# Patient Record
Sex: Female | Born: 1980 | Race: Asian | Hispanic: No | Marital: Married | Smoking: Never smoker
Health system: Southern US, Community
[De-identification: ages and names within clinical notes are randomized; demographics above are authoritative.]

## PROBLEM LIST (undated history)

## (undated) ENCOUNTER — Emergency Department (HOSPITAL_COMMUNITY): Admission: EM | Payer: BC Managed Care – PPO

## (undated) DIAGNOSIS — T783XXA Angioneurotic edema, initial encounter: Secondary | ICD-10-CM

## (undated) DIAGNOSIS — D219 Benign neoplasm of connective and other soft tissue, unspecified: Secondary | ICD-10-CM

## (undated) DIAGNOSIS — L309 Dermatitis, unspecified: Secondary | ICD-10-CM

## (undated) DIAGNOSIS — K8020 Calculus of gallbladder without cholecystitis without obstruction: Secondary | ICD-10-CM

## (undated) DIAGNOSIS — N939 Abnormal uterine and vaginal bleeding, unspecified: Secondary | ICD-10-CM

## (undated) DIAGNOSIS — T7840XA Allergy, unspecified, initial encounter: Secondary | ICD-10-CM

## (undated) DIAGNOSIS — E079 Disorder of thyroid, unspecified: Secondary | ICD-10-CM

## (undated) DIAGNOSIS — L509 Urticaria, unspecified: Secondary | ICD-10-CM

## (undated) HISTORY — PX: PELVIC FRACTURE SURGERY: SHX119

## (undated) HISTORY — DX: Angioneurotic edema, initial encounter: T78.3XXA

## (undated) HISTORY — DX: Urticaria, unspecified: L50.9

## (undated) HISTORY — PX: FRACTURE SURGERY: SHX138

## (undated) HISTORY — DX: Dermatitis, unspecified: L30.9

## (undated) HISTORY — DX: Allergy, unspecified, initial encounter: T78.40XA

## (undated) HISTORY — DX: Benign neoplasm of connective and other soft tissue, unspecified: D21.9

## (undated) HISTORY — DX: Abnormal uterine and vaginal bleeding, unspecified: N93.9

## (undated) HISTORY — DX: Disorder of thyroid, unspecified: E07.9

---

## 2000-06-19 HISTORY — PX: PELVIC FRACTURE SURGERY: SHX119

## 2013-09-01 ENCOUNTER — Emergency Department (HOSPITAL_COMMUNITY): Payer: Self-pay

## 2013-09-01 ENCOUNTER — Encounter (HOSPITAL_COMMUNITY): Payer: Self-pay | Admitting: Emergency Medicine

## 2013-09-01 ENCOUNTER — Emergency Department (HOSPITAL_COMMUNITY)
Admission: EM | Admit: 2013-09-01 | Discharge: 2013-09-01 | Disposition: A | Discharge status: Home / Self Care | Payer: Self-pay | Attending: Emergency Medicine | Admitting: Emergency Medicine

## 2013-09-01 DIAGNOSIS — Z3202 Encounter for pregnancy test, result negative: Secondary | ICD-10-CM | POA: Insufficient documentation

## 2013-09-01 DIAGNOSIS — K8020 Calculus of gallbladder without cholecystitis without obstruction: Secondary | ICD-10-CM | POA: Insufficient documentation

## 2013-09-01 DIAGNOSIS — Z9889 Other specified postprocedural states: Secondary | ICD-10-CM | POA: Insufficient documentation

## 2013-09-01 DIAGNOSIS — Z79899 Other long term (current) drug therapy: Secondary | ICD-10-CM | POA: Insufficient documentation

## 2013-09-01 LAB — CBC WITH DIFFERENTIAL/PLATELET
BASOS ABS: 0 10*3/uL (ref 0.0–0.1)
Basophils Relative: 0 % (ref 0–1)
Eosinophils Absolute: 0.1 10*3/uL (ref 0.0–0.7)
Eosinophils Relative: 0 % (ref 0–5)
HEMATOCRIT: 38.8 % (ref 36.0–46.0)
Hemoglobin: 12.8 g/dL (ref 12.0–15.0)
LYMPHS PCT: 25 % (ref 12–46)
Lymphs Abs: 3.3 10*3/uL (ref 0.7–4.0)
MCH: 27.8 pg (ref 26.0–34.0)
MCHC: 33 g/dL (ref 30.0–36.0)
MCV: 84.2 fL (ref 78.0–100.0)
MONO ABS: 0.6 10*3/uL (ref 0.1–1.0)
Monocytes Relative: 4 % (ref 3–12)
Neutro Abs: 9.1 10*3/uL — ABNORMAL HIGH (ref 1.7–7.7)
Neutrophils Relative %: 70 % (ref 43–77)
PLATELETS: 328 10*3/uL (ref 150–400)
RBC: 4.61 MIL/uL (ref 3.87–5.11)
RDW: 13.4 % (ref 11.5–15.5)
WBC: 13 10*3/uL — ABNORMAL HIGH (ref 4.0–10.5)

## 2013-09-01 LAB — COMPREHENSIVE METABOLIC PANEL
ALT: 18 U/L (ref 0–35)
AST: 17 U/L (ref 0–37)
Albumin: 4 g/dL (ref 3.5–5.2)
Alkaline Phosphatase: 60 U/L (ref 39–117)
BUN: 9 mg/dL (ref 6–23)
CO2: 28 mEq/L (ref 19–32)
Calcium: 9.7 mg/dL (ref 8.4–10.5)
Chloride: 102 mEq/L (ref 96–112)
Creatinine, Ser: 0.6 mg/dL (ref 0.50–1.10)
GFR calc Af Amer: >90 mL/min (ref >90)
GFR calc non Af Amer: >90 mL/min (ref >90)
Glucose, Bld: 120 mg/dL — ABNORMAL HIGH (ref 70–99)
POTASSIUM: 3.8 meq/L (ref 3.7–5.3)
Sodium: 140 mEq/L (ref 137–147)
TOTAL PROTEIN: 8.3 g/dL (ref 6.0–8.3)
Total Bilirubin: 0.3 mg/dL (ref 0.3–1.2)

## 2013-09-01 LAB — URINALYSIS, ROUTINE W REFLEX MICROSCOPIC
Bilirubin Urine: NEGATIVE
Glucose, UA: NEGATIVE mg/dL
Ketones, ur: NEGATIVE mg/dL
LEUKOCYTES UA: NEGATIVE
NITRITE: NEGATIVE
PROTEIN: NEGATIVE mg/dL
Specific Gravity, Urine: <1.005 — ABNORMAL LOW (ref 1.005–1.030)
UROBILINOGEN UA: 0.2 mg/dL (ref 0.0–1.0)
pH: 7 (ref 5.0–8.0)

## 2013-09-01 LAB — URINE MICROSCOPIC-ADD ON

## 2013-09-01 LAB — POC URINE PREG, ED: PREG TEST UR: NEGATIVE

## 2013-09-01 LAB — LIPASE, BLOOD: LIPASE: 24 U/L (ref 11–59)

## 2013-09-01 MED ORDER — MORPHINE SULFATE 4 MG/ML IJ SOLN
4.0000 mg | Freq: Once | INTRAMUSCULAR | Status: AC
  Administered 2013-09-01: 4 mg via INTRAVENOUS
  Filled 2013-09-01: qty 1

## 2013-09-01 MED ORDER — PANTOPRAZOLE SODIUM 40 MG IV SOLR
40.0000 mg | Freq: Once | INTRAVENOUS | Status: AC
  Administered 2013-09-01: 40 mg via INTRAVENOUS
  Filled 2013-09-01: qty 40

## 2013-09-01 MED ORDER — ONDANSETRON 8 MG PO TBDP
8.0000 mg | ORAL_TABLET | Freq: Once | ORAL | Status: AC
  Administered 2013-09-01: 8 mg via ORAL
  Filled 2013-09-01: qty 1

## 2013-09-01 MED ORDER — ONDANSETRON HCL 8 MG PO TABS: 8.0000 mg | ORAL_TABLET | Freq: Three times a day (TID) | ORAL | Status: DC | PRN

## 2013-09-01 MED ORDER — SODIUM CHLORIDE 0.9 % IV BOLUS (SEPSIS)
500.0000 mL | Freq: Once | INTRAVENOUS | Status: AC
  Administered 2013-09-01: 500 mL via INTRAVENOUS

## 2013-09-01 MED ORDER — OXYCODONE-ACETAMINOPHEN 5-325 MG PO TABS: 1.0000 | ORAL_TABLET | ORAL | Status: DC | PRN

## 2013-09-01 MED ORDER — CIPROFLOXACIN HCL 500 MG PO TABS: 500.0000 mg | ORAL_TABLET | Freq: Two times a day (BID) | ORAL | Status: DC

## 2013-09-01 NOTE — Care Management Note (Signed)
ED/CM noted patient did not have health insurance and/or PCP listed in the computer.  Patient was given the [place] resource handout with information on the clinics, food pantries, and the handout for new health insurance sign-up.  Patient expressed appreciation for information received. 

## 2013-09-01 NOTE — ED Notes (Signed)
Pt c/o intermittent mid-upper abdominal pain along with nausea and vomiting x2 weeks. Pt describes pain as sharp.

## 2013-09-01 NOTE — ED Provider Notes (Signed)
CSN: [id number]     Arrival date & time 09/01/13  1057 History   This chart was scribed for Traci Cable, MD by Era Bumpers, ED scribe. This patient was seen in room APA08/APA08 and the patient's care was started at 1057.  Chief Complaint  Patient presents with  . Abdominal Pain   The history is provided by the patient. No language interpreter was used.   HPI Comments: Traci Schmidt is a 33 y.o. female who presents to the Emergency Department for intermittent epigastric abdominal pain, ongoing for x15 days but worsened over the last x2 days. She states her abdominal pain is worse during the night. She states pain is not worse after eating food. She reports associated emesis and back pain. She denies any hx of Gallbladder issues. She states no relief w/nexium. She denies any blood in stool or vomitus, denies SOB, dysuria, vaginal bleeding or d/c. She reports baseline constipation.  No medical hx  PMH - none  Past Surgical History  Procedure Laterality Date  . Cesarean section    . Pelvic fracture surgery     History reviewed. No pertinent family history. History  Substance Use Topics  . Smoking status: Never Smoker   . Smokeless tobacco: Not on file  . Alcohol Use: No   OB History   Grav Para Term Preterm Abortions TAB SAB Ect Mult Living                 Review of Systems  Constitutional: Negative for fever and chills.  Respiratory: Negative for cough and shortness of breath.   Cardiovascular: Negative for chest pain.  Gastrointestinal: Positive for nausea, vomiting and abdominal pain. Negative for diarrhea.  Genitourinary: Negative for vaginal bleeding and vaginal discharge.  Musculoskeletal: Negative for back pain.  All other systems reviewed and are negative.   Allergies  Review of patient's allergies indicates no known allergies.  Home Medications   Current Outpatient Rx  Name  Route  Sig  Dispense  Refill  . acetaminophen (TYLENOL) 500 MG tablet   Oral  Take 1,000 mg by mouth every 6 (six) hours as needed for moderate pain or headache.         . calcium carbonate (TUMS - DOSED IN MG ELEMENTAL CALCIUM) 500 MG chewable tablet   Oral   Chew 1 tablet by mouth 4 (four) times daily as needed for indigestion or heartburn.         . esomeprazole (NEXIUM) 20 MG capsule   Oral   Take 20 mg by mouth daily at 12 noon.         Marland Kitchen ibuprofen (ADVIL,MOTRIN) 200 MG tablet   Oral   Take 400 mg by mouth daily as needed for headache or moderate pain.         . Simethicone (GAS RELIEF PO)   Oral   Take 1 tablet by mouth 3 (three) times daily as needed (gas relief).         . ciprofloxacin (CIPRO) 500 MG tablet   Oral   Take 1 tablet (500 mg total) by mouth 2 (two) times daily. One po bid x 7 days   14 tablet   0   . ondansetron (ZOFRAN) 8 MG tablet   Oral   Take 1 tablet (8 mg total) by mouth every 8 (eight) hours as needed for nausea.   12 tablet   0   . oxyCODONE-acetaminophen (PERCOCET/ROXICET) 5-325 MG per tablet   Oral   Take 1  tablet by mouth every 4 (four) hours as needed for severe pain.   15 tablet   0     Triage Vitals: BP 137/88  Pulse 93  Temp(Src) 98.5 F (36.9 C) (Oral)  Resp 20  Ht 5\' 3"  (1.6 m)  Wt 159 lb (72.122 kg)  BMI 28.17 kg/m2  SpO2 97%  LMP 08/03/2013  Physical Exam CONSTITUTIONAL: Well developed/well nourished HEAD: Normocephalic/atraumatic EYES: EOMI/PERRL. No icterus ENMT: Mucous membranes moist NECK: supple no meningeal signs SPINE:entire spine nontender CV: S1/S2 noted, no murmurs/rubs/gallops noted LUNGS: Lungs are clear to auscultation bilaterally, no apparent distress ABDOMEN: soft, , no rebound or guarding. Moderate epigastric tenderness to palpation GU:no cva tenderness NEURO: Pt is awake/alert, moves all extremitiesx4 EXTREMITIES: pulses normal, full ROM SKIN: warm, color normal PSYCH: no abnormalities of mood noted  ED Course  Procedures  DIAGNOSTIC STUDIES: Oxygen  Saturation is 97% on room air, normal by my interpretation.    COORDINATION OF CARE: At 9323 AM Discussed treatment plan with patient which includes abdominal US. Patient agrees.   D/w dr Arnoldo Morale He reviewed chart/labs Since pt is improved, recommends pain meds, cipro for "elevated white count" and f/u in office this week for cholelithiasis  Pt/family agreeable with this plan We discussed strict return precautions   Labs Review Labs Reviewed  COMPREHENSIVE METABOLIC PANEL - Abnormal; Notable for the following:    Glucose, Bld 120 (*)    All other components within normal limits  CBC WITH DIFFERENTIAL - Abnormal; Notable for the following:    WBC 13.0 (*)    Neutro Abs 9.1 (*)    All other components within normal limits  URINALYSIS, ROUTINE W REFLEX MICROSCOPIC - Abnormal; Notable for the following:    Specific Gravity, Urine <1.005 (*)    Hgb urine dipstick TRACE (*)    All other components within normal limits  URINE MICROSCOPIC-ADD ON - Abnormal; Notable for the following:    Squamous Epithelial / LPF FEW (*)    Bacteria, UA FEW (*)    All other components within normal limits  LIPASE, BLOOD  POC URINE PREG, ED   Imaging Review US Abdomen Limited  09/01/2013   CLINICAL DATA:  Mid epigastric pain, right upper quadrant pain.  EXAM: US ABDOMEN LIMITED - RIGHT UPPER QUADRANT  COMPARISON:  None.  FINDINGS: Gallbladder:  Multiple multiple gallstones noted. Mild gallbladder wall thickening at 4 mm. Negative sonographic Murphy's sign. No pericholecystic fluid.  Common bile duct:  Diameter: Normal caliber, 4 mm  Liver:  No focal lesion identified. Within normal limits in parenchymal echogenicity.  IMPRESSION: Cholelithiasis. Slight gallbladder wall thickening could reflect chronic cholecystitis. No sonographic evidence of acute cholecystitis.   Electronically Signed   By: Rolm Baptise M.D.   On: 09/01/2013 12:25     MDM   Final diagnoses:  Cholelithiasis    Nursing notes  including past medical history and social history reviewed and considered in documentation Labs/vital reviewed and considered   I personally performed the services described in this documentation, which was scribed in my presence. The recorded information has been reviewed and is accurate.      Traci Cable, MD 09/01/13 [phone]

## 2013-09-01 NOTE — ED Notes (Signed)
Pt reports intermittent sharp epigastric pain x 2. States that episodes have become more intense and frequent over last couple days. Also reports vomiting, denies diarrhea and black or bloody stools.

## 2013-09-01 NOTE — Discharge Instructions (Signed)

## 2013-10-31 ENCOUNTER — Encounter (HOSPITAL_COMMUNITY): Payer: Self-pay | Admitting: Emergency Medicine

## 2013-10-31 ENCOUNTER — Emergency Department (HOSPITAL_COMMUNITY)
Admission: EM | Admit: 2013-10-31 | Discharge: 2013-10-31 | Disposition: A | Discharge status: Home / Self Care | Payer: Self-pay | Attending: Emergency Medicine | Admitting: Emergency Medicine

## 2013-10-31 DIAGNOSIS — Z3202 Encounter for pregnancy test, result negative: Secondary | ICD-10-CM | POA: Insufficient documentation

## 2013-10-31 DIAGNOSIS — K8020 Calculus of gallbladder without cholecystitis without obstruction: Secondary | ICD-10-CM | POA: Insufficient documentation

## 2013-10-31 DIAGNOSIS — Z79899 Other long term (current) drug therapy: Secondary | ICD-10-CM | POA: Insufficient documentation

## 2013-10-31 DIAGNOSIS — Z792 Long term (current) use of antibiotics: Secondary | ICD-10-CM | POA: Insufficient documentation

## 2013-10-31 DIAGNOSIS — R1110 Vomiting, unspecified: Secondary | ICD-10-CM | POA: Insufficient documentation

## 2013-10-31 DIAGNOSIS — K8050 Calculus of bile duct without cholangitis or cholecystitis without obstruction: Secondary | ICD-10-CM

## 2013-10-31 DIAGNOSIS — Z791 Long term (current) use of non-steroidal anti-inflammatories (NSAID): Secondary | ICD-10-CM | POA: Insufficient documentation

## 2013-10-31 HISTORY — DX: Calculus of gallbladder without cholecystitis without obstruction: K80.20

## 2013-10-31 LAB — BASIC METABOLIC PANEL
BUN: 10 mg/dL (ref 6–23)
CHLORIDE: 103 meq/L (ref 96–112)
CO2: 26 mEq/L (ref 19–32)
Calcium: 9 mg/dL (ref 8.4–10.5)
Creatinine, Ser: 0.66 mg/dL (ref 0.50–1.10)
GFR calc Af Amer: >90 mL/min (ref >90)
GFR calc non Af Amer: >90 mL/min (ref >90)
GLUCOSE: 113 mg/dL — AB (ref 70–99)
POTASSIUM: 4.2 meq/L (ref 3.7–5.3)
Sodium: 140 mEq/L (ref 137–147)

## 2013-10-31 LAB — CBC WITH DIFFERENTIAL/PLATELET
BASOS ABS: 0 10*3/uL (ref 0.0–0.1)
Basophils Relative: 0 % (ref 0–1)
EOS ABS: 0.1 10*3/uL (ref 0.0–0.7)
Eosinophils Relative: 1 % (ref 0–5)
HEMATOCRIT: 36.8 % (ref 36.0–46.0)
Hemoglobin: 12.1 g/dL (ref 12.0–15.0)
LYMPHS ABS: 3.5 10*3/uL (ref 0.7–4.0)
Lymphocytes Relative: 30 % (ref 12–46)
MCH: 27.7 pg (ref 26.0–34.0)
MCHC: 32.9 g/dL (ref 30.0–36.0)
MCV: 84.2 fL (ref 78.0–100.0)
Monocytes Absolute: 0.4 10*3/uL (ref 0.1–1.0)
Monocytes Relative: 3 % (ref 3–12)
Neutro Abs: 7.9 10*3/uL — ABNORMAL HIGH (ref 1.7–7.7)
Neutrophils Relative %: 66 % (ref 43–77)
PLATELETS: 323 10*3/uL (ref 150–400)
RBC: 4.37 MIL/uL (ref 3.87–5.11)
RDW: 13.3 % (ref 11.5–15.5)
WBC: 11.9 10*3/uL — ABNORMAL HIGH (ref 4.0–10.5)

## 2013-10-31 LAB — URINALYSIS, ROUTINE W REFLEX MICROSCOPIC
BILIRUBIN URINE: NEGATIVE
Glucose, UA: NEGATIVE mg/dL
Hgb urine dipstick: NEGATIVE
KETONES UR: NEGATIVE mg/dL
Leukocytes, UA: NEGATIVE
NITRITE: NEGATIVE
PH: 6.5 (ref 5.0–8.0)
Protein, ur: NEGATIVE mg/dL
Specific Gravity, Urine: 1.025 (ref 1.005–1.030)
Urobilinogen, UA: 0.2 mg/dL (ref 0.0–1.0)

## 2013-10-31 LAB — HEPATIC FUNCTION PANEL
ALBUMIN: 3.5 g/dL (ref 3.5–5.2)
ALK PHOS: 55 U/L (ref 39–117)
ALT: 20 U/L (ref 0–35)
AST: 17 U/L (ref 0–37)
BILIRUBIN TOTAL: 0.2 mg/dL — AB (ref 0.3–1.2)
Total Protein: 7.4 g/dL (ref 6.0–8.3)

## 2013-10-31 LAB — LIPASE, BLOOD: Lipase: 35 U/L (ref 11–59)

## 2013-10-31 LAB — PREGNANCY, URINE: PREG TEST UR: NEGATIVE

## 2013-10-31 MED ORDER — ONDANSETRON 4 MG PO TBDP: 4.0000 mg | ORAL_TABLET | Freq: Three times a day (TID) | ORAL | Status: DC | PRN

## 2013-10-31 MED ORDER — ONDANSETRON HCL 4 MG/2ML IJ SOLN
4.0000 mg | Freq: Once | INTRAMUSCULAR | Status: AC
  Administered 2013-10-31: 4 mg via INTRAVENOUS
  Filled 2013-10-31: qty 2

## 2013-10-31 MED ORDER — SODIUM CHLORIDE 0.9 % IV BOLUS (SEPSIS)
1000.0000 mL | Freq: Once | INTRAVENOUS | Status: AC
Start: 2013-10-31 — End: 2013-10-31
  Administered 2013-10-31: 1000 mL via INTRAVENOUS

## 2013-10-31 MED ORDER — SODIUM CHLORIDE 0.9 % IV SOLN
1000.0000 mL | Freq: Once | INTRAVENOUS | Status: AC
  Administered 2013-10-31: 1000 mL via INTRAVENOUS

## 2013-10-31 MED ORDER — OXYCODONE-ACETAMINOPHEN 5-325 MG PO TABS: 2.0000 | ORAL_TABLET | ORAL | Status: DC | PRN

## 2013-10-31 MED ORDER — HYDROMORPHONE HCL PF 1 MG/ML IJ SOLN
1.0000 mg | INTRAMUSCULAR | Status: AC | PRN
  Administered 2013-10-31 (×2): 1 mg via INTRAVENOUS
  Filled 2013-10-31 (×2): qty 1

## 2013-10-31 MED ORDER — MORPHINE SULFATE 4 MG/ML IJ SOLN
4.0000 mg | Freq: Once | INTRAMUSCULAR | Status: AC
  Administered 2013-10-31: 4 mg via INTRAVENOUS
  Filled 2013-10-31: qty 1

## 2013-10-31 NOTE — ED Provider Notes (Signed)
CSN: [id number]     Arrival date & time 10/31/13  0727 History  This chart was scribed for Tanna Furry, MD by Erling Conte, ED Scribe. This patient was seen in room APA09/APA09 and the patient's care was started at 8:25 AM.    Chief Complaint  Patient presents with  . Abdominal Pain      The history is provided by the patient. No language interpreter was used.    HPI Comments: Traci Schmidt is a 33 y.o. female with a h/o gallstones who presents to the Emergency Department complaining of gradually worsening, moderate, left sided abdominal pain that radiates to the center of her back. Patient states that she had an [place] two months ago and that it was positive for gallstones. She states that she followed up with a GI surgeon and he said that wanted to wait on surgery to see if the symptoms persisted or if she had another attack. Patient states that she had another attack about 15 days ago. She states that the abdominal pain has gotten worse over the past 2 days. Patient states that she is having associated yellow emesis, substernal pain and pain with inspiration. Patient states that she has been watching her diet and trying to eat foods to avoid any gallstone attacks. Patient states that her LNMP was 15 days ago and is unsure if she could be pregnant. Patient denies any shoulder pain.     Gastroenterologist- Dr. Loyola Mast     Past Medical History  Diagnosis Date  . Gallstones    Past Surgical History  Procedure Laterality Date  . Cesarean section    . Pelvic fracture surgery     No family history on file. History  Substance Use Topics  . Smoking status: Never Smoker   . Smokeless tobacco: Not on file  . Alcohol Use: No   OB History   Grav Para Term Preterm Abortions TAB SAB Ect Mult Living                 Review of Systems  Constitutional: Negative for fever, chills, diaphoresis, appetite change and fatigue.  HENT: Negative for mouth sores, sore throat and trouble  swallowing.   Eyes: Negative for visual disturbance.  Respiratory: Negative for cough, chest tightness, shortness of breath and wheezing.        Pain with inspiration  Cardiovascular: Negative for chest pain.  Gastrointestinal: Positive for vomiting and abdominal pain. Negative for nausea, diarrhea and abdominal distention.  Endocrine: Negative for polydipsia, polyphagia and polyuria.  Genitourinary: Negative for dysuria, frequency and hematuria.  Musculoskeletal: Negative for gait problem.  Skin: Negative for color change, pallor and rash.  Neurological: Negative for dizziness, syncope, light-headedness and headaches.  Hematological: Does not bruise/bleed easily.  Psychiatric/Behavioral: Negative for behavioral problems and confusion.      Allergies  Review of patient's allergies indicates no known allergies.  Home Medications   Prior to Admission medications   Medication Sig Start Date End Date Taking? Authorizing Provider  acetaminophen (TYLENOL) 500 MG tablet Take 1,000 mg by mouth every 6 (six) hours as needed for moderate pain or headache.    Historical Provider, MD  calcium carbonate (TUMS - DOSED IN MG ELEMENTAL CALCIUM) 500 MG chewable tablet Chew 1 tablet by mouth 4 (four) times daily as needed for indigestion or heartburn.    Historical Provider, MD  ciprofloxacin (CIPRO) 500 MG tablet Take 1 tablet (500 mg total) by mouth 2 (two) times daily. One po bid x 7  days 09/01/13   [name], MD  esomeprazole (NEXIUM) 20 MG capsule Take 20 mg by mouth daily at 12 noon.    Historical Provider, MD  ibuprofen (ADVIL,MOTRIN) 200 MG tablet Take 400 mg by mouth daily as needed for headache or moderate pain.    Historical Provider, MD  ondansetron (ZOFRAN) 8 MG tablet Take 1 tablet (8 mg total) by mouth every 8 (eight) hours as needed for nausea. 09/01/13   [name], MD  oxyCODONE-acetaminophen (PERCOCET/ROXICET) 5-325 MG per tablet Take 1 tablet by mouth every 4 (four) hours  as needed for severe pain. 09/01/13   [name], MD  Simethicone (GAS RELIEF PO) Take 1 tablet by mouth 3 (three) times daily as needed (gas relief).    Historical Provider, MD   Triage Vitals: BP 137/92  Pulse 74  Temp(Src) 97.7 F (36.5 C) (Oral)  Resp 16  Ht 5\' 3"  (1.6 m)  Wt 160 lb (72.576 kg)  BMI 28.35 kg/m2  SpO2 100%  LMP 10/10/2013  Physical Exam  Constitutional: She is oriented to person, place, and time. She appears well-developed and well-nourished. No distress.  HENT:  Head: Normocephalic.  Eyes: Conjunctivae are normal. Pupils are equal, round, and reactive to light. No scleral icterus.  Neck: Normal range of motion. Neck supple. No thyromegaly present.  Cardiovascular: Normal rate and regular rhythm.  Exam reveals no gallop and no friction rub.   No murmur heard. Pulmonary/Chest: Effort normal and breath sounds normal. No respiratory distress. She has no wheezes. She has no rales.  Abdominal: Soft. Bowel sounds are normal. She exhibits no distension. There is tenderness. There is positive Murphy's sign. There is no rebound.  RUQ tenderness  Musculoskeletal: Normal range of motion.  Neurological: She is alert and oriented to person, place, and time.  Skin: Skin is warm and dry. No rash noted.  Psychiatric: She has a normal mood and affect. Her behavior is normal.    ED Course  Procedures (including critical care time)  DIAGNOSTIC STUDIES: Oxygen Saturation is 100% on RA, normal by my interpretation.    COORDINATION OF CARE: 8:32 AM- Will order diagnostic lab work and Dilaudid injection, morphine injection, normal saline IV, and Zofran. Pt advised of plan for treatment and pt agrees.   9:18 AM- Will consult with Dr. Arnoldo Morale. Pt advised of plan for treatment and pt agrees.    Labs Review Labs Reviewed  CBC WITH DIFFERENTIAL - Abnormal; Notable for the following:    WBC 11.9 (*)    Neutro Abs 7.9 (*)    All other components within normal limits   BASIC METABOLIC PANEL - Abnormal; Notable for the following:    Glucose, Bld 113 (*)    All other components within normal limits  HEPATIC FUNCTION PANEL - Abnormal; Notable for the following:    Total Bilirubin 0.2 (*)    All other components within normal limits  PREGNANCY, URINE  URINALYSIS, ROUTINE W REFLEX MICROSCOPIC  LIPASE, BLOOD    Imaging Review No results found.   EKG Interpretation None      MDM   Final diagnoses:  Biliary colic   The patient is given IV pain meds and has had incomplete, but marked improvement of her discomfort. Discussed case with Dr. Arnoldo Morale. She had seen Dr. Arnoldo Morale a few months ago in the office. Dr. Arnoldo Morale states that he feels the patient could be discharged to follow up with him on Tuesday, 4 days from now. He will then  discuss scheduling outpatient cholecystectomy the patient. I discussed this with her her husband at length. Plan will be liquid bland boring low-fat diet. Return here for worsening symptoms including fever jaundice pain dark urine light stools or any other changes in the interval. Percocet for pain control Zofran for nausea and follow with Dr. Arnoldo Morale on Tuesday  I personally performed the services described in this documentation, which was scribed in my presence. The recorded information has been reviewed and is accurate.    Tanna Furry, MD 10/31/13 1021

## 2013-10-31 NOTE — ED Notes (Signed)
Pt verbalized almost immediate relief post pain med admin

## 2013-10-31 NOTE — Discharge Instructions (Signed)
Nonfat liquid diet. Pain medication as needed. Call today for a Tuesday appointment with Dr. Arnoldo Morale to schedule surgery for gallbladder removal.  Biliary Colic  Biliary colic is a steady or irregular pain in the upper abdomen. It is usually under the right side of the rib cage. It happens when gallstones interfere with the normal flow of bile from the gallbladder. Bile is a liquid that helps to digest fats. Bile is made in the liver and stored in the gallbladder. When you eat a meal, bile passes from the gallbladder through the cystic duct and the common bile duct into the small intestine. There, it mixes with partially digested food. If a gallstone blocks either of these ducts, the normal flow of bile is blocked. The muscle cells in the bile duct contract forcefully to try to move the stone. This causes the pain of biliary colic.  SYMPTOMS   A person with biliary colic usually complains of pain in the upper abdomen. This pain can be:  In the center of the upper abdomen just below the breastbone.  In the upper-right part of the abdomen, near the gallbladder and liver.  Spread back toward the right shoulder blade.  Nausea and vomiting.  The pain usually occurs after eating.  Biliary colic is usually triggered by the digestive system's demand for bile. The demand for bile is high after fatty meals. Symptoms can also occur when a person who has been fasting suddenly eats a very large meal. Most episodes of biliary colic pass after 1 to 5 hours. After the most intense pain passes, your abdomen may continue to ache mildly for about 24 hours. DIAGNOSIS  After you describe your symptoms, your caregiver will perform a physical exam. He or she will pay attention to the upper right portion of your belly (abdomen). This is the area of your liver and gallbladder. An ultrasound will help your caregiver look for gallstones. Specialized scans of the gallbladder may also be done. Blood tests may be done,  especially if you have fever or if your pain persists. PREVENTION  Biliary colic can be prevented by controlling the risk factors for gallstones. Some of these risk factors, such as heredity, increasing age, and pregnancy are a normal part of life. Obesity and a high-fat diet are risk factors you can change through a healthy lifestyle. Women going through menopause who take hormone replacement therapy (estrogen) are also more likely to develop biliary colic. TREATMENT   Pain medication may be prescribed.  You may be encouraged to eat a fat-free diet.  If the first episode of biliary colic is severe, or episodes of colic keep retuning, surgery to remove the gallbladder (cholecystectomy) is usually recommended. This procedure can be done through small incisions using an instrument called a laparoscope. The procedure often requires a brief stay in the hospital. Some people can leave the hospital the same day. It is the most widely used treatment in people troubled by painful gallstones. It is effective and safe, with no complications in more than 90% of cases.  If surgery cannot be done, medication that dissolves gallstones may be used. This medication is expensive and can take months or years to work. Only small stones will dissolve.  Rarely, medication to dissolve gallstones is combined with a procedure called shock-wave lithotripsy. This procedure uses carefully aimed shock waves to break up gallstones. In many people treated with this procedure, gallstones form again within a few years. PROGNOSIS  If gallstones block your cystic duct or  common bile duct, you are at risk for repeated episodes of biliary colic. There is also a 25% chance that you will develop a gallbladder infection(acute cholecystitis), or some other complication of gallstones within 10 to 20 years. If you have surgery, schedule it at a time that is convenient for you and at a time when you are not sick. HOME CARE INSTRUCTIONS    Drink plenty of clear fluids.  Avoid fatty, greasy or fried foods, or any foods that make your pain worse.  Take medications as directed. SEEK MEDICAL CARE IF:   You develop a fever over 100.5 F (38.1 C).  Your pain gets worse over time.  You develop nausea that prevents you from eating and drinking.  You develop vomiting. SEEK IMMEDIATE MEDICAL CARE IF:   You have continuous or severe belly (abdominal) pain which is not relieved with medications.  You develop nausea and vomiting which is not relieved with medications.  You have symptoms of biliary colic and you suddenly develop a fever and shaking chills. This may signal cholecystitis. Call your caregiver immediately.  You develop a yellow color to your skin or the white part of your eyes (jaundice). Document Released: 11/06/2005 Document Revised: 08/28/2011 Document Reviewed: 01/16/2008 [facility] Patient Information 2014 [place].

## 2013-10-31 NOTE — ED Notes (Signed)
Pt states that she is feeling like she is in a dream , but pain still present , although presently down to 6/10 . Pt slightly hypotensive at this time, so will not administer another dose. Pt also stated " i don't want to be completely knocked out " .

## 2013-10-31 NOTE — ED Notes (Signed)
Discussed pain medication at length. Pt verbalized understanding and went back over instructions.  Pt left ED via wheelchair with husband.. Husband stated that he had already called Dr Arnoldo Morale office and scheduled necessary appointment

## 2013-10-31 NOTE — Care Management Note (Signed)
ED/CM noted patient did not have health insurance and/or PCP listed in the computer.  Patient was given the [place] resource handout with information on the clinics, food pantries, and the handout for new health insurance sign-up.  Patient expressed appreciation for information received. 

## 2013-10-31 NOTE — ED Notes (Signed)
Recently been told she has gallstones and experiencing frequent attacks - most recent starting this morning with n/v and back pain.

## 2013-10-31 NOTE — ED Notes (Signed)
Pt instructed to remain NPO.

## 2013-11-02 ENCOUNTER — Encounter (HOSPITAL_COMMUNITY): Payer: Self-pay | Admitting: Pharmacy Technician

## 2013-11-03 ENCOUNTER — Encounter (HOSPITAL_COMMUNITY)
Admit: 2013-11-03 | Discharge: 2013-11-03 | Disposition: A | Discharge status: Home / Self Care | Payer: Self-pay | Attending: General Surgery | Admitting: General Surgery

## 2013-11-03 ENCOUNTER — Encounter (HOSPITAL_COMMUNITY): Payer: Self-pay

## 2013-11-03 DIAGNOSIS — Z01812 Encounter for preprocedural laboratory examination: Secondary | ICD-10-CM | POA: Insufficient documentation

## 2013-11-03 LAB — HCG, SERUM, QUALITATIVE: Preg, Serum: NEGATIVE

## 2013-11-03 NOTE — Patient Instructions (Signed)
Traci Schmidt  11/03/2013   Your procedure is scheduled on:   11/05/2013  Report to [facility] at  61  AM.  Call this number if you have problems the morning of surgery: [phone]   Remember:   Do not eat food or drink liquids after midnight.   Take these medicines the morning of surgery with A SIP OF WATER: nexium, zofran, percocet   Do not wear jewelry, make-up or nail polish.  Do not wear lotions, powders, or perfumes.   Do not shave 48 hours prior to surgery. Men may shave face and neck.  Do not bring valuables to the hospital.  [facility] is not responsible  for any belongings or valuables.               Contacts, dentures or bridgework may not be worn into surgery.  Leave suitcase in the car. After surgery it may be brought to your room.  For patients admitted to the hospital, discharge time is determined by your treatment team.               Patients discharged the day of surgery will not be allowed to drive home.  Name and phone number of your driver: family  Special Instructions: Shower using CHG 2 nights before surgery and the night before surgery.  If you shower the day of surgery use CHG.  Use special wash - you have one bottle of CHG for all showers.  You should use approximately 1/3 of the bottle for each shower.   Please read over the following fact sheets that you were given: Pain Booklet, Coughing and Deep Breathing, Surgical Site Infection Prevention, Anesthesia Post-op Instructions and Care and Recovery After Surgery Laparoscopic Cholecystectomy Laparoscopic cholecystectomy is surgery to remove the gallbladder. The gallbladder is located in the upper right part of the abdomen, behind the liver. It is a storage sac for bile produced in the liver. Bile aids in the digestion and absorption of fats. Cholecystectomy is often done for inflammation of the gallbladder (cholecystitis). This condition is usually caused by a buildup of gallstones (cholelithiasis) in  your gallbladder. Gallstones can block the flow of bile, resulting in inflammation and pain. In severe cases, emergency surgery may be required. When emergency surgery is not required, you will have time to prepare for the procedure. Laparoscopic surgery is an alternative to open surgery. Laparoscopic surgery has a shorter recovery time. Your common bile duct may also need to be examined during the procedure. If stones are found in the common bile duct, they may be removed. LET [facility] CARE PROVIDER KNOW ABOUT:  Any allergies you have.  All medicines you are taking, including vitamins, herbs, eye drops, creams, and over-the-counter medicines.  Previous problems you or members of your family have had with the use of anesthetics.  Any blood disorders you have.  Previous surgeries you have had.  Medical conditions you have. RISKS AND COMPLICATIONS Generally, this is a safe procedure. However, as with any procedure, complications can occur. Possible complications include:  Infection.  Damage to the common bile duct, nerves, arteries, veins, or other internal organs such as the stomach, liver, or intestines.  Bleeding.  A stone may remain in the common bile duct.  A bile leak from the cyst duct that is clipped when your gallbladder is removed.  The need to convert to open surgery, which requires a larger incision in the abdomen. This may be necessary if your surgeon thinks  it is not safe to continue with a laparoscopic procedure. BEFORE THE PROCEDURE  Ask your health care provider about changing or stopping any regular medicines. You will need to stop taking aspirin or blood thinners at least 5 days prior to surgery.  Do not eat or drink anything after midnight the night before surgery.  Let your health care provider know if you develop a cold or other infectious problem before surgery. PROCEDURE   You will be given medicine to make you sleep through the procedure (general  anesthetic). A breathing tube will be placed in your mouth.  When you are asleep, your surgeon will make several small cuts (incisions) in your abdomen.  A thin, lighted tube with a tiny camera on the end (laparoscope) is inserted through one of the small incisions. The camera on the laparoscope sends a picture to a TV screen in the operating room. This gives the surgeon a good view inside your abdomen.  A gas will be pumped into your abdomen. This expands your abdomen so that the surgeon has more room to perform the surgery.  Other tools needed for the procedure are inserted through the other incisions. The gallbladder is removed through one of the incisions.  After the removal of your gallbladder, the incisions will be closed with stitches, staples, or skin glue. AFTER THE PROCEDURE  You will be taken to a recovery area where your progress will be checked often.  You may be allowed to go home the same day if your pain is controlled and you can tolerate liquids. Document Released: 06/05/2005 Document Revised: 03/26/2013 Document Reviewed: 01/15/2013 [facility] Patient Information 2014 [place]. PATIENT INSTRUCTIONS POST-ANESTHESIA  IMMEDIATELY FOLLOWING SURGERY:  Do not drive or operate machinery for the first twenty four hours after surgery.  Do not make any important decisions for twenty four hours after surgery or while taking narcotic pain medications or sedatives.  If you develop intractable nausea and vomiting or a severe headache please notify your doctor immediately.  FOLLOW-UP:  Please make an appointment with your surgeon as instructed. You do not need to follow up with anesthesia unless specifically instructed to do so.  WOUND CARE INSTRUCTIONS (if applicable):  Keep a dry clean dressing on the anesthesia/puncture wound site if there is drainage.  Once the wound has quit draining you may leave it open to air.  Generally you should leave the bandage intact for twenty four  hours unless there is drainage.  If the epidural site drains for more than 36-48 hours please call the anesthesia department.  QUESTIONS?:  Please feel free to call your physician or the hospital operator if you have any questions, and they will be happy to assist you.

## 2013-11-03 NOTE — H&P (Signed)
  NTS SOAP Note  Vital Signs:  Vitals as of: 02/22/931: Systolic 671: Diastolic 74: Heart Rate 67: Temp 25F: Height 38ft 3in: Weight 159Lbs 0 Ounces: BMI 28.17  BMI : 28.17 kg/m2  Subjective: This 33 Years 77 Months old Female presents for of epigastric pain.  Occurred two weeks ago.  Went to ER, found to have gallstones.  No Murphy's sign.  Has been well since then.  No fever, chills, jaundice, fatty food intolerance.  No further episodes.  Pain was not in right upper quadrant.  Review of Symptoms:  Constitutional:unremarkable   Head:unremarkable    Eyes:unremarkable   Nose/Mouth/Throat:unremarkable Cardiovascular:  unremarkable   Respiratory:unremarkable   Gastrointestinal:  unremarkable   Genitourinary:unremarkable     Musculoskeletal:unremarkable   Skin:unremarkable Hematolgic/Lymphatic:unremarkable     Allergic/Immunologic:unremarkable     Past Medical History:    Reviewed  Past Medical History  Surgical History: c-section Medical Problems: none Allergies: nkda Medications: none   Social History:Reviewed  Social History  Preferred Language: Other Race:  Other Ethnicity: Not Hispanic / Latino Age: 33 Years 7 Months Marital Status:  M Alcohol: no Recreational drug(s): no   Smoking Status: Never smoker reviewed on 09/25/2013 Functional Status reviewed on 09/25/2013 ------------------------------------------------ Bathing: Normal Cooking: Normal Dressing: Normal Driving: Normal Eating: Normal Managing Meds: Normal Oral Care: Normal Shopping: Normal Toileting: Normal Transferring: Normal Walking: Normal Cognitive Status reviewed on 09/25/2013 ------------------------------------------------ Attention: Normal Decision Making: Normal Language: Normal Memory: Normal Motor: Normal Perception: Normal Problem Solving: Normal Visual and Spatial: Normal   Family History:  Reviewed  Family Health  History Mother, Living; Healthy;  Father, Living; Diabetes mellitus, unspecified type;     Objective Information: General:  Well appearing, well nourished in no distress.   no scleral icterus Heart:  RRR, no murmur or gallop.  Normal S1, S2.  No S3, S4.  Lungs:    CTA bilaterally, no wheezes, rhonchi, rales.  Breathing unlabored. Abdomen:Soft, NT/ND, normal bowel sounds, no HSM, no masses.  No peritoneal signs.  Assessment:Abdominal pain, gallstones  Diagnoses: 574.20 Calculus of gallbladder without mention of cholecystitis, without mention of obstruction  Procedures: 24580 - OFFICE OUTPATIENT NEW 20 MINUTES    Plan:  Episode atypical for biliary colic.  Did talk to patient about signs of biliary colic.  No need for surgery at this time.  Patient understands and agrees.  Will follow expectantly.    Follow-up:PRN

## 2013-11-05 ENCOUNTER — Encounter (HOSPITAL_COMMUNITY)
Admission: RE | Disposition: A | Discharge status: Home / Self Care | Payer: Self-pay | Source: Ambulatory Visit | Attending: General Surgery

## 2013-11-05 ENCOUNTER — Encounter (HOSPITAL_COMMUNITY): Payer: Self-pay | Admitting: Anesthesiology

## 2013-11-05 ENCOUNTER — Encounter (HOSPITAL_COMMUNITY): Payer: Self-pay | Admitting: *Deleted

## 2013-11-05 ENCOUNTER — Ambulatory Visit (HOSPITAL_COMMUNITY): Payer: Self-pay | Admitting: Anesthesiology

## 2013-11-05 ENCOUNTER — Ambulatory Visit (HOSPITAL_COMMUNITY)
Admission: RE | Admit: 2013-11-05 | Discharge: 2013-11-05 | Disposition: A | Discharge status: Home / Self Care | Payer: Self-pay | Source: Ambulatory Visit | Attending: General Surgery | Admitting: General Surgery

## 2013-11-05 DIAGNOSIS — K8010 Calculus of gallbladder with chronic cholecystitis without obstruction: Secondary | ICD-10-CM | POA: Insufficient documentation

## 2013-11-05 HISTORY — PX: CHOLECYSTECTOMY: SHX55

## 2013-11-05 SURGERY — LAPAROSCOPIC CHOLECYSTECTOMY
Anesthesia: General | Site: Abdomen

## 2013-11-05 MED ORDER — BUPIVACAINE HCL (PF) 0.5 % IJ SOLN
INTRAMUSCULAR | Status: DC | PRN
  Administered 2013-11-05: 10 mL

## 2013-11-05 MED ORDER — LIDOCAINE HCL (PF) 1 % IJ SOLN
INTRAMUSCULAR | Status: AC
  Filled 2013-11-05: qty 5

## 2013-11-05 MED ORDER — ROCURONIUM BROMIDE 50 MG/5ML IV SOLN
INTRAVENOUS | Status: AC
  Filled 2013-11-05: qty 1

## 2013-11-05 MED ORDER — PROMETHAZINE HCL 25 MG/ML IJ SOLN
INTRAMUSCULAR | Status: AC
  Filled 2013-11-05: qty 1

## 2013-11-05 MED ORDER — POVIDONE-IODINE 10 % OINT PACKET
TOPICAL_OINTMENT | CUTANEOUS | Status: DC | PRN
  Administered 2013-11-05: 1 via TOPICAL

## 2013-11-05 MED ORDER — BUPIVACAINE HCL (PF) 0.5 % IJ SOLN
INTRAMUSCULAR | Status: AC
  Filled 2013-11-05: qty 30

## 2013-11-05 MED ORDER — SUCCINYLCHOLINE CHLORIDE 20 MG/ML IJ SOLN
INTRAMUSCULAR | Status: AC
  Filled 2013-11-05: qty 1

## 2013-11-05 MED ORDER — PROPOFOL 10 MG/ML IV BOLUS
INTRAVENOUS | Status: DC | PRN
  Administered 2013-11-05: 130 mg via INTRAVENOUS

## 2013-11-05 MED ORDER — PROMETHAZINE HCL 25 MG/ML IJ SOLN
6.2500 mg | Freq: Once | INTRAMUSCULAR | Status: AC
  Administered 2013-11-05: 6.25 mg via INTRAVENOUS

## 2013-11-05 MED ORDER — ROCURONIUM BROMIDE 100 MG/10ML IV SOLN
INTRAVENOUS | Status: DC | PRN
  Administered 2013-11-05: 15 mg via INTRAVENOUS
  Administered 2013-11-05: 5 mg via INTRAVENOUS

## 2013-11-05 MED ORDER — LACTATED RINGERS IV SOLN
INTRAVENOUS | Status: DC
  Administered 2013-11-05 (×2): via INTRAVENOUS

## 2013-11-05 MED ORDER — DEXAMETHASONE SODIUM PHOSPHATE 4 MG/ML IJ SOLN
4.0000 mg | Freq: Once | INTRAMUSCULAR | Status: AC
  Administered 2013-11-05: 4 mg via INTRAVENOUS

## 2013-11-05 MED ORDER — POVIDONE-IODINE 10 % EX OINT
TOPICAL_OINTMENT | CUTANEOUS | Status: AC
  Filled 2013-11-05: qty 2

## 2013-11-05 MED ORDER — HEMOSTATIC AGENTS (NO CHARGE) OPTIME
TOPICAL | Status: DC | PRN
  Administered 2013-11-05: 1 via TOPICAL

## 2013-11-05 MED ORDER — FENTANYL CITRATE 0.05 MG/ML IJ SOLN
INTRAMUSCULAR | Status: AC
  Filled 2013-11-05: qty 2

## 2013-11-05 MED ORDER — SODIUM CHLORIDE 0.9 % IR SOLN
Status: DC | PRN
  Administered 2013-11-05: 1000 mL

## 2013-11-05 MED ORDER — NEOSTIGMINE METHYLSULFATE 10 MG/10ML IV SOLN
INTRAVENOUS | Status: AC
  Filled 2013-11-05: qty 1

## 2013-11-05 MED ORDER — GLYCOPYRROLATE 0.2 MG/ML IJ SOLN
INTRAMUSCULAR | Status: DC | PRN
  Administered 2013-11-05: .5 mg via INTRAVENOUS

## 2013-11-05 MED ORDER — FENTANYL CITRATE 0.05 MG/ML IJ SOLN
INTRAMUSCULAR | Status: AC
  Filled 2013-11-05: qty 5

## 2013-11-05 MED ORDER — CHLORHEXIDINE GLUCONATE 4 % EX LIQD: 1.0000 "application " | Freq: Once | CUTANEOUS | Status: DC

## 2013-11-05 MED ORDER — DEXAMETHASONE SODIUM PHOSPHATE 4 MG/ML IJ SOLN
INTRAMUSCULAR | Status: AC
  Filled 2013-11-05: qty 1

## 2013-11-05 MED ORDER — ONDANSETRON HCL 4 MG/2ML IJ SOLN
4.0000 mg | Freq: Once | INTRAMUSCULAR | Status: AC
  Administered 2013-11-05: 4 mg via INTRAVENOUS

## 2013-11-05 MED ORDER — SUCCINYLCHOLINE CHLORIDE 20 MG/ML IJ SOLN
INTRAMUSCULAR | Status: DC | PRN
  Administered 2013-11-05: 100 mg via INTRAVENOUS

## 2013-11-05 MED ORDER — OXYCODONE-ACETAMINOPHEN 5-325 MG PO TABS: 1.0000 | ORAL_TABLET | ORAL | Status: DC | PRN

## 2013-11-05 MED ORDER — ONDANSETRON HCL 4 MG/2ML IJ SOLN
4.0000 mg | Freq: Once | INTRAMUSCULAR | Status: AC | PRN
  Administered 2013-11-05: 4 mg via INTRAVENOUS
  Filled 2013-11-05: qty 2

## 2013-11-05 MED ORDER — GLYCOPYRROLATE 0.2 MG/ML IJ SOLN
0.2000 mg | Freq: Once | INTRAMUSCULAR | Status: AC
  Administered 2013-11-05: 0.2 mg via INTRAVENOUS
  Filled 2013-11-05: qty 1

## 2013-11-05 MED ORDER — ONDANSETRON HCL 4 MG/2ML IJ SOLN
INTRAMUSCULAR | Status: AC
  Filled 2013-11-05: qty 2

## 2013-11-05 MED ORDER — FENTANYL CITRATE 0.05 MG/ML IJ SOLN
25.0000 ug | INTRAMUSCULAR | Status: DC | PRN
Start: 2013-11-05 — End: 2013-11-05
  Administered 2013-11-05 (×2): 50 ug via INTRAVENOUS

## 2013-11-05 MED ORDER — FENTANYL CITRATE 0.05 MG/ML IJ SOLN
INTRAMUSCULAR | Status: DC | PRN
  Administered 2013-11-05 (×7): 50 ug via INTRAVENOUS

## 2013-11-05 MED ORDER — MIDAZOLAM HCL 2 MG/2ML IJ SOLN
1.0000 mg | INTRAMUSCULAR | Status: DC | PRN
  Administered 2013-11-05: 2 mg via INTRAVENOUS

## 2013-11-05 MED ORDER — GLYCOPYRROLATE 0.2 MG/ML IJ SOLN
INTRAMUSCULAR | Status: AC
  Filled 2013-11-05: qty 2

## 2013-11-05 MED ORDER — SODIUM CHLORIDE 0.9 % IJ SOLN
INTRAMUSCULAR | Status: AC
  Filled 2013-11-05: qty 10

## 2013-11-05 MED ORDER — LIDOCAINE HCL 1 % IJ SOLN
INTRAMUSCULAR | Status: DC | PRN
  Administered 2013-11-05: 40 mg via INTRADERMAL

## 2013-11-05 MED ORDER — KETOROLAC TROMETHAMINE 30 MG/ML IJ SOLN
INTRAMUSCULAR | Status: AC
  Filled 2013-11-05: qty 1

## 2013-11-05 MED ORDER — PROPOFOL 10 MG/ML IV BOLUS
INTRAVENOUS | Status: AC
  Filled 2013-11-05: qty 20

## 2013-11-05 MED ORDER — NEOSTIGMINE METHYLSULFATE 10 MG/10ML IV SOLN
INTRAVENOUS | Status: DC | PRN
  Administered 2013-11-05: 3 mg via INTRAVENOUS

## 2013-11-05 MED ORDER — MIDAZOLAM HCL 2 MG/2ML IJ SOLN
INTRAMUSCULAR | Status: AC
  Filled 2013-11-05: qty 2

## 2013-11-05 MED ORDER — KETOROLAC TROMETHAMINE 30 MG/ML IJ SOLN
30.0000 mg | Freq: Once | INTRAMUSCULAR | Status: AC
  Administered 2013-11-05: 30 mg via INTRAVENOUS

## 2013-11-05 MED ORDER — CIPROFLOXACIN IN D5W 400 MG/200ML IV SOLN
400.0000 mg | INTRAVENOUS | Status: AC
  Administered 2013-11-05: 400 mg via INTRAVENOUS
  Filled 2013-11-05: qty 200

## 2013-11-05 SURGICAL SUPPLY — 39 items
APPLIER CLIP LAPSCP 10X32 DD (CLIP) ×2 IMPLANT
BAG HAMPER (MISCELLANEOUS) ×2 IMPLANT
CLOTH BEACON ORANGE TIMEOUT ST (SAFETY) ×2 IMPLANT
COVER LIGHT HANDLE STERIS (MISCELLANEOUS) ×4 IMPLANT
DECANTER SPIKE VIAL GLASS SM (MISCELLANEOUS) ×2 IMPLANT
DURAPREP 26ML APPLICATOR (WOUND CARE) ×2 IMPLANT
ELECT REM PT RETURN 9FT ADLT (ELECTROSURGICAL) ×2
ELECTRODE REM PT RTRN 9FT ADLT (ELECTROSURGICAL) ×1 IMPLANT
FILTER SMOKE EVAC LAPAROSHD (FILTER) ×2 IMPLANT
FORMALIN 10 PREFIL 120ML (MISCELLANEOUS) ×2 IMPLANT
GLOVE BIO SURGEON STRL SZ 6.5 (GLOVE) ×2 IMPLANT
GLOVE BIOGEL PI IND STRL 7.0 (GLOVE) ×2 IMPLANT
GLOVE BIOGEL PI INDICATOR 7.0 (GLOVE) ×2
GLOVE ECLIPSE 6.5 STRL STRAW (GLOVE) ×2 IMPLANT
GLOVE SURG SS PI 7.5 STRL IVOR (GLOVE) ×4 IMPLANT
GOWN STRL REUS W/TWL LRG LVL3 (GOWN DISPOSABLE) ×6 IMPLANT
HEMOSTAT SNOW SURGICEL 2X4 (HEMOSTASIS) ×2 IMPLANT
INST SET LAPROSCOPIC AP (KITS) ×2 IMPLANT
IV NS IRRIG 3000ML ARTHROMATIC (IV SOLUTION) IMPLANT
KIT ROOM TURNOVER APOR (KITS) ×2 IMPLANT
MANIFOLD NEPTUNE II (INSTRUMENTS) ×2 IMPLANT
NEEDLE INSUFFLATION 14GA 120MM (NEEDLE) ×2 IMPLANT
NS IRRIG 1000ML POUR BTL (IV SOLUTION) ×2 IMPLANT
PACK LAP CHOLE LZT030E (CUSTOM PROCEDURE TRAY) ×2 IMPLANT
PAD ARMBOARD 7.5X6 YLW CONV (MISCELLANEOUS) ×2 IMPLANT
POUCH SPECIMEN RETRIEVAL 10MM (ENDOMECHANICALS) ×2 IMPLANT
SET BASIN LINEN APH (SET/KITS/TRAYS/PACK) ×2 IMPLANT
SET TUBE IRRIG SUCTION NO TIP (IRRIGATION / IRRIGATOR) IMPLANT
SLEEVE ENDOPATH XCEL 5M (ENDOMECHANICALS) ×2 IMPLANT
SPONGE GAUZE 2X2 8PLY STRL LF (GAUZE/BANDAGES/DRESSINGS) ×8 IMPLANT
STAPLER VISISTAT (STAPLE) ×2 IMPLANT
SUT VICRYL 0 UR6 27IN ABS (SUTURE) ×2 IMPLANT
TAPE CLOTH SURG 4X10 WHT LF (GAUZE/BANDAGES/DRESSINGS) ×2 IMPLANT
TROCAR ENDO BLADELESS 11MM (ENDOMECHANICALS) ×2 IMPLANT
TROCAR XCEL NON-BLD 5MMX100MML (ENDOMECHANICALS) ×2 IMPLANT
TROCAR XCEL UNIV SLVE 11M 100M (ENDOMECHANICALS) ×2 IMPLANT
TUBING INSUFFLATION (TUBING) ×2 IMPLANT
WARMER LAPAROSCOPE (MISCELLANEOUS) ×2 IMPLANT
YANKAUER SUCT 12FT TUBE ARGYLE (SUCTIONS) ×2 IMPLANT

## 2013-11-05 NOTE — Discharge Instructions (Signed)
Laparoscopic Cholecystectomy, Care After °Refer to this sheet in the next few weeks. These instructions provide you with information on caring for yourself after your procedure. Your health care provider may also give you more specific instructions. Your treatment has been planned according to current medical practices, but problems sometimes occur. Call your health care provider if you have any problems or questions after your procedure. °WHAT TO EXPECT AFTER THE PROCEDURE °After your procedure, it is typical to have the following: °· Pain at your incision sites. You will be given pain medicines to control the pain. °· Mild nausea or vomiting. This should improve after the first 24 hours. °· Bloating and possibly shoulder pain from the gas used during the procedure. This will improve after the first 24 hours. °HOME CARE INSTRUCTIONS  °· Change bandages (dressings) as directed by your health care provider. °· Keep the wound dry and clean. You may wash the wound gently with soap and water. Gently blot or dab the area dry. °· Do not take baths or use swimming pools or hot tubs for 2 weeks or until your health care provider approves. °· Only take over-the-counter or prescription medicines as directed by your health care provider. °· Continue your normal diet as directed by your health care provider. °· Do not lift anything heavier than 10 pounds (4.5 kg) until your health care provider approves. °· Do not play contact sports for 1 week or until your health care provider approves. °SEEK MEDICAL CARE IF:  °· You have redness, swelling, or increasing pain in the wound. °· You notice yellowish-white fluid (pus) coming from the wound. °· You have drainage from the wound that lasts longer than 1 day. °· You notice a bad smell coming from the wound or dressing. °· Your surgical cuts (incisions) break open. °SEEK IMMEDIATE MEDICAL CARE IF:  °· You develop a rash. °· You have difficulty breathing. °· You have chest pain. °· You  have a fever. °· You have increasing pain in the shoulders (shoulder strap areas). °· You have dizzy episodes or faint while standing. °· You have severe abdominal pain. °· You feel sick to your stomach (nauseous) or throw up (vomit) and this lasts for more than 1 day. °Document Released: 06/05/2005 Document Revised: 03/26/2013 Document Reviewed: 01/15/2013 °[employer]® Patient Information ©2014 [employer]. ° °

## 2013-11-05 NOTE — Interval H&P Note (Signed)
History and Physical Interval Note:  11/05/2013 10:30 AM  Traci Schmidt  has presented today for surgery, with the diagnosis of cholelithiasis  The various methods of treatment have been discussed with the patient and family. After consideration of risks, benefits and other options for treatment, the patient has consented to  Procedure(s): LAPAROSCOPIC CHOLECYSTECTOMY (N/A) as a surgical intervention .  The patient's history has been reviewed, patient examined, no change in status, stable for surgery.  I have reviewed the patient's chart and labs.  Questions were answered to the patient's satisfaction.     Jamesetta So

## 2013-11-05 NOTE — Anesthesia Procedure Notes (Signed)
Procedure Name: Intubation Date/Time: 11/05/2013 11:03 AM Performed by: Tressie Stalker E Pre-anesthesia Checklist: Patient identified, Patient being monitored, Timeout performed, Emergency Drugs available and Suction available Patient Re-evaluated:Patient Re-evaluated prior to inductionOxygen Delivery Method: Circle System Utilized Preoxygenation: Pre-oxygenation with 100% oxygen Intubation Type: IV induction, Rapid sequence and Cricoid Pressure applied Ventilation: Mask ventilation without difficulty Laryngoscope Size: Mac and 3 Grade View: Grade I Tube type: Oral Tube size: 7.0 mm Number of attempts: 1 Airway Equipment and Method: stylet Placement Confirmation: ETT inserted through vocal cords under direct vision,  positive ETCO2 and breath sounds checked- equal and bilateral Secured at: 20 cm Tube secured with: Tape Dental Injury: Teeth and Oropharynx as per pre-operative assessment

## 2013-11-05 NOTE — Anesthesia Preprocedure Evaluation (Signed)
Anesthesia Evaluation  Patient identified by MRN, date of birth, ID band Patient awake    Reviewed: Allergy & Precautions, H&P , NPO status , Patient's Chart, lab work & pertinent test results  Airway Mallampati: II TM Distance: >3 FB Neck ROM: Full    Dental  (+) Teeth Intact   Pulmonary neg pulmonary ROS,  breath sounds clear to auscultation        Cardiovascular negative cardio ROS  Rhythm:Regular Rate:Normal     Neuro/Psych    GI/Hepatic GERD-  Medicated,  Endo/Other    Renal/GU      Musculoskeletal   Abdominal   Peds  Hematology   Anesthesia Other Findings   Reproductive/Obstetrics                           Anesthesia Physical Anesthesia Plan  ASA: II  Anesthesia Plan: General   Post-op Pain Management:    Induction: Intravenous, Rapid sequence and Cricoid pressure planned  Airway Management Planned: Oral ETT  Additional Equipment:   Intra-op Plan:   Post-operative Plan: Extubation in OR  Informed Consent: I have reviewed the patients History and Physical, chart, labs and discussed the procedure including the risks, benefits and alternatives for the proposed anesthesia with the patient or authorized representative who has indicated his/her understanding and acceptance.     Plan Discussed with:   Anesthesia Plan Comments:         Anesthesia Quick Evaluation

## 2013-11-05 NOTE — Anesthesia Postprocedure Evaluation (Signed)
  Anesthesia Post-op Note  Patient: Traci Schmidt  Procedure(s) Performed: Procedure(s): LAPAROSCOPIC CHOLECYSTECTOMY (N/A)  Patient Location: PACU  Anesthesia Type:General  Level of Consciousness: awake and alert   Airway and Oxygen Therapy: Patient Spontanous Breathing and Patient connected to face mask oxygen  Post-op Pain: mild  Post-op Assessment: Post-op Vital signs reviewed, Patient's Cardiovascular Status Stable, Respiratory Function Stable, Patent Airway and No signs of Nausea or vomiting  Post-op Vital Signs: Reviewed and stable  Last Vitals:  Filed Vitals:   11/05/13 0923  BP: 110/76  Pulse: 72  Temp: 73.5 C    Complications: No apparent anesthesia complications

## 2013-11-05 NOTE — Op Note (Signed)
Patient:  Traci Schmidt  DOB:  05/08/1981  MRN:  [id number]   Preop Diagnosis:  Cholecystitis, cholelithiasis  Postop Diagnosis:  Same  Procedure:  Laparoscopic cholecystectomy  Surgeon:  Aviva Signs, M.D.  Anes:  General endotracheal  Indications:  Patient is a 33 year old female who presents with biliary colic secondary to cholelithiasis. The risks and benefits of the procedure including bleeding, infection, hepatobiliary injury, the possibility of an open procedure were fully explained to the patient, who gave informed consent.  Procedure note:  The patient is placed the supine position. After induction of general anesthesia, the abdomen was prepped and draped using usual sterile technique with DuraPrep. Surgical site confirmation was performed.  An infraumbilical incision was made down to the fascia. A Veress needle was introduced into the abdominal cavity and confirmation of placement was done using the saline drop test. The abdomen was then insufflated to 16 mm mercury pressure. An 11 mm trocar was introduced into the abdominal cavity under direct visualization without difficulty. Patient was placed in reverse Trendelenburg position and additional 11 mm trocar was placed the epigastric region and 5 mm trochars were placed the right upper quadrant and right flank regions. The liver was inspected and noted to be within normal limits. The gallbladder was retracted in a dynamic fashion in order to expose the triangle of Calot. The cystic duct was first identified. Its juncture to the infundibulum was fully identified. Endoclips placed proximally distally on the cystic duct, and the cystic duct was divided. This was likewise done to the cystic artery. The gallbladder was then freed away from the gallbladder fossa using Bovie electrocautery. The gallbladder was delivered through the epigastric trocar site using an Endo Catch bag. The gallbladder fossa was inspected and no abnormal bleeding or  bile leakage was noted. Surgicel was placed in the gallbladder fossa. All fluid and air were then evacuated from the abdominal cavity prior to removal of the trochars.  All wounds were irrigated normal saline. All wounds were injected with 0.5% Sensorcaine. The infraumbilical fascia was reapproximated using an 0 Vicryl interrupted suture. All skin incisions were closed using staples. Betadine ointment and dry sterile dressings were applied.  All tape and needle counts were correct at the end of the procedure. Patient was extubated in the operating room and transferred to PACU in stable condition.  Complications:  None  EBL:  Minimal  Specimen:  Gallbladder

## 2013-11-05 NOTE — Transfer of Care (Signed)
Immediate Anesthesia Transfer of Care Note  Patient: Traci Schmidt  Procedure(s) Performed: Procedure(s): LAPAROSCOPIC CHOLECYSTECTOMY (N/A)  Patient Location: PACU  Anesthesia Type:General  Level of Consciousness: awake, alert  and oriented  Airway & Oxygen Therapy: Patient Spontanous Breathing and Patient connected to face mask oxygen  Post-op Assessment: Report given to PACU RN  Post vital signs: Reviewed and stable  Complications: No apparent anesthesia complications

## 2013-11-06 ENCOUNTER — Encounter (HOSPITAL_COMMUNITY): Payer: Self-pay | Admitting: General Surgery

## 2013-11-10 ENCOUNTER — Emergency Department (HOSPITAL_COMMUNITY): Payer: Self-pay

## 2013-11-10 ENCOUNTER — Inpatient Hospital Stay (HOSPITAL_COMMUNITY)
Admission: EM | Admit: 2013-11-10 | Discharge: 2013-11-13 | DRG: 446 | Disposition: A | Discharge status: Home / Self Care | Payer: Self-pay | Attending: General Surgery | Admitting: General Surgery

## 2013-11-10 ENCOUNTER — Encounter (HOSPITAL_COMMUNITY): Payer: Self-pay | Admitting: Emergency Medicine

## 2013-11-10 DIAGNOSIS — K8050 Calculus of bile duct without cholangitis or cholecystitis without obstruction: Secondary | ICD-10-CM

## 2013-11-10 DIAGNOSIS — K5710 Diverticulosis of small intestine without perforation or abscess without bleeding: Secondary | ICD-10-CM | POA: Diagnosis present

## 2013-11-10 DIAGNOSIS — Z9889 Other specified postprocedural states: Secondary | ICD-10-CM

## 2013-11-10 DIAGNOSIS — R748 Abnormal levels of other serum enzymes: Secondary | ICD-10-CM | POA: Diagnosis present

## 2013-11-10 DIAGNOSIS — K5900 Constipation, unspecified: Secondary | ICD-10-CM | POA: Diagnosis present

## 2013-11-10 DIAGNOSIS — R1013 Epigastric pain: Secondary | ICD-10-CM | POA: Diagnosis present

## 2013-11-10 DIAGNOSIS — R109 Unspecified abdominal pain: Secondary | ICD-10-CM | POA: Diagnosis present

## 2013-11-10 DIAGNOSIS — K8051 Calculus of bile duct without cholangitis or cholecystitis with obstruction: Principal | ICD-10-CM | POA: Diagnosis present

## 2013-11-10 DIAGNOSIS — R51 Headache: Secondary | ICD-10-CM | POA: Diagnosis present

## 2013-11-10 LAB — CBC WITH DIFFERENTIAL/PLATELET
BASOS PCT: 0 % (ref 0–1)
Basophils Absolute: 0 10*3/uL (ref 0.0–0.1)
EOS ABS: 0.1 10*3/uL (ref 0.0–0.7)
EOS PCT: 1 % (ref 0–5)
HEMATOCRIT: 38.3 % (ref 36.0–46.0)
Hemoglobin: 12.5 g/dL (ref 12.0–15.0)
Lymphocytes Relative: 29 % (ref 12–46)
Lymphs Abs: 2.3 10*3/uL (ref 0.7–4.0)
MCH: 27.1 pg (ref 26.0–34.0)
MCHC: 32.6 g/dL (ref 30.0–36.0)
MCV: 83.1 fL (ref 78.0–100.0)
MONO ABS: 0.6 10*3/uL (ref 0.1–1.0)
Monocytes Relative: 7 % (ref 3–12)
NEUTROS ABS: 4.8 10*3/uL (ref 1.7–7.7)
Neutrophils Relative %: 63 % (ref 43–77)
Platelets: 425 10*3/uL — ABNORMAL HIGH (ref 150–400)
RBC: 4.61 MIL/uL (ref 3.87–5.11)
RDW: 13.2 % (ref 11.5–15.5)
WBC: 7.8 10*3/uL (ref 4.0–10.5)

## 2013-11-10 LAB — COMPREHENSIVE METABOLIC PANEL
ALBUMIN: 3.8 g/dL (ref 3.5–5.2)
ALT: 906 U/L — ABNORMAL HIGH (ref 0–35)
AST: 552 U/L — ABNORMAL HIGH (ref 0–37)
Alkaline Phosphatase: 246 U/L — ABNORMAL HIGH (ref 39–117)
BILIRUBIN TOTAL: 2.6 mg/dL — AB (ref 0.3–1.2)
BUN: 5 mg/dL — AB (ref 6–23)
CALCIUM: 9.8 mg/dL (ref 8.4–10.5)
CHLORIDE: 95 meq/L — AB (ref 96–112)
CO2: 27 mEq/L (ref 19–32)
CREATININE: 0.62 mg/dL (ref 0.50–1.10)
GFR calc Af Amer: >90 mL/min (ref >90)
GFR calc non Af Amer: >90 mL/min (ref >90)
Glucose, Bld: 117 mg/dL — ABNORMAL HIGH (ref 70–99)
Potassium: 3.8 mEq/L (ref 3.7–5.3)
Sodium: 136 mEq/L — ABNORMAL LOW (ref 137–147)
Total Protein: 8.5 g/dL — ABNORMAL HIGH (ref 6.0–8.3)

## 2013-11-10 LAB — LIPASE, BLOOD: LIPASE: 24 U/L (ref 11–59)

## 2013-11-10 MED ORDER — DIPHENHYDRAMINE HCL 50 MG/ML IJ SOLN
25.0000 mg | Freq: Once | INTRAMUSCULAR | Status: AC
Start: 2013-11-10 — End: 2013-11-10
  Administered 2013-11-10: 25 mg via INTRAVENOUS
  Filled 2013-11-10: qty 1

## 2013-11-10 MED ORDER — PROMETHAZINE HCL 25 MG/ML IJ SOLN
25.0000 mg | Freq: Once | INTRAMUSCULAR | Status: AC
  Administered 2013-11-10: 25 mg via INTRAVENOUS
  Filled 2013-11-10: qty 1

## 2013-11-10 MED ORDER — IOHEXOL 300 MG/ML  SOLN
100.0000 mL | Freq: Once | INTRAMUSCULAR | Status: AC | PRN
  Administered 2013-11-10: 100 mL via INTRAVENOUS

## 2013-11-10 MED ORDER — IOHEXOL 300 MG/ML  SOLN
50.0000 mL | Freq: Once | INTRAMUSCULAR | Status: AC | PRN
  Administered 2013-11-10: 50 mL via ORAL

## 2013-11-10 MED ORDER — SODIUM CHLORIDE 0.9 % IV BOLUS (SEPSIS)
1000.0000 mL | Freq: Once | INTRAVENOUS | Status: AC
  Administered 2013-11-10: 1000 mL via INTRAVENOUS

## 2013-11-10 MED ORDER — DIPHENHYDRAMINE HCL 50 MG/ML IJ SOLN
25.0000 mg | Freq: Once | INTRAMUSCULAR | Status: AC
  Administered 2013-11-10: 25 mg via INTRAVENOUS
  Filled 2013-11-10: qty 1

## 2013-11-10 NOTE — ED Notes (Signed)
Had cholecystectomy on 5/20. Cont to have pain, N/V  Headache.

## 2013-11-10 NOTE — ED Provider Notes (Signed)
CSN: [id number]     Arrival date & time 11/10/13  1828 History   First MD Initiated Contact with Patient 11/10/13 1934   This chart was scribed for Traci Schmidt, * by Anastasia Pall, ED Scribe. This patient was seen in room APA16A/[facility]16A and the patient's care was started at 7:44 PM.   Chief Complaint  Patient presents with  . Abdominal Pain    (Consider location/radiation/quality/duration/timing/severity/associated sxs/prior Treatment) The history is provided by the patient. No language interpreter was used.    HPI Comments: Traci Schmidt is a 33 y.o. female with recent cholecystectomy 05/20, who presents to the Emergency Department complaining of intermittent abdominal pain, that intermittently radiates to her back, onset 5 days ago since her surgery. She reports associated nausea, vomiting, throbbing headache, and constipation. She denies current abdominal pain, just headache and nausea. She denies frequent headaches prior to her surgery. She is scheduled for staple removal tomorrow. She denies fever, and any other associated symptoms.   PCP - No PCP Per Patient  Past Medical History  Diagnosis Date  . Gallstones    Past Surgical History  Procedure Laterality Date  . Cesarean section    . Pelvic fracture surgery    . Cholecystectomy N/A 11/05/2013    Procedure: LAPAROSCOPIC CHOLECYSTECTOMY;  Surgeon: Jamesetta So, MD;  Location: [facility];  Service: General;  Laterality: N/A;   History reviewed. No pertinent family history. History  Substance Use Topics  . Smoking status: Never Smoker   . Smokeless tobacco: Not on file  . Alcohol Use: No   OB History   Grav Para Term Preterm Abortions TAB SAB Ect Mult Living                 Review of Systems  Constitutional: Negative for fever.  Gastrointestinal: Positive for nausea, vomiting, abdominal pain and constipation.  Musculoskeletal: Positive for back pain.  Neurological: Positive for headaches.  All other systems  reviewed and are negative.  Allergies  Review of patient's allergies indicates no known allergies.  Home Medications   Prior to Admission medications   Medication Sig Start Date End Date Taking? Authorizing Provider  esomeprazole (NEXIUM) 20 MG capsule Take 20 mg by mouth daily at 12 noon.    Historical Provider, MD  ibuprofen (ADVIL,MOTRIN) 200 MG tablet Take 400 mg by mouth daily as needed for headache or moderate pain.    Historical Provider, MD  ondansetron (ZOFRAN ODT) 4 MG disintegrating tablet Take 1 tablet (4 mg total) by mouth every 8 (eight) hours as needed for nausea. 10/31/13   Tanna Furry, MD  oxyCODONE-acetaminophen (PERCOCET/ROXICET) 5-325 MG per tablet Take 1-2 tablets by mouth every 4 (four) hours as needed. 11/05/13   Jamesetta So, MD   BP 127/87  Pulse 84  Temp(Src) 99 F (37.2 C) (Oral)  Resp 20  Ht 5\' 3"  (1.6 m)  Wt 160 lb (72.576 kg)  BMI 28.35 kg/m2  SpO2 99%  LMP 11/05/2013  Physical Exam  Constitutional: She is oriented to person, place, and time. She appears well-developed and well-nourished. No distress.  HENT:  Head: Normocephalic and atraumatic.  Right Ear: Hearing and external ear normal.  Left Ear: Hearing and external ear normal.  Nose: Nose normal.  Mouth/Throat: Mucous membranes are normal.  Eyes: Conjunctivae and EOM are normal. Pupils are equal, round, and reactive to light.  Neck: Neck supple.  Cardiovascular: Normal rate, S1 normal and S2 normal.   Pulmonary/Chest: Effort normal. No respiratory distress.  Abdominal:  Soft. Normal appearance. There is no hepatosplenomegaly. There is no tenderness. There is no rebound, no guarding, no tenderness at McBurney's point and negative Murphy's sign. No hernia.  Musculoskeletal: Normal range of motion.  Neurological: She is alert and oriented to person, place, and time. She has normal strength. No cranial nerve deficit or sensory deficit. Coordination normal. GCS eye subscore is 4. GCS verbal subscore  is 5. GCS motor subscore is 6.  Skin: Skin is warm, dry and intact. No rash noted. No cyanosis.  Psychiatric: She has a normal mood and affect. Her speech is normal and behavior is normal. Thought content normal.    ED Course  Procedures (including critical care time)  DIAGNOSTIC STUDIES: Oxygen Saturation is 99% on room air, normal by my interpretation.    COORDINATION OF CARE: 7:47 PM-Discussed treatment plan which includes IV fluids with pt at bedside and pt agreed to plan.     EKG Interpretation None     Medications  promethazine (PHENERGAN) injection 25 mg (25 mg Intravenous Given 11/10/13 1959)  diphenhydrAMINE (BENADRYL) injection 25 mg (25 mg Intravenous Given 11/10/13 1959)  sodium chloride 0.9 % bolus 1,000 mL (0 mLs Intravenous Stopped 11/10/13 2042)  iohexol (OMNIPAQUE) 300 MG/ML solution 50 mL (50 mLs Oral Contrast Given 11/10/13 2104)  diphenhydrAMINE (BENADRYL) injection 25 mg (25 mg Intravenous Given 11/10/13 2113)  iohexol (OMNIPAQUE) 300 MG/ML solution 100 mL (100 mLs Intravenous Contrast Given 11/10/13 2310)    Results for orders placed during the hospital encounter of 11/10/13  CBC WITH DIFFERENTIAL      Result Value Ref Range   WBC 7.8  4.0 - 10.5 K/uL   RBC 4.61  3.87 - 5.11 MIL/uL   Hemoglobin 12.5  12.0 - 15.0 g/dL   HCT 38.3  36.0 - 46.0 %   MCV 83.1  78.0 - 100.0 fL   MCH 27.1  26.0 - 34.0 pg   MCHC 32.6  30.0 - 36.0 g/dL   RDW 13.2  11.5 - 15.5 %   Platelets 425 (*) 150 - 400 K/uL   Neutrophils Relative % 63  43 - 77 %   Neutro Abs 4.8  1.7 - 7.7 K/uL   Lymphocytes Relative 29  12 - 46 %   Lymphs Abs 2.3  0.7 - 4.0 K/uL   Monocytes Relative 7  3 - 12 %   Monocytes Absolute 0.6  0.1 - 1.0 K/uL   Eosinophils Relative 1  0 - 5 %   Eosinophils Absolute 0.1  0.0 - 0.7 K/uL   Basophils Relative 0  0 - 1 %   Basophils Absolute 0.0  0.0 - 0.1 K/uL  COMPREHENSIVE METABOLIC PANEL      Result Value Ref Range   Sodium 136 (*) 137 - 147 mEq/L   Potassium  3.8  3.7 - 5.3 mEq/L   Chloride 95 (*) 96 - 112 mEq/L   CO2 27  19 - 32 mEq/L   Glucose, Bld 117 (*) 70 - 99 mg/dL   BUN 5 (*) 6 - 23 mg/dL   Creatinine, Ser 0.62  0.50 - 1.10 mg/dL   Calcium 9.8  8.4 - 10.5 mg/dL   Total Protein 8.5 (*) 6.0 - 8.3 g/dL   Albumin 3.8  3.5 - 5.2 g/dL   AST 552 (*) 0 - 37 U/L   ALT 906 (*) 0 - 35 U/L   Alkaline Phosphatase 246 (*) 39 - 117 U/L   Total Bilirubin 2.6 (*) 0.3 - 1.2  mg/dL   GFR calc non Af Amer >90  >90 mL/min   GFR calc Af Amer >90  >90 mL/min  LIPASE, BLOOD      Result Value Ref Range   Lipase 24  11 - 59 U/L   Ct Abdomen Pelvis W Contrast  11/10/2013   CLINICAL DATA:  Five days post cholecystectomy, abdominal pain, nausea, vomiting, constipation, question leak  EXAM: CT ABDOMEN AND PELVIS WITH CONTRAST  TECHNIQUE: Multidetector CT imaging of the abdomen and pelvis was performed using the standard protocol following bolus administration of intravenous contrast. Sagittal and coronal MPR images reconstructed from axial data set.  CONTRAST:  12mL OMNIPAQUE IOHEXOL 300 MG/ML SOLN ORALLY, 141mL OMNIPAQUE IOHEXOL 300 MG/ML SOLN IV  COMPARISON:  None  FINDINGS: Lung bases clear.  Surgical clips post cholecystectomy.  Heterogeneous collection containing foci of gas at gallbladder fossa compatible with typical postoperative packing with Surgicel.  No perihepatic free fluid is identified to suggest bile leak.  Liver, spleen, pancreas, kidneys, and adrenal glands normal appearance.  Anterior pelvic osseous deformities secondary to prior pelvic fractures and surgery.  Lowdescent of urinary bladder may be related to prior pelvic trauma but cannot exclude cystocele.  Normal appearing uterus and adnexae.  Appendix not visualized.  Scattered stool throughout colon.  Stomach and bowel loops otherwise normal appearance.  No mass, adenopathy, free fluid, or free air.  No acute osseous findings.  IMPRESSION: Expected postoperative changes of cholecystectomy without  evidence of bile leak or abscess.  Posttraumatic and postoperative changes of the pelvis.   Electronically Signed   By: Lavonia Dana M.D.   On: 11/10/2013 23:52      MDM   Final diagnoses:  None    Patient presents to the ER for evaluation of abdominal pain. She had laparoscopic cholecystectomy 5 days ago. Patient has had persistent upper chondral pain radiating into the back. It has been severe at times, but since arriving in the ER she says it is improved. Her main complaint now is headache. She has a nonfocal neurologic examination. I did recommend performing a CT scan of her head for the unusual headache, but she declined. She is self-pay and is very conscientious about the cost of all of the proceedings. The arrangement I made was to give her some IV Phenergan and IV fluids to see if her headache improved and headache did resolve. She did, however, have a grossly abnormal LFTs and therefore I did convince her and the husband that she needed a CAT scan to further evaluate this. There is no fluid collection to suggest bile leak or abscess. CT scan does not comment on common bile duct size. Retained stone is still a possibility.  Case discussed with Doctor Arnoldo Morale. He will admit the patient to the hospital tonight, repeat LFTs in the morning and make further decisions tomorrow on any further imaging might be necessary.  I personally performed the services described in this documentation, which was scribed in my presence. The recorded information has been reviewed and is accurate.  Traci Greek, MD 11/11/13 [phone]

## 2013-11-11 ENCOUNTER — Observation Stay (HOSPITAL_COMMUNITY): Payer: Self-pay

## 2013-11-11 ENCOUNTER — Encounter (HOSPITAL_COMMUNITY): Payer: Self-pay | Admitting: *Deleted

## 2013-11-11 DIAGNOSIS — R109 Unspecified abdominal pain: Secondary | ICD-10-CM | POA: Diagnosis present

## 2013-11-11 DIAGNOSIS — R1013 Epigastric pain: Secondary | ICD-10-CM | POA: Diagnosis present

## 2013-11-11 LAB — CBC WITH DIFFERENTIAL/PLATELET
Basophils Absolute: 0 10*3/uL (ref 0.0–0.1)
Basophils Relative: 0 % (ref 0–1)
EOS ABS: 0.1 10*3/uL (ref 0.0–0.7)
EOS PCT: 1 % (ref 0–5)
HCT: 34.9 % — ABNORMAL LOW (ref 36.0–46.0)
HEMOGLOBIN: 11.3 g/dL — AB (ref 12.0–15.0)
LYMPHS ABS: 4.1 10*3/uL — AB (ref 0.7–4.0)
Lymphocytes Relative: 45 % (ref 12–46)
MCH: 27.2 pg (ref 26.0–34.0)
MCHC: 32.4 g/dL (ref 30.0–36.0)
MCV: 84.1 fL (ref 78.0–100.0)
MONOS PCT: 7 % (ref 3–12)
Monocytes Absolute: 0.7 10*3/uL (ref 0.1–1.0)
Neutro Abs: 4.4 10*3/uL (ref 1.7–7.7)
Neutrophils Relative %: 47 % (ref 43–77)
Platelets: 390 10*3/uL (ref 150–400)
RBC: 4.15 MIL/uL (ref 3.87–5.11)
RDW: 13.3 % (ref 11.5–15.5)
WBC: 9.3 10*3/uL (ref 4.0–10.5)

## 2013-11-11 LAB — HEPATIC FUNCTION PANEL
ALBUMIN: 3 g/dL — AB (ref 3.5–5.2)
ALT: 780 U/L — AB (ref 0–35)
AST: 417 U/L — ABNORMAL HIGH (ref 0–37)
Alkaline Phosphatase: 191 U/L — ABNORMAL HIGH (ref 39–117)
Bilirubin, Direct: 0.4 mg/dL — ABNORMAL HIGH (ref 0.0–0.3)
Indirect Bilirubin: 0.7 mg/dL (ref 0.3–0.9)
Total Bilirubin: 1.1 mg/dL (ref 0.3–1.2)
Total Protein: 6.8 g/dL (ref 6.0–8.3)

## 2013-11-11 MED ORDER — PANTOPRAZOLE SODIUM 40 MG PO TBEC
40.0000 mg | DELAYED_RELEASE_TABLET | Freq: Every day | ORAL | Status: DC
  Administered 2013-11-11: 40 mg via ORAL
  Filled 2013-11-11: qty 1

## 2013-11-11 MED ORDER — TECHNETIUM TC 99M MEBROFENIN IV KIT
5.0000 | PACK | Freq: Once | INTRAVENOUS | Status: AC | PRN
  Administered 2013-11-11: 5 via INTRAVENOUS

## 2013-11-11 MED ORDER — ONDANSETRON HCL 4 MG/2ML IJ SOLN
4.0000 mg | Freq: Four times a day (QID) | INTRAMUSCULAR | Status: DC | PRN
  Administered 2013-11-11 – 2013-11-12 (×4): 4 mg via INTRAVENOUS
  Filled 2013-11-11 (×4): qty 2

## 2013-11-11 MED ORDER — HYDROMORPHONE HCL PF 1 MG/ML IJ SOLN
1.0000 mg | INTRAMUSCULAR | Status: DC | PRN
  Administered 2013-11-11: 1 mg via INTRAVENOUS
  Filled 2013-11-11: qty 1

## 2013-11-11 MED ORDER — OXYCODONE HCL 5 MG PO TABS
5.0000 mg | ORAL_TABLET | ORAL | Status: DC | PRN
  Administered 2013-11-11: 5 mg via ORAL
  Filled 2013-11-11: qty 1

## 2013-11-11 MED ORDER — ONDANSETRON HCL 4 MG/2ML IJ SOLN: 4.0000 mg | Freq: Three times a day (TID) | INTRAMUSCULAR | Status: DC | PRN

## 2013-11-11 MED ORDER — DIPHENHYDRAMINE HCL 12.5 MG/5ML PO ELIX: 12.5000 mg | ORAL_SOLUTION | Freq: Four times a day (QID) | ORAL | Status: DC | PRN

## 2013-11-11 MED ORDER — DIPHENHYDRAMINE HCL 50 MG/ML IJ SOLN: 12.5000 mg | Freq: Four times a day (QID) | INTRAMUSCULAR | Status: DC | PRN

## 2013-11-11 MED ORDER — HYDROMORPHONE HCL PF 1 MG/ML IJ SOLN
1.0000 mg | INTRAMUSCULAR | Status: DC | PRN
  Administered 2013-11-11 (×2): 1 mg via INTRAVENOUS
  Filled 2013-11-11 (×2): qty 1

## 2013-11-11 MED ORDER — ENOXAPARIN SODIUM 40 MG/0.4ML ~~LOC~~ SOLN
40.0000 mg | SUBCUTANEOUS | Status: DC
Start: 2013-11-11 — End: 2013-11-11
  Administered 2013-11-11: 40 mg via SUBCUTANEOUS
  Filled 2013-11-11: qty 0.4

## 2013-11-11 MED ORDER — HYDROMORPHONE HCL PF 1 MG/ML IJ SOLN
1.0000 mg | INTRAMUSCULAR | Status: DC | PRN
  Administered 2013-11-11 – 2013-11-12 (×5): 1 mg via INTRAVENOUS
  Filled 2013-11-11 (×5): qty 1

## 2013-11-11 MED ORDER — KCL IN DEXTROSE-NACL 20-5-0.45 MEQ/L-%-% IV SOLN
INTRAVENOUS | Status: DC
  Administered 2013-11-11 – 2013-11-12 (×3): via INTRAVENOUS

## 2013-11-11 MED ORDER — LORAZEPAM 2 MG/ML IJ SOLN
1.0000 mg | INTRAMUSCULAR | Status: DC
  Administered 2013-11-11 – 2013-11-12 (×3): 1 mg via INTRAVENOUS
  Filled 2013-11-11 (×3): qty 1

## 2013-11-11 MED ORDER — SODIUM CHLORIDE 0.9 % IV SOLN
INTRAVENOUS | Status: DC
Start: 2013-11-11 — End: 2013-11-11
  Administered 2013-11-11: 01:00:00 via INTRAVENOUS

## 2013-11-11 NOTE — Consult Note (Signed)
Referring Provider: Dr. Aviva Signs Primary Care Physician:  No PCP Per Patient Primary Gastroenterologist:  Dr. Oneida Alar   Date of Admission: 11/10/13 Date of Consultation: 11/11/13  Reason for Consultation:  Elevated LFTs, obstruction of bile duct  HPI:  Traci Schmidt is a 33 year old female status post laparoscopic cholecystectomy on Nov 05, 2013 secondary to cholelithiasis/cholecystitis. Surgery uneventful. Presented yesterday evening with persistent/worsening abdominal pain, back pain, N/V.   Symptoms started Friday evening. Acute onset epigastric pain, radiating up into chest/left-sided chest, mid back discomfort. No fever, chills. States back pain is persistent. N/V resolved. Tolerating clear liquids currently.    Admitting lipase normal, with notable elevated transaminases, alk phos, and Tbili elevated at 2.6. This morning, LFTs continue to improve, with Tbili normalized to 1.1, direct bili 0.4. [place] of abdomen with postoperative fluid in gallbladder fossa, CT without evidence of bile leak or abscess, and HIDA scan without evidence of postoperative leak but did note concern for obstruction of extrahepatic bile ducts. Unable to undergo MRCP due to recent surgery.    Past Medical History  Diagnosis Date  . Gallstones     Past Surgical History  Procedure Laterality Date  . Cesarean section    . Pelvic fracture surgery    . Cholecystectomy N/A 11/05/2013    Dr. Arnoldo Morale    Prior to Admission medications   Medication Sig Start Date End Date Taking? Authorizing Provider  esomeprazole (NEXIUM) 20 MG capsule Take 20 mg by mouth daily at 12 noon.   Yes Historical Provider, MD  ondansetron (ZOFRAN ODT) 4 MG disintegrating tablet Take 1 tablet (4 mg total) by mouth every 8 (eight) hours as needed for nausea. 10/31/13  Yes Tanna Furry, MD  oxyCODONE-acetaminophen (PERCOCET/ROXICET) 5-325 MG per tablet Take 1-2 tablets by mouth every 4 (four) hours as needed. 11/05/13  Yes Jamesetta So, MD   ibuprofen (ADVIL,MOTRIN) 200 MG tablet Take 400 mg by mouth daily as needed for headache or moderate pain.    Historical Provider, MD    Current Facility-Administered Medications  Medication Dose Route Frequency Provider Last Rate Last Dose  . dextrose 5 % and 0.45 % NaCl with KCl 20 mEq/L infusion   Intravenous Continuous Jamesetta So, MD 100 mL/hr at 11/11/13 1806    . diphenhydrAMINE (BENADRYL) injection 12.5-25 mg  12.5-25 mg Intravenous Q6H PRN Jamesetta So, MD       Or  . diphenhydrAMINE (BENADRYL) 12.5 MG/5ML elixir 12.5-25 mg  12.5-25 mg Oral Q6H PRN Jamesetta So, MD      . HYDROmorphone (DILAUDID) injection 1 mg  1 mg Intravenous Q3H PRN Jamesetta So, MD   1 mg at 11/11/13 1806  . ondansetron (ZOFRAN) injection 4 mg  4 mg Intravenous Q6H PRN Jamesetta So, MD   4 mg at 11/11/13 1528  . oxyCODONE (Oxy IR/ROXICODONE) immediate release tablet 5 mg  5 mg Oral Q4H PRN Jamesetta So, MD      . pantoprazole (PROTONIX) EC tablet 40 mg  40 mg Oral Q1200 Jamesetta So, MD   40 mg at 11/11/13 1129    Allergies as of 11/10/2013  . (No Known Allergies)    Family History  Problem Relation Age of Onset  . Colon cancer Neg Hx     History   Social History  . Marital Status: Married    Spouse Name: N/A    Number of Children: N/A  . Years of Education: N/A   Occupational History  . Not  on file.   Social History Main Topics  . Smoking status: Never Smoker   . Smokeless tobacco: Not on file  . Alcohol Use: Yes     Comment: rarely  . Drug Use: No  . Sexual Activity: Yes    Birth Control/ Protection: None   Other Topics Concern  . Not on file   Social History Narrative  . No narrative on file    Review of Systems: Gen: Denies fever, chills, loss of appetite, change in weight or weight loss CV: +chest discomfort  Resp: Denies shortness of breath with rest, cough, wheezing GI: see HPI GU : Denies urinary burning, urinary frequency, urinary incontinence.  MS:  Denies joint pain,swelling, cramping Derm: Denies rash, itching, dry skin Psych: Denies depression, anxiety,confusion, or memory loss Heme: Denies bruising, bleeding, and enlarged lymph nodes.  Physical Exam: Vital signs in last 24 hours: Temp:  [97.6 F (36.4 C)-99 F (37.2 C)] 98.4 F (36.9 C) (05/26 1730) Pulse Rate:  [64-84] 70 (05/26 1730) Resp:  [12-20] 18 (05/26 1730) BP: (100-127)/(61-87) 117/77 mmHg (05/26 1730) SpO2:  [97 %-99 %] 97 % (05/26 1730) Weight:  [160 lb (72.576 kg)] 160 lb (72.576 kg) (05/25 1853) Last BM Date: 11/10/13 General:   Alert,  Well-developed, well-nourished, pleasant and cooperative in NAD Head:  Normocephalic and atraumatic. Eyes:  Sclera clear, no icterus.   Conjunctiva pink. Ears:  Normal auditory acuity. Nose:  No deformity, discharge,  or lesions. Mouth:  No deformity or lesions, dentition normal. Neck:  Supple; no masses or thyromegaly. Lungs:  Clear throughout to auscultation.   No wheezes, crackles, or rhonchi. No acute distress. Heart:  S1 S2 present; no murmurs, clicks, rubs,  or gallops. Abdomen:  Soft, mild TTP epigastric/RUQ/RLQ and nondistended. No masses, hepatosplenomegaly or hernias noted. Post-operative sites intact, staples intact. Rectal:  Deferred  Msk:  Symmetrical without gross deformities. Normal posture. Extremities:  Without clubbing or edema. Neurologic:  Alert and  oriented x4;  grossly normal neurologically. Skin:  Intact without significant lesions or rashes. Cervical Nodes:  No significant cervical adenopathy. Psych:  Alert and cooperative. Normal mood and affect.  Intake/Output from previous day: 05/25 0701 - 05/26 0700 In: 705 [I.V.:705] Out: -  Intake/Output this shift: Total I/O In: 918.3 [I.V.:918.3] Out: -   Lab Results:  Recent Labs  11/10/13 1940 11/11/13 0601  WBC 7.8 9.3  HGB 12.5 11.3*  HCT 38.3 34.9*  PLT 425* 390   BMET  Recent Labs  11/10/13 1940  NA 136*  K 3.8  CL 95*  CO2 27   GLUCOSE 117*  BUN 5*  CREATININE 0.62  CALCIUM 9.8   LFT  Recent Labs  11/10/13 1940 11/11/13 0601  PROT 8.5* 6.8  ALBUMIN 3.8 3.0*  AST 552* 417*  ALT 906* 780*  ALKPHOS 246* 191*  BILITOT 2.6* 1.1  BILIDIR  --  0.4*  IBILI  --  0.7    Studies/Results: Nm Hepatobiliary Liver Func  11/11/2013   ADDENDUM REPORT: 11/11/2013 17:24  ADDENDUM: These results were called by telephone at the time of interpretation on 11/11/2013 at 5:24 PM to Dr. Aviva Signs , who verbally acknowledged these results.   Electronically Signed   By: Skipper Cliche M.D.   On: 11/11/2013 17:24   11/11/2013   CLINICAL DATA:  Status post cholecystectomy, assess for possible common bile duct obstruction  EXAM: NUCLEAR MEDICINE HEPATOBILIARY IMAGING  TECHNIQUE: Sequential images of the abdomen were obtained out to 60 minutes following intravenous  administration of radiopharmaceutical.  RADIOPHARMACEUTICALS:  Five mCi Tc-55mMebrofenin  COMPARISON:  CT scan 11/10/2013 and ultrasound 11/11/2013  FINDINGS: There is prompt hepatocellular uptake. There is no uptake in small bowel over the course of two hours. There is no evidence of biliary collection in the gallbladder fossa to suggest postoperative leak. There is persistent prominent hepatic uptake throughout the course of this to hr examination.  IMPRESSION: Findings are concerning for obstruction of the extrahepatic bile ducts.  Electronically Signed: By: RSkipper ClicheM.D. On: 11/11/2013 16:58   Ct Abdomen Pelvis W Contrast  11/10/2013   CLINICAL DATA:  Five days post cholecystectomy, abdominal pain, nausea, vomiting, constipation, question leak  EXAM: CT ABDOMEN AND PELVIS WITH CONTRAST  TECHNIQUE: Multidetector CT imaging of the abdomen and pelvis was performed using the standard protocol following bolus administration of intravenous contrast. Sagittal and coronal MPR images reconstructed from axial data set.  CONTRAST:  560mOMNIPAQUE IOHEXOL 300 MG/ML SOLN ORALLY,  10074mMNIPAQUE IOHEXOL 300 MG/ML SOLN IV  COMPARISON:  None  FINDINGS: Lung bases clear.  Surgical clips post cholecystectomy.  Heterogeneous collection containing foci of gas at gallbladder fossa compatible with typical postoperative packing with Surgicel.  No perihepatic free fluid is identified to suggest bile leak.  Liver, spleen, pancreas, kidneys, and adrenal glands normal appearance.  Anterior pelvic osseous deformities secondary to prior pelvic fractures and surgery.  Lowdescent of urinary bladder may be related to prior pelvic trauma but cannot exclude cystocele.  Normal appearing uterus and adnexae.  Appendix not visualized.  Scattered stool throughout colon.  Stomach and bowel loops otherwise normal appearance.  No mass, adenopathy, free fluid, or free air.  No acute osseous findings.  IMPRESSION: Expected postoperative changes of cholecystectomy without evidence of bile leak or abscess.  Posttraumatic and postoperative changes of the pelvis.   Electronically Signed   By: MarLavonia DanaD.   On: 11/10/2013 23:52   Us [place] Limited  11/11/2013   CLINICAL DATA:  Right upper quadrant pain, nausea and elevated liver function tests. Cholecystectomy on 11/05/2013.  EXAM: US [place] LIMITED - RIGHT UPPER QUADRANT  COMPARISON:  CT of the abdomen on 11/10/2013  FINDINGS: Gallbladder:  The gallbladder has been removed. There is complex fluid in the gallbladder fossa representing either postoperative fluid, hemorrhage, bile or abscess. This was also present on the CT study.  Common bile duct:  Diameter: Normal caliber of 5 mm.  Liver:  No focal lesion identified. Within normal limits in parenchymal echogenicity.  IMPRESSION: Complex postoperative fluid in the gallbladder fossa.   Electronically Signed   By: GleAletta EdouardD.   On: 11/11/2013 08:27    Impression: [age]ar old female admitted after recent uneventful cholecystectomy and found to have elevated LFTs and concern for biliary obstruction; LFTs  have improved since admission. Unable to proceed with MRCP due to recent surgery. As of note, last Lovenox dose was this morning at 0840. Pain controlled with supportive measures although persistent back discomfort noted; N/V resolved and tolerating clear liquids. Husband present in room during consultation; multiple questions answered. Discussed need for likely ERCP with sphincterotomy, possible stone extraction. Discussed risks and benefits in detail, with both stating understanding. Will consent for ERCP with sphincterotomy, stone extraction with Dr. RouGala Romney 5/27.   Plan: Stop Lovenox (last dose was 5/26 at 0840) Continue Protonix  Tentative ERCP with possible sphincterotomy, stone extraction with Dr. RouGala Romney 5/27 NPO after midnight May have clear liquids HFP in am   AnnLeandra Kern  Kittie Plater [facility]    LOS: 1 day    11/11/2013, 6:12 PM

## 2013-11-11 NOTE — Progress Notes (Signed)
Pt removed hat from toilet and staff has been unable to monitor output. Hat replaced in toilet and pt educated on importance of monitoring. Will continue to monitor.

## 2013-11-11 NOTE — H&P (Signed)
Traci Schmidt is an 33 y.o. female.   Chief Complaint: Abdominal pain, nausea, vomiting HPI: Patient is a 33 year old female status post laparoscopic cholecystectomy for cholecystitis, cholelithiasis on 11/05/2013 who several days after the surgery developed epigastric pain, heartburn, and nausea. She presented emergency room for further evaluation treatment. Liver enzyme tests were noted to be elevated. Lipase was within normal limits. CT scan the abdomen and pelvis was within normal limits, status post laparoscopic cholecystectomy. No biloma was noted. No abscess cavity was noted.  Past Medical History  Diagnosis Date  . Gallstones     Past Surgical History  Procedure Laterality Date  . Cesarean section    . Pelvic fracture surgery    . Cholecystectomy N/A 11/05/2013    Procedure: LAPAROSCOPIC CHOLECYSTECTOMY;  Surgeon: Jamesetta So, MD;  Location: [facility];  Service: General;  Laterality: N/A;    History reviewed. No pertinent family history. Social History:  reports that she has never smoked. She does not have any smokeless tobacco history on file. She reports that she does not drink alcohol or use illicit drugs.  Allergies: No Known Allergies  Medications Prior to Admission  Medication Sig Dispense Refill  . esomeprazole (NEXIUM) 20 MG capsule Take 20 mg by mouth daily at 12 noon.      . ondansetron (ZOFRAN ODT) 4 MG disintegrating tablet Take 1 tablet (4 mg total) by mouth every 8 (eight) hours as needed for nausea.  10 tablet  0  . oxyCODONE-acetaminophen (PERCOCET/ROXICET) 5-325 MG per tablet Take 1-2 tablets by mouth every 4 (four) hours as needed.  50 tablet  0  . ibuprofen (ADVIL,MOTRIN) 200 MG tablet Take 400 mg by mouth daily as needed for headache or moderate pain.        Results for orders placed during the hospital encounter of 11/10/13 (from the past 48 hour(s))  CBC WITH DIFFERENTIAL     Status: Abnormal   Collection Time    11/10/13  7:40 PM      Result Value  Ref Range   WBC 7.8  4.0 - 10.5 K/uL   RBC 4.61  3.87 - 5.11 MIL/uL   Hemoglobin 12.5  12.0 - 15.0 g/dL   HCT 38.3  36.0 - 46.0 %   MCV 83.1  78.0 - 100.0 fL   MCH 27.1  26.0 - 34.0 pg   MCHC 32.6  30.0 - 36.0 g/dL   RDW 13.2  11.5 - 15.5 %   Platelets 425 (*) 150 - 400 K/uL   Neutrophils Relative % 63  43 - 77 %   Neutro Abs 4.8  1.7 - 7.7 K/uL   Lymphocytes Relative 29  12 - 46 %   Lymphs Abs 2.3  0.7 - 4.0 K/uL   Monocytes Relative 7  3 - 12 %   Monocytes Absolute 0.6  0.1 - 1.0 K/uL   Eosinophils Relative 1  0 - 5 %   Eosinophils Absolute 0.1  0.0 - 0.7 K/uL   Basophils Relative 0  0 - 1 %   Basophils Absolute 0.0  0.0 - 0.1 K/uL  COMPREHENSIVE METABOLIC PANEL     Status: Abnormal   Collection Time    11/10/13  7:40 PM      Result Value Ref Range   Sodium 136 (*) 137 - 147 mEq/L   Potassium 3.8  3.7 - 5.3 mEq/L   Chloride 95 (*) 96 - 112 mEq/L   CO2 27  19 - 32 mEq/L   Glucose,  Bld 117 (*) 70 - 99 mg/dL   BUN 5 (*) 6 - 23 mg/dL   Creatinine, Ser 1.06  0.50 - 1.10 mg/dL   Calcium 9.8  8.4 - 81.6 mg/dL   Total Protein 8.5 (*) 6.0 - 8.3 g/dL   Albumin 3.8  3.5 - 5.2 g/dL   AST 619 (*) 0 - 37 U/L   ALT 906 (*) 0 - 35 U/L   Alkaline Phosphatase 246 (*) 39 - 117 U/L   Total Bilirubin 2.6 (*) 0.3 - 1.2 mg/dL   GFR calc non Af Amer >90  >90 mL/min   GFR calc Af Amer >90  >90 mL/min   Comment: (NOTE)     The eGFR has been calculated using the CKD EPI equation.     This calculation has not been validated in all clinical situations.     eGFR's persistently <90 mL/min signify possible Chronic Kidney     Disease.  LIPASE, BLOOD     Status: None   Collection Time    11/10/13  7:40 PM      Result Value Ref Range   Lipase 24  11 - 59 U/L  HEPATIC FUNCTION PANEL     Status: Abnormal   Collection Time    11/11/13  6:01 AM      Result Value Ref Range   Total Protein 6.8  6.0 - 8.3 g/dL   Albumin 3.0 (*) 3.5 - 5.2 g/dL   AST 694 (*) 0 - 37 U/L   ALT 780 (*) 0 - 35 U/L    Alkaline Phosphatase 191 (*) 39 - 117 U/L   Total Bilirubin 1.1  0.3 - 1.2 mg/dL   Bilirubin, Direct 0.4 (*) 0.0 - 0.3 mg/dL   Indirect Bilirubin 0.7  0.3 - 0.9 mg/dL  CBC WITH DIFFERENTIAL     Status: Abnormal   Collection Time    11/11/13  6:01 AM      Result Value Ref Range   WBC 9.3  4.0 - 10.5 K/uL   RBC 4.15  3.87 - 5.11 MIL/uL   Hemoglobin 11.3 (*) 12.0 - 15.0 g/dL   HCT 09.8 (*) 28.6 - 75.1 %   MCV 84.1  78.0 - 100.0 fL   MCH 27.2  26.0 - 34.0 pg   MCHC 32.4  30.0 - 36.0 g/dL   RDW 98.2  42.9 - 98.0 %   Platelets 390  150 - 400 K/uL   Neutrophils Relative % 47  43 - 77 %   Neutro Abs 4.4  1.7 - 7.7 K/uL   Lymphocytes Relative 45  12 - 46 %   Lymphs Abs 4.1 (*) 0.7 - 4.0 K/uL   Monocytes Relative 7  3 - 12 %   Monocytes Absolute 0.7  0.1 - 1.0 K/uL   Eosinophils Relative 1  0 - 5 %   Eosinophils Absolute 0.1  0.0 - 0.7 K/uL   Basophils Relative 0  0 - 1 %   Basophils Absolute 0.0  0.0 - 0.1 K/uL   Ct Abdomen Pelvis W Contrast  11/10/2013   CLINICAL DATA:  Five days post cholecystectomy, abdominal pain, nausea, vomiting, constipation, question leak  EXAM: CT ABDOMEN AND PELVIS WITH CONTRAST  TECHNIQUE: Multidetector CT imaging of the abdomen and pelvis was performed using the standard protocol following bolus administration of intravenous contrast. Sagittal and coronal MPR images reconstructed from axial data set.  CONTRAST:  25mL OMNIPAQUE IOHEXOL 300 MG/ML SOLN ORALLY, OMNIPAQUE IOHEXOL 300 MG/ML  SOLN IV  COMPARISON:  None  FINDINGS: Lung bases clear.  Surgical clips post cholecystectomy.  Heterogeneous collection containing foci of gas at gallbladder fossa compatible with typical postoperative packing with Surgicel.  No perihepatic free fluid is identified to suggest bile leak.  Liver, spleen, pancreas, kidneys, and adrenal glands normal appearance.  Anterior pelvic osseous deformities secondary to prior pelvic fractures and surgery.  Lowdescent of urinary bladder may be  related to prior pelvic trauma but cannot exclude cystocele.  Normal appearing uterus and adnexae.  Appendix not visualized.  Scattered stool throughout colon.  Stomach and bowel loops otherwise normal appearance.  No mass, adenopathy, free fluid, or free air.  No acute osseous findings.  IMPRESSION: Expected postoperative changes of cholecystectomy without evidence of bile leak or abscess.  Posttraumatic and postoperative changes of the pelvis.   Electronically Signed   By: Lavonia Dana M.D.   On: 11/10/2013 23:52    Review of Systems  Constitutional: Positive for malaise/fatigue.  HENT: Negative.   Eyes: Negative.   Respiratory: Negative.   Cardiovascular: Negative.   Gastrointestinal: Positive for heartburn, nausea and abdominal pain.  Genitourinary: Negative.   Musculoskeletal: Negative.   Skin: Negative.     Blood pressure 108/71, pulse 64, temperature 98.2 F (36.8 C), temperature source Oral, resp. rate 16, height $RemoveBe'5\' 3"'ewnXxbxCP$  (1.6 m), weight 72.576 kg (160 lb), last menstrual period 11/05/2013, SpO2 98.00%. Physical Exam  Vitals reviewed. Constitutional: She is oriented to person, place, and time. She appears well-developed and well-nourished.  HENT:  Head: Normocephalic and atraumatic.  Eyes: No scleral icterus.  Neck: Normal range of motion. Neck supple.  Cardiovascular: Normal rate, regular rhythm and normal heart sounds.   Respiratory: Effort normal and breath sounds normal.  GI: Soft. Bowel sounds are normal. She exhibits no distension.  Tender in right quadrant and epigastric region over surgical incisions.  Neurological: She is alert and oriented to person, place, and time.  Skin: Skin is warm and dry.     Assessment/Plan Impression: Elevated liver enzyme tests, status post laparoscopic cholecystectomy. Possible retained common bile duct stone, bile duct leak less likely. Plan: Patient admitted to the hospital for control of her pain and nausea. Ultrasound the gallbladder has  been ordered. Will get gastroenterology involved. All questions answered for patient.  Jamesetta So 11/11/2013, 7:46 AM

## 2013-11-12 ENCOUNTER — Encounter (HOSPITAL_COMMUNITY): Payer: MEDICAID | Admitting: Anesthesiology

## 2013-11-12 ENCOUNTER — Inpatient Hospital Stay (HOSPITAL_COMMUNITY): Payer: Self-pay | Admitting: Anesthesiology

## 2013-11-12 ENCOUNTER — Encounter (HOSPITAL_COMMUNITY)
Admission: EM | Disposition: A | Discharge status: Home / Self Care | Payer: Self-pay | Source: Home / Self Care | Attending: General Surgery

## 2013-11-12 ENCOUNTER — Encounter (HOSPITAL_COMMUNITY): Payer: Self-pay | Admitting: *Deleted

## 2013-11-12 ENCOUNTER — Inpatient Hospital Stay (HOSPITAL_COMMUNITY): Payer: MEDICAID

## 2013-11-12 DIAGNOSIS — K8050 Calculus of bile duct without cholangitis or cholecystitis without obstruction: Secondary | ICD-10-CM

## 2013-11-12 DIAGNOSIS — K5710 Diverticulosis of small intestine without perforation or abscess without bleeding: Secondary | ICD-10-CM

## 2013-11-12 HISTORY — PX: BALLOON DILATION: SHX5330

## 2013-11-12 HISTORY — PX: SPHINCTEROTOMY: SHX5544

## 2013-11-12 HISTORY — PX: ERCP: SHX5425

## 2013-11-12 LAB — HEPATIC FUNCTION PANEL
ALT: 826 U/L — AB (ref 0–35)
AST: 480 U/L — ABNORMAL HIGH (ref 0–37)
Albumin: 3 g/dL — ABNORMAL LOW (ref 3.5–5.2)
Alkaline Phosphatase: 193 U/L — ABNORMAL HIGH (ref 39–117)
BILIRUBIN DIRECT: 1.6 mg/dL — AB (ref 0.0–0.3)
Indirect Bilirubin: 0.7 mg/dL (ref 0.3–0.9)
TOTAL PROTEIN: 6.9 g/dL (ref 6.0–8.3)
Total Bilirubin: 2.3 mg/dL — ABNORMAL HIGH (ref 0.3–1.2)

## 2013-11-12 LAB — BASIC METABOLIC PANEL
CALCIUM: 8.9 mg/dL (ref 8.4–10.5)
CHLORIDE: 102 meq/L (ref 96–112)
CO2: 25 mEq/L (ref 19–32)
Creatinine, Ser: 0.51 mg/dL (ref 0.50–1.10)
GFR calc Af Amer: >90 mL/min (ref >90)
Glucose, Bld: 127 mg/dL — ABNORMAL HIGH (ref 70–99)
Potassium: 3.9 mEq/L (ref 3.7–5.3)
Sodium: 137 mEq/L (ref 137–147)

## 2013-11-12 LAB — PROTIME-INR
INR: 1.31 (ref 0.00–1.49)
PROTHROMBIN TIME: 16 s — AB (ref 11.6–15.2)

## 2013-11-12 SURGERY — ERCP, WITH INTERVENTION IF INDICATED
Anesthesia: General

## 2013-11-12 MED ORDER — MIDAZOLAM HCL 5 MG/5ML IJ SOLN
INTRAMUSCULAR | Status: DC | PRN
  Administered 2013-11-12: 2 mg via INTRAVENOUS

## 2013-11-12 MED ORDER — PROPOFOL 10 MG/ML IV BOLUS
INTRAVENOUS | Status: DC | PRN
  Administered 2013-11-12: 130 mg via INTRAVENOUS

## 2013-11-12 MED ORDER — DEXAMETHASONE SODIUM PHOSPHATE 4 MG/ML IJ SOLN
INTRAMUSCULAR | Status: AC
  Filled 2013-11-12: qty 1

## 2013-11-12 MED ORDER — MIDAZOLAM HCL 2 MG/2ML IJ SOLN
1.0000 mg | INTRAMUSCULAR | Status: DC | PRN
  Administered 2013-11-12: 2 mg via INTRAVENOUS

## 2013-11-12 MED ORDER — INDOMETHACIN 50 MG RE SUPP: 100.0000 mg | RECTAL | Status: DC

## 2013-11-12 MED ORDER — ONDANSETRON HCL 4 MG/2ML IJ SOLN
INTRAMUSCULAR | Status: AC
  Filled 2013-11-12: qty 2

## 2013-11-12 MED ORDER — ONDANSETRON HCL 4 MG/2ML IJ SOLN: 4.0000 mg | Freq: Once | INTRAMUSCULAR | Status: DC | PRN

## 2013-11-12 MED ORDER — PROPOFOL 10 MG/ML IV EMUL
INTRAVENOUS | Status: AC
  Filled 2013-11-12: qty 20

## 2013-11-12 MED ORDER — SODIUM CHLORIDE 0.9 % IV SOLN
INTRAVENOUS | Status: AC
Start: 2013-11-12 — End: 2013-11-12
  Filled 2013-11-12: qty 50

## 2013-11-12 MED ORDER — NEOSTIGMINE METHYLSULFATE 10 MG/10ML IV SOLN
INTRAVENOUS | Status: DC | PRN
  Administered 2013-11-12: 1.5 mg via INTRAVENOUS

## 2013-11-12 MED ORDER — ONDANSETRON HCL 4 MG/2ML IJ SOLN
4.0000 mg | Freq: Once | INTRAMUSCULAR | Status: AC
  Administered 2013-11-12: 4 mg via INTRAVENOUS

## 2013-11-12 MED ORDER — GLYCOPYRROLATE 0.2 MG/ML IJ SOLN
INTRAMUSCULAR | Status: DC | PRN
  Administered 2013-11-12 (×2): 0.2 mg via INTRAVENOUS

## 2013-11-12 MED ORDER — GLYCOPYRROLATE 0.2 MG/ML IJ SOLN
0.2000 mg | Freq: Once | INTRAMUSCULAR | Status: AC
  Administered 2013-11-12: 0.2 mg via INTRAVENOUS

## 2013-11-12 MED ORDER — SCOPOLAMINE 1 MG/3DAYS TD PT72
1.0000 | MEDICATED_PATCH | Freq: Once | TRANSDERMAL | Status: DC
  Administered 2013-11-12: 1.5 mg via TRANSDERMAL

## 2013-11-12 MED ORDER — ACETAMINOPHEN 500 MG PO TABS
500.0000 mg | ORAL_TABLET | ORAL | Status: DC | PRN
  Filled 2013-11-12 (×2): qty 1

## 2013-11-12 MED ORDER — INDOMETHACIN 50 MG RE SUPP
RECTAL | Status: AC
  Filled 2013-11-12: qty 1

## 2013-11-12 MED ORDER — LACTATED RINGERS IV SOLN
INTRAVENOUS | Status: DC
  Administered 2013-11-12: 12:00:00 via INTRAVENOUS

## 2013-11-12 MED ORDER — LIDOCAINE HCL 1 % IJ SOLN
INTRAMUSCULAR | Status: DC | PRN
  Administered 2013-11-12: 50 mg via INTRADERMAL

## 2013-11-12 MED ORDER — GLYCOPYRROLATE 0.2 MG/ML IJ SOLN
INTRAMUSCULAR | Status: AC
  Filled 2013-11-12: qty 2

## 2013-11-12 MED ORDER — SCOPOLAMINE 1 MG/3DAYS TD PT72
MEDICATED_PATCH | TRANSDERMAL | Status: AC
  Filled 2013-11-12: qty 1

## 2013-11-12 MED ORDER — DEXAMETHASONE SODIUM PHOSPHATE 4 MG/ML IJ SOLN
4.0000 mg | Freq: Once | INTRAMUSCULAR | Status: AC
  Administered 2013-11-12: 4 mg via INTRAVENOUS

## 2013-11-12 MED ORDER — ROCURONIUM BROMIDE 100 MG/10ML IV SOLN
INTRAVENOUS | Status: DC | PRN
  Administered 2013-11-12: 25 mg via INTRAVENOUS
  Administered 2013-11-12: 10 mg via INTRAVENOUS

## 2013-11-12 MED ORDER — FENTANYL CITRATE 0.05 MG/ML IJ SOLN
INTRAMUSCULAR | Status: DC | PRN
  Administered 2013-11-12: 25 ug via INTRAVENOUS
  Administered 2013-11-12: 50 ug via INTRAVENOUS
  Administered 2013-11-12: 25 ug via INTRAVENOUS

## 2013-11-12 MED ORDER — GLUCAGON HCL (RDNA) 1 MG IJ SOLR
INTRAMUSCULAR | Status: AC
  Filled 2013-11-12: qty 1

## 2013-11-12 MED ORDER — PANTOPRAZOLE SODIUM 40 MG PO TBEC
40.0000 mg | DELAYED_RELEASE_TABLET | Freq: Every day | ORAL | Status: DC
  Administered 2013-11-13: 40 mg via ORAL
  Filled 2013-11-12 (×2): qty 1

## 2013-11-12 MED ORDER — SODIUM CHLORIDE 0.9 % IV SOLN
INTRAVENOUS | Status: DC
  Administered 2013-11-12: 15:00:00 via INTRAVENOUS

## 2013-11-12 MED ORDER — GLYCOPYRROLATE 0.2 MG/ML IJ SOLN
INTRAMUSCULAR | Status: AC
  Filled 2013-11-12: qty 1

## 2013-11-12 MED ORDER — INDOMETHACIN 50 MG RE SUPP
100.0000 mg | RECTAL | Status: AC
  Administered 2013-11-12: 100 mg via RECTAL
  Filled 2013-11-12: qty 2

## 2013-11-12 MED ORDER — SUCCINYLCHOLINE CHLORIDE 20 MG/ML IJ SOLN
INTRAMUSCULAR | Status: DC | PRN
  Administered 2013-11-12: 140 mg via INTRAVENOUS

## 2013-11-12 MED ORDER — SUCCINYLCHOLINE CHLORIDE 20 MG/ML IJ SOLN
INTRAMUSCULAR | Status: AC
  Filled 2013-11-12: qty 1

## 2013-11-12 MED ORDER — STERILE WATER FOR IRRIGATION IR SOLN
Status: DC | PRN
  Administered 2013-11-12: 13:00:00

## 2013-11-12 MED ORDER — MIDAZOLAM HCL 2 MG/2ML IJ SOLN
INTRAMUSCULAR | Status: AC
  Filled 2013-11-12: qty 2

## 2013-11-12 MED ORDER — IOHEXOL 350 MG/ML SOLN
INTRAVENOUS | Status: DC | PRN
  Administered 2013-11-12: 13:00:00

## 2013-11-12 MED ORDER — FENTANYL CITRATE 0.05 MG/ML IJ SOLN: 25.0000 ug | INTRAMUSCULAR | Status: DC | PRN

## 2013-11-12 SURGICAL SUPPLY — 29 items
BALLN DILATOR CRE WIREGUIDE (BALLOONS) ×2
BALLN RETRIEVAL 12X15 (BALLOONS) ×2 IMPLANT
BALLOON DILATOR CRE WIREGUIDE (BALLOONS) ×1 IMPLANT
BASKET TRAPEZOID 3X6 (MISCELLANEOUS) IMPLANT
BASKET TRAPEZOID LITHO 2.5X5 (MISCELLANEOUS) IMPLANT
BLOCK BITE 60FR ADLT L/F BLUE (MISCELLANEOUS) ×2 IMPLANT
DEVICE INFLATION ENCORE 26 (MISCELLANEOUS) IMPLANT
DEVICE LOCKING W-BIOPSY CAP (MISCELLANEOUS) ×2 IMPLANT
FLOOR PAD 36X40 (MISCELLANEOUS) ×2
GUIDEWIRE HYDRA JAGWIRE .35 (WIRE) ×2 IMPLANT
GUIDEWIRE JAG HINI 025X260CM (WIRE) IMPLANT
KIT CLEAN ENDO COMPLIANCE (KITS) ×2 IMPLANT
NEEDLE HYPO 18GX1.5 BLUNT FILL (NEEDLE) ×2 IMPLANT
PAD FLOOR 36X40 (MISCELLANEOUS) ×1 IMPLANT
SNARE ROTATE MED OVAL 20MM (MISCELLANEOUS) IMPLANT
SNARE SHORT THROW 13M SML OVAL (MISCELLANEOUS) IMPLANT
SNARE SHORT THROW 27M MED OVAL (MISCELLANEOUS) IMPLANT
SPHINCTEROTOME AUTOTOME .25 (MISCELLANEOUS) IMPLANT
SPHINCTEROTOME HYDRATOME 44 (MISCELLANEOUS) ×4 IMPLANT
SPONGE GAUZE 4X4 12PLY (GAUZE/BANDAGES/DRESSINGS) ×2 IMPLANT
SYR 20CC LL (SYRINGE) IMPLANT
SYR 3ML LL SCALE MARK (SYRINGE) IMPLANT
SYR 50ML LL SCALE MARK (SYRINGE) ×4 IMPLANT
SYR INFLATION 60ML (SYRINGE) ×2 IMPLANT
SYSTEM CONTINUOUS INJECTION (MISCELLANEOUS) ×2 IMPLANT
TUBING ENDO SMARTCAP PENTAX (MISCELLANEOUS) ×2 IMPLANT
WALLSTENT METAL COVERED 10X60 (STENTS) IMPLANT
WALLSTENT METAL COVERED 10X80 (STENTS) IMPLANT
WATER STERILE IRR 1000ML POUR (IV SOLUTION) ×4 IMPLANT

## 2013-11-12 NOTE — Consult Note (Signed)
REVIEWED.  

## 2013-11-12 NOTE — Anesthesia Postprocedure Evaluation (Signed)
  Anesthesia Post-op Note  Patient: Traci Schmidt  Procedure(s) Performed: Procedure(s): ENDOSCOPIC RETROGRADE CHOLANGIOPANCREATOGRAPHY (ERCP), SPHINCTEROTOMY, BALLOON DILATION WITH STONE EXTRACTION (N/A)  Patient Location: PACU  Anesthesia Type:General  Level of Consciousness: awake, alert , oriented and patient cooperative  Airway and Oxygen Therapy: Patient Spontanous Breathing  Post-op Pain: none  Post-op Assessment: Post-op Vital signs reviewed, Patient's Cardiovascular Status Stable, Respiratory Function Stable, Patent Airway and Adequate PO intake  Post-op Vital Signs: Reviewed and stable  Last Vitals:  Filed Vitals:   11/12/13 1220  BP: 127/90  Pulse:   Temp:   Resp: 23    Complications: No apparent anesthesia complications

## 2013-11-12 NOTE — Op Note (Signed)
Traci Schmidt, Traci Schmidt            ACCOUNT NO.:  [id number]  MEDICAL RECORD NO.:  [id number]  LOCATION:  [facility]                          FACILITY:  [facility]  PHYSICIAN:  R. Garfield Cornea, MD [place]:  Dec 07, 1980  DATE OF PROCEDURE:  11/12/2013 DATE OF DISCHARGE:                              OPERATIVE REPORT   PROCEDURE:  Endoscopic retrograde cholangiopancreatography with biliary sphincterotomy, sphincterotomy balloon dilation, and balloon stone extraction.  INDICATIONS FOR PROCEDURE:  33 year old lady who is recently status post laparoscopic cholecystectomy for symptomatic cholelithiasis.  Developed recurrent epigastric right upper quadrant abdominal pain and LFT elevation.  HIDA demonstrated no tracer into the duodenum suggestive of extrahepatic biliary obstruction.  LFTs have been elevated in stuttered fashion.  ERCP now being done.  Risks, benefits, limitations, alternatives, and imponderables have been discussed with the patient and family members.  Please see my progress note from earlier today for further discussion.  PROCEDURE NOTE:  General endotracheal anesthesia induced by Dr. Patsey Berthold and associates.  The patient was placed in semiprone position.  The patient received indomethacin 100 mg per rectum prior to the procedure.  INSTRUMENT:  Pentax video chip system.  FINDINGS:  Cursory examination of the distal esophagus, stomach, and duodenum to the second portion revealed the ampulla of Vater readily identified on the medial wall of the second portion of the duodenum. 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  The scope was pulled back to the short position, 55 cm from the incisors.  Scout film was obtained.  Subsequently, using a Microvasive Sphincterotome, the ampulla was approached.  Using  guidewire palpation, I initially, entered the pancreatic duct twice.  I redirected and obtained a biliary  cannulation with the wire fairly easily. Sphincterptome followed up.  Cholangiogram was performed.  There were 4 distal filling defects floating in the bile duct.  I did not see a stricture.  The common bile duct was approximately 6-7 mm in diameter. Initially, I felt that I saw some extravasation contrast below the gallbladder fossa.  This was never confirmed and no further observation of anything concerning for extravasation was made.  The sphincterotome was pulled back across the ampullary orifice at the 12 o'clock position;an 8-9 mm sphincterotomy was performed.  I had a stop due to the close proximity of the duodenal diverticulum.  Subsequently, railed off the sphincterotome and railed on a CRE balloon and carefully inflated there from 9-10 mm x1 minute and took it down.  There was some self-limiting bleeding at the orifice after this maneuver, perhaps 5 mL. Subsequently, I removed the CRE balloon and railed the extractor balloon over the wire to the confluence.  I inflated it to accommodate the size of the duct and I performed balloon occlusion cholangiogram x3 with recovery of 4 stones.  The bile duct appeared to drain very well after this maneuver.  I did not see any evidence of extravasation of contrast or leak with these maneuvers.  Last static film indicated the biliary tree was drained very well after these maneuvers, the patient tolerated the procedure well and was taken to PACU stable condition.  IMPRESSION: 1. Juxta-ampullary duodenal diverticulum. 2. Common bile  duct stones as described above, status post biliary     sphincterotomy, sphincterotomy balloon dilation and stone extraction.  Pancreaticduct not manipulated or injected other than brief guidewire     cannulation as described above. 3. Final interpretation of films pending with the radiologist.  RECOMMENDATIONS: 1. Clear liquid diet today; advance as tolerated in the morning.     Recheck LFTs. 2. Would avoid  Lovenox/heparin for the remainder of her     hospitalization. 3. I communicated my findings with Dr. Arnoldo Morale.  I have discussed my     findings and recommendations with the patient's husband in PACU.     Traci Habermann, MD [facility]     RMR/MEDQ  D:  11/12/2013  T:  11/12/2013  Job:  [id number]

## 2013-11-12 NOTE — Progress Notes (Signed)
Subjective: Still some nausea and epigastric pain.  Objective: Vital signs in last 24 hours: Temp:  [97.6 F (36.4 C)-98.4 F (36.9 C)] 98.1 F (36.7 C) (05/27 0631) Pulse Rate:  [65-79] 73 (05/27 0631) Resp:  [11-20] 11 (05/27 0631) BP: (90-117)/(56-77) 102/61 mmHg (05/27 0631) SpO2:  [97 %-100 %] 100 % (05/27 0631) Last BM Date: 11/10/13  Intake/Output from previous day: 05/26 0701 - 05/27 0700 In: 1718.3 [P.O.:800; I.V.:918.3] Out: 450 [Emesis/NG output:450] Intake/Output this shift:    General appearance: alert, cooperative and fatigued GI: Soft. Incisions healing well.  Lab Results:   Recent Labs  11/10/13 1940 11/11/13 0601  WBC 7.8 9.3  HGB 12.5 11.3*  HCT 38.3 34.9*  PLT 425* 390   BMET  Recent Labs  11/10/13 1940 11/12/13 0518  NA 136* 137  K 3.8 3.9  CL 95* 102  CO2 27 25  GLUCOSE 117* 127*  BUN 5* <3*  CREATININE 0.62 0.51  CALCIUM 9.8 8.9   PT/INR  Recent Labs  11/12/13 0518  LABPROT 16.0*  INR 1.31    Studies/Results: Nm Hepatobiliary Liver Func  11/11/2013   ADDENDUM REPORT: 11/11/2013 17:24  ADDENDUM: These results were called by telephone at the time of interpretation on 11/11/2013 at 5:24 PM to Dr. Aviva Signs , who verbally acknowledged these results.   Electronically Signed   By: Skipper Cliche M.D.   On: 11/11/2013 17:24   11/11/2013   CLINICAL DATA:  Status post cholecystectomy, assess for possible common bile duct obstruction  EXAM: NUCLEAR MEDICINE HEPATOBILIARY IMAGING  TECHNIQUE: Sequential images of the abdomen were obtained out to 60 minutes following intravenous administration of radiopharmaceutical.  RADIOPHARMACEUTICALS:  Five mCi Tc-79m Mebrofenin  COMPARISON:  CT scan 11/10/2013 and ultrasound 11/11/2013  FINDINGS: There is prompt hepatocellular uptake. There is no uptake in small bowel over the course of two hours. There is no evidence of biliary collection in the gallbladder fossa to suggest postoperative leak.  There is persistent prominent hepatic uptake throughout the course of this to hr examination.  IMPRESSION: Findings are concerning for obstruction of the extrahepatic bile ducts.  Electronically Signed: By: Skipper Cliche M.D. On: 11/11/2013 16:58   Ct Abdomen Pelvis W Contrast  11/10/2013   CLINICAL DATA:  Five days post cholecystectomy, abdominal pain, nausea, vomiting, constipation, question leak  EXAM: CT ABDOMEN AND PELVIS WITH CONTRAST  TECHNIQUE: Multidetector CT imaging of the abdomen and pelvis was performed using the standard protocol following bolus administration of intravenous contrast. Sagittal and coronal MPR images reconstructed from axial data set.  CONTRAST:  48mL OMNIPAQUE IOHEXOL 300 MG/ML SOLN ORALLY, 151mL OMNIPAQUE IOHEXOL 300 MG/ML SOLN IV  COMPARISON:  None  FINDINGS: Lung bases clear.  Surgical clips post cholecystectomy.  Heterogeneous collection containing foci of gas at gallbladder fossa compatible with typical postoperative packing with Surgicel.  No perihepatic free fluid is identified to suggest bile leak.  Liver, spleen, pancreas, kidneys, and adrenal glands normal appearance.  Anterior pelvic osseous deformities secondary to prior pelvic fractures and surgery.  Lowdescent of urinary bladder may be related to prior pelvic trauma but cannot exclude cystocele.  Normal appearing uterus and adnexae.  Appendix not visualized.  Scattered stool throughout colon.  Stomach and bowel loops otherwise normal appearance.  No mass, adenopathy, free fluid, or free air.  No acute osseous findings.  IMPRESSION: Expected postoperative changes of cholecystectomy without evidence of bile leak or abscess.  Posttraumatic and postoperative changes of the pelvis.   Electronically Signed  By: Lavonia Dana M.D.   On: 11/10/2013 23:52   US Abdomen Limited  11/11/2013   CLINICAL DATA:  Right upper quadrant pain, nausea and elevated liver function tests. Cholecystectomy on 11/05/2013.  EXAM: US ABDOMEN  LIMITED - RIGHT UPPER QUADRANT  COMPARISON:  CT of the abdomen on 11/10/2013  FINDINGS: Gallbladder:  The gallbladder has been removed. There is complex fluid in the gallbladder fossa representing either postoperative fluid, hemorrhage, bile or abscess. This was also present on the CT study.  Common bile duct:  Diameter: Normal caliber of 5 mm.  Liver:  No focal lesion identified. Within normal limits in parenchymal echogenicity.  IMPRESSION: Complex postoperative fluid in the gallbladder fossa.   Electronically Signed   By: Aletta Edouard M.D.   On: 11/11/2013 08:27    Anti-infectives: Anti-infectives   None      Assessment/Plan: Impression: Abnormal HIDA scan consistent with common bile duct stone. GI has been consulted and will take the patient today to the operating room for an ERCP. All questions from the patient and husband were answered. Further management is pending ERCP results. Appreciate GI input.  LOS: 2 days    Jamesetta So 11/12/2013

## 2013-11-12 NOTE — Transfer of Care (Signed)
Immediate Anesthesia Transfer of Care Note  Patient: Traci Schmidt  Procedure(s) Performed: Procedure(s): ENDOSCOPIC RETROGRADE CHOLANGIOPANCREATOGRAPHY (ERCP), SPHINCTEROTOMY, BALLOON DILATION WITH STONE EXTRACTION (N/A)  Patient Location: PACU  Anesthesia Type:General  Level of Consciousness: awake and patient cooperative  Airway & Oxygen Therapy: Patient Spontanous Breathing and Patient connected to face mask oxygen  Post-op Assessment: Report given to PACU RN, Post -op Vital signs reviewed and stable and Patient moving all extremities  Post vital signs: Reviewed and stable  Complications: No apparent anesthesia complications

## 2013-11-12 NOTE — Anesthesia Procedure Notes (Signed)
Procedure Name: Intubation Date/Time: 11/12/2013 12:32 PM Performed by: Charmaine Downs Pre-anesthesia Checklist: Patient being monitored, Suction available, Emergency Drugs available and Patient identified Patient Re-evaluated:Patient Re-evaluated prior to inductionOxygen Delivery Method: Circle system utilized Preoxygenation: Pre-oxygenation with 100% oxygen Intubation Type: IV induction, Cricoid Pressure applied and Rapid sequence Ventilation: Mask ventilation without difficulty Laryngoscope Size: Mac and 3 Grade View: Grade I Tube type: Oral Tube size: 6.0 mm Number of attempts: 2 Airway Equipment and Method: Stylet Placement Confirmation: ETT inserted through vocal cords under direct vision,  positive ETCO2 and breath sounds checked- equal and bilateral Secured at: 20 cm Tube secured with: Tape Dental Injury: Teeth and Oropharynx as per pre-operative assessment

## 2013-11-12 NOTE — Progress Notes (Signed)
Patient seen and examined. Discussed with Dr. Arnoldo Morale. Imaging today reviewed with the radiologist, Dr. Linton Rump. Clinical suspicion relatively high for a small stone in the bile duct even though biliary tree is not yet dilated. Stuttering nature of aminotransferases and elevated bilirubin (predominantly direct) is suspicious for an occult stone. An element of papillary stenosis not excluded.  Pre-cholecystectomy, LFTs normal. No reason to suggest anesthesia injury or some other cause of hepatitis. HIDA scan also more suggestive of an obstruction rather than intrinsic liver disease.  MRCP cannot be done because of titanium clips recently placed.  I agree with the need for ERCP with sphincterotomy, stone extraction, etc. as appropriate.  The risks, benefits, limitations, alternatives, and mponderable have been reviewed with the patient. I specifically discussed a1 in 10 chance of pancreatitis, reaction to medications, bleeding, perforation and the possibility of a failed ERCP. Potential for sphincterotomy and stent placement also reviewed. Questions have been answered. All parties agreeable. Discuss with patient husband and other family members and preop at length. All parties questions answered. All parties agreeable. Patient will receive indomethacin suppositories prior to the procedure.

## 2013-11-12 NOTE — Progress Notes (Signed)
Patient c/o headache.  Only has Dilaudid ordered. Dr. Arnoldo Morale paged.

## 2013-11-12 NOTE — Anesthesia Preprocedure Evaluation (Signed)
Anesthesia Evaluation  Patient identified by MRN, date of birth, ID band Patient awake    Reviewed: Allergy & Precautions, H&P , NPO status , Patient's Chart, lab work & pertinent test results  Airway Mallampati: II TM Distance: >3 FB Neck ROM: Full    Dental  (+) Teeth Intact   Pulmonary neg pulmonary ROS,  breath sounds clear to auscultation        Cardiovascular negative cardio ROS  Rhythm:Regular Rate:Normal     Neuro/Psych    GI/Hepatic GERD-  Medicated,  Endo/Other    Renal/GU      Musculoskeletal   Abdominal   Peds  Hematology   Anesthesia Other Findings   Reproductive/Obstetrics                           Anesthesia Physical Anesthesia Plan  ASA: II  Anesthesia Plan: General   Post-op Pain Management:    Induction: Intravenous, Rapid sequence and Cricoid pressure planned  Airway Management Planned: Oral ETT  Additional Equipment:   Intra-op Plan:   Post-operative Plan: Extubation in OR  Informed Consent: I have reviewed the patients History and Physical, chart, labs and discussed the procedure including the risks, benefits and alternatives for the proposed anesthesia with the patient or authorized representative who has indicated his/her understanding and acceptance.     Plan Discussed with:   Anesthesia Plan Comments:         Anesthesia Quick Evaluation  

## 2013-11-13 ENCOUNTER — Telehealth: Payer: Self-pay | Admitting: Gastroenterology

## 2013-11-13 ENCOUNTER — Encounter (HOSPITAL_COMMUNITY): Payer: Self-pay | Admitting: Internal Medicine

## 2013-11-13 ENCOUNTER — Other Ambulatory Visit: Payer: Self-pay

## 2013-11-13 DIAGNOSIS — R109 Unspecified abdominal pain: Secondary | ICD-10-CM

## 2013-11-13 DIAGNOSIS — Z9889 Other specified postprocedural states: Secondary | ICD-10-CM

## 2013-11-13 LAB — HEPATIC FUNCTION PANEL
ALBUMIN: 2.9 g/dL — AB (ref 3.5–5.2)
ALT: 816 U/L — AB (ref 0–35)
AST: 314 U/L — ABNORMAL HIGH (ref 0–37)
Alkaline Phosphatase: 198 U/L — ABNORMAL HIGH (ref 39–117)
Bilirubin, Direct: 0.5 mg/dL — ABNORMAL HIGH (ref 0.0–0.3)
Indirect Bilirubin: 0.6 mg/dL (ref 0.3–0.9)
TOTAL PROTEIN: 6.9 g/dL (ref 6.0–8.3)
Total Bilirubin: 1.1 mg/dL (ref 0.3–1.2)

## 2013-11-13 MED ORDER — ONDANSETRON 4 MG PO TBDP: 4.0000 mg | ORAL_TABLET | Freq: Three times a day (TID) | ORAL | Status: DC | PRN

## 2013-11-13 MED ORDER — HYDROCODONE-ACETAMINOPHEN 5-325 MG PO TABS: 1.0000 | ORAL_TABLET | ORAL | Status: AC | PRN

## 2013-11-13 NOTE — Progress Notes (Signed)
Subjective:  Little nausea this am. No vomiting. No abdominal pain.  Objective: Vital signs in last 24 hours: Temp:  [97.6 F (36.4 C)-98.8 F (37.1 C)] 97.6 F (36.4 C) (05/28 0617) Pulse Rate:  [66-88] 68 (05/28 0617) Resp:  [0-69] 18 (05/28 0617) BP: (105-130)/(77-90) 127/78 mmHg (05/28 0617) SpO2:  [94 %-100 %] 99 % (05/28 0617) Weight:  [160 lb (72.576 kg)] 160 lb (72.576 kg) (05/27 1128) Last BM Date: 11/10/13 General:   Alert,  Well-developed, well-nourished, pleasant and cooperative in NAD Head:  Normocephalic and atraumatic. Eyes:  Sclera clear, no icterus.  Abdomen:  Soft, nontender and nondistended.  Normal bowel sounds, without guarding, and without rebound.   Extremities:  Without clubbing, deformity or edema. Neurologic:  Alert and  oriented x4;  grossly normal neurologically. Skin:  Intact without significant lesions or rashes. Psych:  Alert and cooperative. Normal mood and affect.  Intake/Output from previous day: 05/27 0701 - 05/28 0700 In: 1000 [I.V.:1000] Out: -  Intake/Output this shift:    Lab Results: CBC  Recent Labs  11/10/13 1940 11/11/13 0601  WBC 7.8 9.3  HGB 12.5 11.3*  HCT 38.3 34.9*  MCV 83.1 84.1  PLT 425* 390   BMET  Recent Labs  11/10/13 1940 11/12/13 0518  NA 136* 137  K 3.8 3.9  CL 95* 102  CO2 27 25  GLUCOSE 117* 127*  BUN 5* <3*  CREATININE 0.62 0.51  CALCIUM 9.8 8.9   LFTs  Recent Labs  11/11/13 0601 11/12/13 0518 11/13/13 0612  BILITOT 1.1 2.3* 1.1  BILIDIR 0.4* 1.6* 0.5*  IBILI 0.7 0.7 0.6  ALKPHOS 191* 193* 198*  AST 417* 480* 314*  ALT 780* 826* 816*  PROT 6.8 6.9 6.9  ALBUMIN 3.0* 3.0* 2.9*    Recent Labs  11/10/13 1940  LIPASE 24   PT/INR  Recent Labs  11/12/13 0518  LABPROT 16.0*  INR 1.31      Imaging Studies: Nm Hepatobiliary Liver Func  11/11/2013   ADDENDUM REPORT: 11/11/2013 17:24  ADDENDUM: These results were called by telephone at the time of interpretation on  11/11/2013 at 5:24 PM to Dr. Aviva Signs , who verbally acknowledged these results.   Electronically Signed   By: Skipper Cliche M.D.   On: 11/11/2013 17:24   11/11/2013   CLINICAL DATA:  Status post cholecystectomy, assess for possible common bile duct obstruction  EXAM: NUCLEAR MEDICINE HEPATOBILIARY IMAGING  TECHNIQUE: Sequential images of the abdomen were obtained out to 60 minutes following intravenous administration of radiopharmaceutical.  RADIOPHARMACEUTICALS:  Five mCi Tc-12m Mebrofenin  COMPARISON:  CT scan 11/10/2013 and ultrasound 11/11/2013  FINDINGS: There is prompt hepatocellular uptake. There is no uptake in small bowel over the course of two hours. There is no evidence of biliary collection in the gallbladder fossa to suggest postoperative leak. There is persistent prominent hepatic uptake throughout the course of this to hr examination.  IMPRESSION: Findings are concerning for obstruction of the extrahepatic bile ducts.  Electronically Signed: By: Skipper Cliche M.D. On: 11/11/2013 16:58   Ct Abdomen Pelvis W Contrast  11/10/2013   CLINICAL DATA:  Five days post cholecystectomy, abdominal pain, nausea, vomiting, constipation, question leak  EXAM: CT ABDOMEN AND PELVIS WITH CONTRAST  TECHNIQUE: Multidetector CT imaging of the abdomen and pelvis was performed using the standard protocol following bolus administration of intravenous contrast. Sagittal and coronal MPR images reconstructed from axial data set.  CONTRAST:  61mL OMNIPAQUE IOHEXOL 300 MG/ML  SOLN ORALLY, 119mL OMNIPAQUE IOHEXOL 300 MG/ML SOLN IV  COMPARISON:  None  FINDINGS: Lung bases clear.  Surgical clips post cholecystectomy.  Heterogeneous collection containing foci of gas at gallbladder fossa compatible with typical postoperative packing with Surgicel.  No perihepatic free fluid is identified to suggest bile leak.  Liver, spleen, pancreas, kidneys, and adrenal glands normal appearance.  Anterior pelvic osseous deformities  secondary to prior pelvic fractures and surgery.  Lowdescent of urinary bladder may be related to prior pelvic trauma but cannot exclude cystocele.  Normal appearing uterus and adnexae.  Appendix not visualized.  Scattered stool throughout colon.  Stomach and bowel loops otherwise normal appearance.  No mass, adenopathy, free fluid, or free air.  No acute osseous findings.  IMPRESSION: Expected postoperative changes of cholecystectomy without evidence of bile leak or abscess.  Posttraumatic and postoperative changes of the pelvis.   Electronically Signed   By: Lavonia Dana M.D.   On: 11/10/2013 23:52   US Abdomen Limited  11/11/2013   CLINICAL DATA:  Right upper quadrant pain, nausea and elevated liver function tests. Cholecystectomy on 11/05/2013.  EXAM: US ABDOMEN LIMITED - RIGHT UPPER QUADRANT  COMPARISON:  CT of the abdomen on 11/10/2013  FINDINGS: Gallbladder:  The gallbladder has been removed. There is complex fluid in the gallbladder fossa representing either postoperative fluid, hemorrhage, bile or abscess. This was also present on the CT study.  Common bile duct:  Diameter: Normal caliber of 5 mm.  Liver:  No focal lesion identified. Within normal limits in parenchymal echogenicity.  IMPRESSION: Complex postoperative fluid in the gallbladder fossa.   Electronically Signed   By: Aletta Edouard M.D.   On: 11/11/2013 08:27   Dg Ercp With Sphincterotomy  11/12/2013   CLINICAL DATA:  Common bile duct stone.  EXAM: ERCP  FLUOROSCOPY TIME:  2 min 36 seconds  TECHNIQUE: Multiple spot images obtained with the fluoroscopic device and submitted for interpretation post-procedure.  COMPARISON:  CT scan dated 11/10/2013  FINDINGS: Images demonstrate at least 1 and possibly more stones in the common bile duct. Sphincterotomy with balloon sweeps were performed. The last available image demonstrates no residual stones in the common hepatic or common bile duct.  IMPRESSION: Common bile duct stones.  Sphincterotomy and  stone removal.  These images were submitted for radiologic interpretation only. Please see the procedural report for the amount of contrast and the fluoroscopy time utilized.   Electronically Signed   By: Rozetta Nunnery M.D.   On: 11/12/2013 14:41  [2 weeks]   Assessment:  s/p Endoscopic retrograde cholangiopancreatography with biliary sphincterotomy, sphincterotomy balloon dilation, and balloon stone extraction yesterday. No pain this morning. Some nausea without vomiting, ?post-anesthesia. LFTs with modest improvement. Suspect improvement over the next several days. No evidence of post-ERCP pancreatitis or other complications.    Plan: 1. OK with D/C and supportive measures of Nausea.  2. Plan for LFTs first of the week.     LOS: 3 days   Traci Schmidt  11/13/2013, 8:05 AM  Attending note:  Discuss with Dr. Arnoldo Morale ;. Agree with above.

## 2013-11-13 NOTE — Addendum Note (Signed)
Addendum created 11/13/13 0744 by Mickel Baas, CRNA   Modules edited: Notes Section   Notes Section:  File: [id number]

## 2013-11-13 NOTE — Telephone Encounter (Signed)
Needs LFTs done first of the week.

## 2013-11-13 NOTE — Progress Notes (Signed)
AVS reviewed with patient.  Patient verbalized understanding of discharge instructions, physician follow-up and medications.  Patient's IV removed.  Site WNL.  Patient reports all belongings intact and in possession at time of discharge.  Patient c/o of some nausea when getting dressed.  Refused to wait for a prn dose of Zofran and stated that she would get the Zofran filled and take one.  Patient transported by NT via w/c to main entrance for discharge.  Patient stable at time of discharge.

## 2013-11-13 NOTE — Discharge Summary (Signed)
Physician Discharge Summary  Patient ID: Traci Schmidt MRN: [id number] DOB/AGE: 11-03-1980 33 y.o.  Admit date: 11/10/2013 Discharge date: 11/13/2013  Admission Diagnoses: Elevated liver enzyme tests, status post laparoscopic cholecystectomy Discharge Diagnoses: Same, choledocholithiasis Active Problems:   Abdominal pain   Epigastric abdominal pain   Discharged Condition: good  Hospital Course: Patient is a 33 year old female status post laparoscopic cholecystectomy on 11/05/2013 who presented emergency room with worsening nausea and epigastric pain. Her liver tests are noted be significantly elevated, along with an elevated total bilirubin and direct bilirubin. CT scan the abdomen revealed no biloma, subhepatic abscess, or biliary dilatation. She was admitted to the hospital for further evaluation treatment. Ultrasound of the hepatobiliary tree was unremarkable. HIDA scan was performed which revealed no drainage into the small intestine. GI was consulted and the patient subsequently underwent ERCP on 11/12/2013. 4 common bile duct stones were found and removed. She tolerated the procedure well. Postoperative course has been unremarkable. Her diet has been advanced without difficulty. She is being discharged home on 11/13/2013 good and improving condition. Consults: GI   Treatments: surgery: ERCP with stone extraction on 11/12/2013 by Dr. Garfield Cornea  Discharge Exam: Blood pressure 127/78, pulse 68, temperature 97.6 F (36.4 C), temperature source Oral, resp. rate 18, height 5\' 3"  (1.6 m), weight 72.576 kg (160 lb), last menstrual period 11/05/2013, SpO2 99.00%. General appearance: alert, cooperative and no distress Resp: clear to auscultation bilaterally Cardio: regular rate and rhythm, S1, S2 normal, no murmur, click, rub or gallop GI: Soft. Incisions healing well. Staples removed and Steri-Strips applied.  Disposition: 01-Home or Self Care     Medication List    STOP taking  these medications       oxyCODONE-acetaminophen 5-325 MG per tablet  Commonly known as:  PERCOCET/ROXICET      TAKE these medications       esomeprazole 20 MG capsule  Commonly known as:  NEXIUM  Take 20 mg by mouth daily at 12 noon.     HYDROcodone-acetaminophen 5-325 MG per tablet  Commonly known as:  NORCO/VICODIN  Take 1-2 tablets by mouth every 4 (four) hours as needed for moderate pain.     ibuprofen 200 MG tablet  Commonly known as:  ADVIL,MOTRIN  Take 400 mg by mouth daily as needed for headache or moderate pain.     ondansetron 4 MG disintegrating tablet  Commonly known as:  ZOFRAN ODT  Take 1 tablet (4 mg total) by mouth every 8 (eight) hours as needed for nausea.           Follow-up Information   Follow up with Jamesetta So, MD. (As needed)    Specialty:  General Surgery   Contact information:   [address] [place] [place] [address] [phone]       Signed: Jamesetta So 11/13/2013, 8:42 AM

## 2013-11-13 NOTE — Discharge Instructions (Signed)
Endoscopic Retrograde Cholangiopancreatography (ERCP), Care After Refer to this sheet in the next few weeks. These instructions provide you with information on caring for yourself after your procedure. Your health care provider may also give you more specific instructions. Your treatment has been planned according to current medical practices, but problems sometimes occur. Call your health care provider if you have any problems or questions after your procedure.  WHAT TO EXPECT AFTER THE PROCEDURE  After your procedure, it is typical to feel:   Soreness in your throat.   Sick to your stomach (nauseous).   Bloated.  Dizzy.   Fatigued. HOME CARE INSTRUCTIONS  Have a friend or family member stay with you for the first 24 hours after your procedure.  Start taking your usual medicines and eating normally as soon as you feel well enough to do so or as directed by your health care provider. SEEK MEDICAL CARE IF:  You have abdominal pain.   You develop signs of infection, such as:   Chills.   Feeling unwell.  SEEK IMMEDIATE MEDICAL CARE IF:  You have difficulty swallowing.  You have worsening throat, chest, or abdominal pain.  You vomit.  You have bloody or very black stools.  You have a fever. Document Released: 03/26/2013 Document Reviewed: 12/09/2012 [facility] Patient Information 2014 [place].

## 2013-11-13 NOTE — Telephone Encounter (Signed)
Lab order done and mailed to the pt.  

## 2013-11-13 NOTE — Anesthesia Postprocedure Evaluation (Signed)
  Anesthesia Post-op Note  Patient: Traci Schmidt  Procedure(s) Performed: Procedure(s): ENDOSCOPIC RETROGRADE CHOLANGIOPANCREATOGRAPHY (ERCP), SPHINCTEROTOMY, BALLOON DILATION WITH STONE EXTRACTION (N/A) SPHINCTEROTOMY (N/A) BALLOON DILATION WITH STONE EXTRACTION (N/A)  Patient Location: Room 340  Anesthesia Type:General  Level of Consciousness: awake, alert , oriented and patient cooperative  Airway and Oxygen Therapy: Patient Spontanous Breathing  Post-op Pain: mild  Post-op Assessment: Post-op Vital signs reviewed, Patient's Cardiovascular Status Stable, Respiratory Function Stable, Patent Airway and No signs of Nausea or vomiting  Post-op Vital Signs: Reviewed and stable  Last Vitals:  Filed Vitals:   11/13/13 0617  BP: 127/78  Pulse: 68  Temp: 36.4 C  Resp: 18    Complications: No apparent anesthesia complications

## 2014-10-18 ENCOUNTER — Emergency Department (HOSPITAL_COMMUNITY)
Admission: EM | Admit: 2014-10-18 | Discharge: 2014-10-18 | Disposition: A | Discharge status: Home / Self Care | Payer: 59 | Attending: Emergency Medicine | Admitting: Emergency Medicine

## 2014-10-18 ENCOUNTER — Encounter (HOSPITAL_COMMUNITY): Payer: Self-pay | Admitting: Emergency Medicine

## 2014-10-18 ENCOUNTER — Emergency Department (HOSPITAL_COMMUNITY): Payer: 59

## 2014-10-18 DIAGNOSIS — R05 Cough: Secondary | ICD-10-CM | POA: Diagnosis present

## 2014-10-18 DIAGNOSIS — M791 Myalgia: Secondary | ICD-10-CM | POA: Diagnosis not present

## 2014-10-18 DIAGNOSIS — R112 Nausea with vomiting, unspecified: Secondary | ICD-10-CM | POA: Insufficient documentation

## 2014-10-18 DIAGNOSIS — Z8719 Personal history of other diseases of the digestive system: Secondary | ICD-10-CM | POA: Diagnosis not present

## 2014-10-18 DIAGNOSIS — Z3202 Encounter for pregnancy test, result negative: Secondary | ICD-10-CM | POA: Diagnosis not present

## 2014-10-18 DIAGNOSIS — R51 Headache: Secondary | ICD-10-CM | POA: Diagnosis not present

## 2014-10-18 DIAGNOSIS — J020 Streptococcal pharyngitis: Secondary | ICD-10-CM | POA: Insufficient documentation

## 2014-10-18 LAB — CBC WITH DIFFERENTIAL/PLATELET
Basophils Absolute: 0 K/uL (ref 0.0–0.1)
Basophils Relative: 0 % (ref 0–1)
Eosinophils Absolute: 0 K/uL (ref 0.0–0.7)
Eosinophils Relative: 0 % (ref 0–5)
HCT: 39.9 % (ref 36.0–46.0)
Hemoglobin: 12.8 g/dL (ref 12.0–15.0)
Lymphocytes Relative: 27 % (ref 12–46)
Lymphs Abs: 2.6 K/uL (ref 0.7–4.0)
MCH: 27.6 pg (ref 26.0–34.0)
MCHC: 32.1 g/dL (ref 30.0–36.0)
MCV: 86.2 fL (ref 78.0–100.0)
Monocytes Absolute: 0.7 K/uL (ref 0.1–1.0)
Monocytes Relative: 8 % (ref 3–12)
Neutro Abs: 6.1 K/uL (ref 1.7–7.7)
Neutrophils Relative %: 65 % (ref 43–77)
Platelets: 256 K/uL (ref 150–400)
RBC: 4.63 MIL/uL (ref 3.87–5.11)
RDW: 13.5 % (ref 11.5–15.5)
WBC: 9.4 K/uL (ref 4.0–10.5)

## 2014-10-18 LAB — URINE MICROSCOPIC-ADD ON

## 2014-10-18 LAB — URINALYSIS, ROUTINE W REFLEX MICROSCOPIC
Bilirubin Urine: NEGATIVE
Glucose, UA: NEGATIVE mg/dL
Ketones, ur: 15 mg/dL — AB
Leukocytes, UA: NEGATIVE
Nitrite: NEGATIVE
Protein, ur: NEGATIVE mg/dL
Specific Gravity, Urine: 1.01 (ref 1.005–1.030)
Urobilinogen, UA: 0.2 mg/dL (ref 0.0–1.0)
pH: 6.5 (ref 5.0–8.0)

## 2014-10-18 LAB — COMPREHENSIVE METABOLIC PANEL WITH GFR
ALT: 25 U/L (ref 14–54)
AST: 24 U/L (ref 15–41)
Albumin: 3.7 g/dL (ref 3.5–5.0)
Alkaline Phosphatase: 58 U/L (ref 38–126)
Anion gap: 8 (ref 5–15)
BUN: 5 mg/dL — ABNORMAL LOW (ref 6–20)
CO2: 28 mmol/L (ref 22–32)
Calcium: 8.6 mg/dL — ABNORMAL LOW (ref 8.9–10.3)
Chloride: 101 mmol/L (ref 101–111)
Creatinine, Ser: 0.65 mg/dL (ref 0.44–1.00)
GFR calc Af Amer: >60 mL/min (ref >60)
GFR calc non Af Amer: >60 mL/min (ref >60)
Glucose, Bld: 108 mg/dL — ABNORMAL HIGH (ref 70–99)
Potassium: 3.1 mmol/L — ABNORMAL LOW (ref 3.5–5.1)
Sodium: 137 mmol/L (ref 135–145)
Total Bilirubin: 0.5 mg/dL (ref 0.3–1.2)
Total Protein: 7.7 g/dL (ref 6.5–8.1)

## 2014-10-18 LAB — RAPID STREP SCREEN (MED CTR MEBANE ONLY): STREPTOCOCCUS, GROUP A SCREEN (DIRECT): POSITIVE — AB

## 2014-10-18 LAB — LIPASE, BLOOD: Lipase: 21 U/L — ABNORMAL LOW (ref 22–51)

## 2014-10-18 LAB — PREGNANCY, URINE: PREG TEST UR: NEGATIVE

## 2014-10-18 MED ORDER — KETOROLAC TROMETHAMINE 30 MG/ML IJ SOLN
30.0000 mg | Freq: Once | INTRAMUSCULAR | Status: AC
  Administered 2014-10-18: 30 mg via INTRAVENOUS
  Filled 2014-10-18: qty 1

## 2014-10-18 MED ORDER — CEPHALEXIN 500 MG PO CAPS: 500.0000 mg | ORAL_CAPSULE | Freq: Three times a day (TID) | ORAL | Status: DC

## 2014-10-18 MED ORDER — SODIUM CHLORIDE 0.9 % IV BOLUS (SEPSIS)
1000.0000 mL | Freq: Once | INTRAVENOUS | Status: AC
  Administered 2014-10-18: 1000 mL via INTRAVENOUS

## 2014-10-18 NOTE — ED Notes (Signed)
Patient verbalizes understanding of discharge instructions, prescription medications, home care and follow up care. Patient ambulatory out of department at this time with family. 

## 2014-10-18 NOTE — ED Provider Notes (Signed)
CSN: [id number]     Arrival date & time 10/18/14  1946 History   First MD Initiated Contact with Patient 10/18/14 2105     Chief Complaint  Patient presents with  . Influenza     (Consider location/radiation/quality/duration/timing/severity/associated sxs/prior Treatment) HPI Comments: Patient is a 34 year old female with no significant past medical history. She presents for evaluation of sore throat, body aches, headache, chills, fever, nausea, vomiting, cough and feeling tired. She has had decreased by mouth intake over the past 2 days. She denies any diarrhea. Her 2 daughters and husband have been ill, however her symptoms are much more pronounced than what her family has experienced.  Patient is a 34 y.o. female presenting with flu symptoms. The history is provided by the patient.  Influenza Presenting symptoms: cough, fatigue, fever, headache, myalgias, nausea and sore throat   Presenting symptoms: no diarrhea   Severity:  Moderate Onset quality:  Sudden Duration:  2 days Progression:  Worsening Chronicity:  New Relieved by:  Nothing Worsened by:  Nothing tried Ineffective treatments:  None tried Associated symptoms: chills     Past Medical History  Diagnosis Date  . Gallstones    Past Surgical History  Procedure Laterality Date  . Cesarean section    . Pelvic fracture surgery    . Cholecystectomy N/A 11/05/2013    Dr. Arnoldo Morale  . Ercp N/A 11/12/2013    Procedure: ENDOSCOPIC RETROGRADE CHOLANGIOPANCREATOGRAPHY (ERCP), SPHINCTEROTOMY, BALLOON DILATION WITH STONE EXTRACTION;  Surgeon: Daneil Dolin, MD;  Location: [facility];  Service: Endoscopy;  Laterality: N/A;  . Sphincterotomy N/A 11/12/2013    Procedure: SPHINCTEROTOMY;  Surgeon: Daneil Dolin, MD;  Location: [facility];  Service: Endoscopy;  Laterality: N/A;  . Balloon dilation N/A 11/12/2013    Procedure: BALLOON DILATION WITH STONE EXTRACTION;  Surgeon: Daneil Dolin, MD;  Location: [facility];  Service: Endoscopy;  Laterality:  N/A;   Family History  Problem Relation Age of Onset  . Colon cancer Neg Hx    History  Substance Use Topics  . Smoking status: Never Smoker   . Smokeless tobacco: Not on file  . Alcohol Use: Yes     Comment: rarely   OB History    No data available     Review of Systems  Constitutional: Positive for fever, chills and fatigue.  HENT: Positive for sore throat.   Respiratory: Positive for cough.   Gastrointestinal: Positive for nausea. Negative for diarrhea.  Musculoskeletal: Positive for myalgias.  Neurological: Positive for headaches.  All other systems reviewed and are negative.     Allergies  Oxycodone  Home Medications   Prior to Admission medications   Medication Sig Start Date End Date Taking? Authorizing Provider  amoxicillin-clavulanate (AUGMENTIN) 875-125 MG per tablet Take 1 tablet by mouth daily as needed.   Yes Historical Provider, MD  ibuprofen (ADVIL,MOTRIN) 200 MG tablet Take 400 mg by mouth daily as needed for headache or moderate pain.   Yes Historical Provider, MD  Pheniramine-PE-APAP 20-10-325 MG PACK Take 1 packet by mouth daily as needed (for fever and flu symptoms).   Yes Historical Provider, MD  Phenyleph-Doxylamine-DM-APAP 5-6.25-10-325 MG/15ML LIQD Take 15 mLs by mouth at bedtime as needed (for cold and flu symptoms).   Yes Historical Provider, MD  HYDROcodone-acetaminophen (NORCO/VICODIN) 5-325 MG per tablet Take 1-2 tablets by mouth every 4 (four) hours as needed for moderate pain. Patient not taking: Reported on 10/18/2014 11/13/13 11/13/14  Aviva Signs Md, MD  ondansetron (ZOFRAN ODT) 4 MG  disintegrating tablet Take 1 tablet (4 mg total) by mouth every 8 (eight) hours as needed for nausea. Patient not taking: Reported on 10/18/2014 11/13/13   Aviva Signs Md, MD   BP 119/83 mmHg  Pulse 103  Temp(Src) 99.9 F (37.7 C) (Oral)  Resp 20  Ht 5\' 2"  (1.575 m)  Wt 170 lb (77.111 kg)  BMI 31.09 kg/m2  SpO2 95%  LMP 10/16/2014 Physical Exam   Constitutional: She is oriented to person, place, and time. She appears well-developed and well-nourished. No distress.  HENT:  Head: Normocephalic and atraumatic.  Mouth/Throat: Oropharynx is clear and moist.  Neck: Normal range of motion. Neck supple.  Cardiovascular: Normal rate and regular rhythm.  Exam reveals no gallop and no friction rub.   No murmur heard. Pulmonary/Chest: Effort normal and breath sounds normal. No respiratory distress. She has no wheezes. She has no rales.  Abdominal: Soft. Bowel sounds are normal. She exhibits no distension. There is no tenderness.  Musculoskeletal: Normal range of motion. She exhibits no edema.  Lymphadenopathy:    She has no cervical adenopathy.  Neurological: She is alert and oriented to person, place, and time.  Skin: Skin is warm and dry. She is not diaphoretic.  Nursing note and vitals reviewed.   ED Course  Procedures (including critical care time) Labs Review Labs Reviewed  RAPID STREP SCREEN  CBC WITH DIFFERENTIAL/PLATELET  COMPREHENSIVE METABOLIC PANEL  LIPASE, BLOOD  URINALYSIS, ROUTINE W REFLEX MICROSCOPIC  PREGNANCY, URINE    Imaging Review No results found.   EKG Interpretation None      MDM   Final diagnoses:  None    Patient is a 34 year old female who presents with a 2 day history of sore throat, body aches, fatigue, and vomiting. Her workup reveals unremarkable laboratory studies and urinalysis, however her strep test is positive. I will treat with Keflex, anti-inflammatories, and when necessary return.    Veryl Speak, MD 10/18/14 2242

## 2014-10-18 NOTE — ED Notes (Signed)
Patient ambulatory to restroom  ?

## 2014-10-18 NOTE — ED Notes (Signed)
Patient reports sore throat, body aches, headache, chills, fever, nausea, emesis, cough, fatigue. Husband reports, "We came here to get her hydrated."

## 2014-10-18 NOTE — Discharge Instructions (Signed)
Keflex as prescribed.  Ibuprofen 600 mg rotated with Tylenol 1000 mg every 3 hours as needed for pain or fever.  Return to the emergency department for an inability to swallow, difficulty breathing, or other new and concerning symptoms.   Strep Throat Strep throat is an infection of the throat caused by a bacteria named Streptococcus pyogenes. Your health care provider may call the infection streptococcal "tonsillitis" or "pharyngitis" depending on whether there are signs of inflammation in the tonsils or back of the throat. Strep throat is most common in children aged 5-15 years during the cold months of the year, but it can occur in people of any age during any season. This infection is spread from person to person (contagious) through coughing, sneezing, or other close contact. SIGNS AND SYMPTOMS   Fever or chills.  Painful, swollen, red tonsils or throat.  Pain or difficulty when swallowing.  White or yellow spots on the tonsils or throat.  Swollen, tender lymph nodes or "glands" of the neck or under the jaw.  Red rash all over the body (rare). DIAGNOSIS  Many different infections can cause the same symptoms. A test must be done to confirm the diagnosis so the right treatment can be given. A "rapid strep test" can help your health care provider make the diagnosis in a few minutes. If this test is not available, a light swab of the infected area can be used for a throat culture test. If a throat culture test is done, results are usually available in a day or two. TREATMENT  Strep throat is treated with antibiotic medicine. HOME CARE INSTRUCTIONS   Gargle with 1 tsp of salt in 1 cup of warm water, 3-4 times per day or as needed for comfort.  Family members who also have a sore throat or fever should be tested for strep throat and treated with antibiotics if they have the strep infection.  Make sure everyone in your household washes their hands well.  Do not share food, drinking  cups, or personal items that could cause the infection to spread to others.  You may need to eat a soft food diet until your sore throat gets better.  Drink enough water and fluids to keep your urine clear or pale yellow. This will help prevent dehydration.  Get plenty of rest.  Stay home from school, day care, or work until you have been on antibiotics for 24 hours.  Take medicines only as directed by your health care provider.  Take your antibiotic medicine as directed by your health care provider. Finish it even if you start to feel better. SEEK MEDICAL CARE IF:   The glands in your neck continue to enlarge.  You develop a rash, cough, or earache.  You cough up green, yellow-brown, or bloody sputum.  You have pain or discomfort not controlled by medicines.  Your problems seem to be getting worse rather than better.  You have a fever. SEEK IMMEDIATE MEDICAL CARE IF:   You develop any new symptoms such as vomiting, severe headache, stiff or painful neck, chest pain, shortness of breath, or trouble swallowing.  You develop severe throat pain, drooling, or changes in your voice.  You develop swelling of the neck, or the skin on the neck becomes red and tender.  You develop signs of dehydration, such as fatigue, dry mouth, and decreased urination.  You become increasingly sleepy, or you cannot wake up completely. MAKE SURE YOU:  Understand these instructions.  Will watch your condition.  Will get help right away if you are not doing well or get worse. Document Released: 06/02/2000 Document Revised: 10/20/2013 Document Reviewed: 08/04/2010 [facility] Patient Information 2015 [place], [place]. This information is not intended to replace advice given to you by your health care provider. Make sure you discuss any questions you have with your health care provider.

## 2016-06-19 HISTORY — PX: OTHER SURGICAL HISTORY: SHX169

## 2017-06-22 ENCOUNTER — Encounter: Payer: Self-pay | Admitting: Family Medicine

## 2017-06-22 ENCOUNTER — Other Ambulatory Visit: Payer: Self-pay

## 2017-06-22 ENCOUNTER — Ambulatory Visit (INDEPENDENT_AMBULATORY_CARE_PROVIDER_SITE_OTHER): Payer: BLUE CROSS/BLUE SHIELD | Admitting: Family Medicine

## 2017-06-22 VITALS — BP 132/70 | HR 78 | Temp 98.1°F | Resp 14 | Ht 61.0 in | Wt 174.0 lb

## 2017-06-22 DIAGNOSIS — Z1231 Encounter for screening mammogram for malignant neoplasm of breast: Secondary | ICD-10-CM

## 2017-06-22 DIAGNOSIS — M5441 Lumbago with sciatica, right side: Secondary | ICD-10-CM | POA: Diagnosis not present

## 2017-06-22 DIAGNOSIS — Z1239 Encounter for other screening for malignant neoplasm of breast: Secondary | ICD-10-CM

## 2017-06-22 DIAGNOSIS — Z0000 Encounter for general adult medical examination without abnormal findings: Secondary | ICD-10-CM | POA: Diagnosis not present

## 2017-06-22 DIAGNOSIS — E669 Obesity, unspecified: Secondary | ICD-10-CM

## 2017-06-22 DIAGNOSIS — E7439 Other disorders of intestinal carbohydrate absorption: Secondary | ICD-10-CM | POA: Diagnosis not present

## 2017-06-22 DIAGNOSIS — Z803 Family history of malignant neoplasm of breast: Secondary | ICD-10-CM

## 2017-06-22 DIAGNOSIS — G8929 Other chronic pain: Secondary | ICD-10-CM | POA: Diagnosis not present

## 2017-06-22 MED ORDER — PREDNISONE 10 MG PO TABS: ORAL_TABLET | ORAL | 0 refills | Status: DC
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
## 2017-06-22 NOTE — Patient Instructions (Addendum)
Release of records- [facility] - Dr. Adah Perl F/U Monday for LAB VISIT- FASTIN GLABS Take steroids  Mammogram breast center [facility]  F/U pending results

## 2017-06-23 DIAGNOSIS — E669 Obesity, unspecified: Secondary | ICD-10-CM | POA: Insufficient documentation

## 2017-06-25 ENCOUNTER — Other Ambulatory Visit: Payer: Self-pay

## 2017-06-25 ENCOUNTER — Ambulatory Visit (HOSPITAL_COMMUNITY)
Admission: RE | Admit: 2017-06-25 | Discharge: 2017-06-25 | Disposition: A | Discharge status: Home / Self Care | Payer: BLUE CROSS/BLUE SHIELD | Source: Ambulatory Visit | Attending: Family Medicine | Admitting: Family Medicine

## 2017-06-25 DIAGNOSIS — X58XXXA Exposure to other specified factors, initial encounter: Secondary | ICD-10-CM | POA: Diagnosis not present

## 2017-06-25 DIAGNOSIS — M5441 Lumbago with sciatica, right side: Secondary | ICD-10-CM | POA: Insufficient documentation

## 2017-06-25 DIAGNOSIS — S329XXA Fracture of unspecified parts of lumbosacral spine and pelvis, initial encounter for closed fracture: Secondary | ICD-10-CM | POA: Diagnosis not present

## 2017-06-25 DIAGNOSIS — G8929 Other chronic pain: Secondary | ICD-10-CM

## 2017-06-26 ENCOUNTER — Other Ambulatory Visit: Payer: Self-pay

## 2017-06-26 ENCOUNTER — Other Ambulatory Visit: Payer: Self-pay | Admitting: Family Medicine

## 2017-06-26 DIAGNOSIS — Z0000 Encounter for general adult medical examination without abnormal findings: Secondary | ICD-10-CM | POA: Diagnosis not present

## 2017-06-26 DIAGNOSIS — R29898 Other symptoms and signs involving the musculoskeletal system: Secondary | ICD-10-CM

## 2017-06-26 DIAGNOSIS — E7439 Other disorders of intestinal carbohydrate absorption: Secondary | ICD-10-CM

## 2017-06-26 DIAGNOSIS — M5410 Radiculopathy, site unspecified: Secondary | ICD-10-CM

## 2017-06-26 NOTE — Progress Notes (Signed)
MRI to be done for back pain with radiation> 6 months Treated conservatively

## 2017-06-26 NOTE — Addendum Note (Signed)
Addended by: Vic Blackbird F on: 06/26/2017 04:46 PM   Modules accepted: Orders

## 2017-06-27 LAB — HEMOGLOBIN A1C
HEMOGLOBIN A1C: 5.4 %{Hb} (ref <5.7)
Mean Plasma Glucose: 108 (calc)
eAG (mmol/L): 6 (calc)

## 2017-06-27 LAB — CBC WITH DIFFERENTIAL/PLATELET
Basophils Absolute: 33 cells/uL (ref 0–200)
Basophils Relative: 0.2 %
Eosinophils Absolute: 50 cells/uL (ref 15–500)
Eosinophils Relative: 0.3 %
HEMATOCRIT: 40.3 % (ref 35.0–45.0)
HEMOGLOBIN: 13.1 g/dL (ref 11.7–15.5)
LYMPHS ABS: 7271 {cells}/uL — AB (ref 850–3900)
MCH: 27.2 pg (ref 27.0–33.0)
MCHC: 32.5 g/dL (ref 32.0–36.0)
MCV: 83.8 fL (ref 80.0–100.0)
MPV: 9.6 fL (ref 7.5–12.5)
Monocytes Relative: 4.9 %
NEUTROS PCT: 50.8 %
Neutro Abs: 8433 cells/uL — ABNORMAL HIGH (ref 1500–7800)
Platelets: 401 10*3/uL — ABNORMAL HIGH (ref 140–400)
RBC: 4.81 10*6/uL (ref 3.80–5.10)
RDW: 12.2 % (ref 11.0–15.0)
Total Lymphocyte: 43.8 %
WBC: 16.6 10*3/uL — ABNORMAL HIGH (ref 3.8–10.8)
WBCMIX: 813 {cells}/uL (ref 200–950)

## 2017-06-27 LAB — LIPID PANEL
CHOLESTEROL: 162 mg/dL (ref <200)
HDL: 77 mg/dL (ref >50)
LDL Cholesterol (Calc): 69 mg/dL (calc)
Non-HDL Cholesterol (Calc): 85 mg/dL (calc) (ref <130)
Total CHOL/HDL Ratio: 2.1 (calc) (ref <5.0)
Triglycerides: 80 mg/dL (ref <150)

## 2017-06-27 LAB — COMPREHENSIVE METABOLIC PANEL
AG RATIO: 1.3 (calc) (ref 1.0–2.5)
ALBUMIN MSPROF: 4.1 g/dL (ref 3.6–5.1)
ALKALINE PHOSPHATASE (APISO): 52 U/L (ref 33–115)
ALT: 13 U/L (ref 6–29)
AST: 10 U/L (ref 10–30)
BILIRUBIN TOTAL: 0.5 mg/dL (ref 0.2–1.2)
BUN: 11 mg/dL (ref 7–25)
CALCIUM: 9.4 mg/dL (ref 8.6–10.2)
CHLORIDE: 104 mmol/L (ref 98–110)
CO2: 26 mmol/L (ref 20–32)
Creat: 0.67 mg/dL (ref 0.50–1.10)
GLOBULIN: 3.2 g/dL (ref 1.9–3.7)
Glucose, Bld: 78 mg/dL (ref 65–99)
POTASSIUM: 4.2 mmol/L (ref 3.5–5.3)
Sodium: 140 mmol/L (ref 135–146)
Total Protein: 7.3 g/dL (ref 6.1–8.1)

## 2017-06-27 LAB — TSH: TSH: 1.46 mIU/L

## 2017-07-02 ENCOUNTER — Other Ambulatory Visit: Payer: Self-pay | Admitting: Family Medicine

## 2017-07-09 ENCOUNTER — Ambulatory Visit (INDEPENDENT_AMBULATORY_CARE_PROVIDER_SITE_OTHER): Payer: BLUE CROSS/BLUE SHIELD | Admitting: Orthopedic Surgery

## 2017-07-09 ENCOUNTER — Encounter (INDEPENDENT_AMBULATORY_CARE_PROVIDER_SITE_OTHER): Payer: Self-pay | Admitting: Orthopedic Surgery

## 2017-07-09 DIAGNOSIS — M5441 Lumbago with sciatica, right side: Secondary | ICD-10-CM | POA: Diagnosis not present

## 2017-07-09 DIAGNOSIS — M25551 Pain in right hip: Secondary | ICD-10-CM | POA: Diagnosis not present

## 2017-07-09 MED ORDER — TRAMADOL HCL 50 MG PO TABS: ORAL_TABLET | ORAL | 0 refills | Status: DC

## 2017-07-09 MED ORDER — PREDNISONE 5 MG (21) PO TBPK: ORAL_TABLET | ORAL | 0 refills | Status: DC
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
## 2017-07-14 ENCOUNTER — Other Ambulatory Visit (INDEPENDENT_AMBULATORY_CARE_PROVIDER_SITE_OTHER): Payer: Self-pay | Admitting: Orthopedic Surgery

## 2017-07-16 NOTE — Telephone Encounter (Signed)
Ok to rf? 

## 2017-07-16 NOTE — Telephone Encounter (Signed)
At this point I would just do 5 mg/day instead of the Dosepak until she gets her scans #30 with no refill thanks

## 2017-07-16 NOTE — Telephone Encounter (Signed)
Yes, she is asking for refill. IC s/w her and she states she is hurting really bad. Pain is becoming unbearable. She has thought about going to ER b/c of pain. She said ultram is not helping her pain either. Scans still pending.  Please advise. Thanks.

## 2017-07-16 NOTE — Telephone Encounter (Signed)
She had one dosepack before I saw her - I wrote for one and now she wants another is this correct

## 2017-07-23 ENCOUNTER — Ambulatory Visit
Admission: RE | Admit: 2017-07-23 | Discharge: 2017-07-23 | Disposition: A | Discharge status: Home / Self Care | Payer: BLUE CROSS/BLUE SHIELD | Source: Ambulatory Visit | Attending: Orthopedic Surgery | Admitting: Orthopedic Surgery

## 2017-07-23 ENCOUNTER — Other Ambulatory Visit (INDEPENDENT_AMBULATORY_CARE_PROVIDER_SITE_OTHER): Payer: Self-pay | Admitting: Orthopedic Surgery

## 2017-07-23 ENCOUNTER — Other Ambulatory Visit: Payer: Self-pay

## 2017-07-23 DIAGNOSIS — M25551 Pain in right hip: Secondary | ICD-10-CM

## 2017-07-23 DIAGNOSIS — M47817 Spondylosis without myelopathy or radiculopathy, lumbosacral region: Secondary | ICD-10-CM | POA: Diagnosis not present

## 2017-07-23 DIAGNOSIS — M5441 Lumbago with sciatica, right side: Secondary | ICD-10-CM

## 2017-07-25 ENCOUNTER — Encounter (INDEPENDENT_AMBULATORY_CARE_PROVIDER_SITE_OTHER): Payer: Self-pay | Admitting: Orthopedic Surgery

## 2017-07-25 ENCOUNTER — Ambulatory Visit (INDEPENDENT_AMBULATORY_CARE_PROVIDER_SITE_OTHER): Payer: BLUE CROSS/BLUE SHIELD | Admitting: Orthopedic Surgery

## 2017-07-25 DIAGNOSIS — M5441 Lumbago with sciatica, right side: Secondary | ICD-10-CM | POA: Diagnosis not present

## 2017-07-25 MED ORDER — IBUPROFEN-FAMOTIDINE 800-26.6 MG PO TABS: 1.0000 | ORAL_TABLET | Freq: Every day | ORAL | 0 refills | Status: DC

## 2017-07-25 MED ORDER — OXYCODONE HCL 5 MG PO CAPS: 5.0000 mg | ORAL_CAPSULE | Freq: Every evening | ORAL | 0 refills | Status: DC | PRN

## 2017-07-25 MED ORDER — TRAMADOL HCL 50 MG PO TABS: ORAL_TABLET | ORAL | 0 refills | Status: DC

## 2017-07-26 ENCOUNTER — Telehealth (INDEPENDENT_AMBULATORY_CARE_PROVIDER_SITE_OTHER): Payer: Self-pay | Admitting: Orthopedic Surgery

## 2017-07-26 ENCOUNTER — Other Ambulatory Visit (INDEPENDENT_AMBULATORY_CARE_PROVIDER_SITE_OTHER): Payer: Self-pay

## 2017-07-26 LAB — EXTRA LAV TOP TUBE

## 2017-07-26 LAB — VITAMIN D 25 HYDROXY (VIT D DEFICIENCY, FRACTURES): VIT D 25 HYDROXY: 13 ng/mL — AB (ref 30–100)

## 2017-07-26 MED ORDER — IBUPROFEN-FAMOTIDINE 800-26.6 MG PO TABS: 1.0000 | ORAL_TABLET | Freq: Every day | ORAL | 0 refills | Status: DC

## 2017-07-26 MED ORDER — VITAMIN D (ERGOCALCIFEROL) 1.25 MG (50000 UNIT) PO CAPS: ORAL_CAPSULE | ORAL | 0 refills | Status: DC

## 2017-07-26 NOTE — Progress Notes (Signed)
Please call patient.  Vitamin D is 13.  She needs 50,000 units a week orally 1 time a week for 12 weeks.  Thanks

## 2017-07-26 NOTE — Telephone Encounter (Signed)
IC verified with patient she actually uses [facility] in [place].

## 2017-07-26 NOTE — Telephone Encounter (Signed)
Patient called stating that she received a call from [facility] that she was not eligible for the program so her Ibuprofen-Famotidine needs to go [facility] [place].  Thank you.

## 2017-07-28 NOTE — Progress Notes (Signed)
Office Visit Note   Patient: Traci Schmidt           Date of Birth: February 04, 1981           MRN: [id number] Visit Date: 07/25/2017 Requested by: Alycia Rossetti, MD [address] [place], [place] [address] PCP: Alycia Rossetti, MD  Subjective: Chief Complaint  Patient presents with  . Lower Back - Pain, Follow-up    HPI: Patient presents for follow-up of MRI scan of her lumbar spine and CT scan of the pelvis.  The patient is taking tramadol and prednisone in the morning and oxycodone at night.  She has significant pain and radicular symptoms radiating down the right leg.  From previous notes she had a pelvic fracture in [place] 13 years ago.  This was treated with what sounds like anterior plating and subsequent plate removal.  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  CT scan shows nonunited sacral fracture and some widening of the SI joint on the right-hand side.              ROS: All systems reviewed are negative as they relate to the chief complaint within the history of present illness.  Patient denies  fevers or chills.   Assessment & Plan: Visit Diagnoses:  1. 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  This is making the patient very symptomatic.  Plan is referral to Dr. Marcelino Scot for evaluation for sacral fusion referral to Dr. Saintclair Halsted for decompression.  Patient states her recent thyroid function was normal.  I checked that and it was true.  I also checked a vitamin D level which is low at 13.  That is being supplemented.  We will put her on Duexis as well.  We need to try to have her come off the prednisone.  Refill oxycodone and tramadol.  I will see her back as  needed  Follow-Up Instructions: Return if symptoms worsen or fail to improve.   Orders:  Orders Placed This Encounter  Procedures  . Vitamin D (25 hydroxy)  . EXTRA LAV TOP TUBE  . Ambulatory referral to Orthopedic Surgery  . Ambulatory referral to Neurosurgery   Meds ordered this encounter  Medications  . traMADol (ULTRAM) 50 MG tablet    Sig: 1 po q 12 hrs prn pain    Dispense:  60 tablet    Refill:  0  . oxycodone (OXY-IR) 5 MG capsule    Sig: Take 1 capsule (5 mg total) by mouth at bedtime as needed.    Dispense:  35 capsule    Refill:  0  . DISCONTD: Ibuprofen-Famotidine 800-26.6 MG TABS    Sig: Take 1 capsule by mouth daily.    Dispense:  45 tablet    Refill:  0      Procedures: No procedures performed   Clinical Data: No additional findings.  Objective: Vital Signs: There were no vitals taken for this visit.  Physical Exam:   Constitutional: Patient appears well-developed HEENT:  Head: Normocephalic Eyes:EOM are normal Neck: Normal range of motion Cardiovascular: Normal rate Pulmonary/chest: Effort normal Neurologic: Patient is alert Skin: Skin is warm Psychiatric: Patient has normal mood and affect    Ortho Exam: Orthopedic exam  demonstrates some pain with ambulation.  Good motor sensory function in both legs but she does have some paresthesias on the right.  Ankle dorsiflexion plantarflexion strength is intact.  No real groin pain with internal/external rotation of the leg.  Specialty Comments:  No specialty comments available.  Imaging: No results found.   PMFS History: Patient Active Problem List   Diagnosis Date Noted  . Obesity (BMI 30-39.9) 06/23/2017   Past Medical History:  Diagnosis Date  . Allergy    seasonal  . Gallstones     Family History  Problem Relation Age of Onset  . Cancer Mother        breast  . Diabetes Mother   . Hypertension Mother   . Hyperlipidemia Father   . Diabetes Father   . Diabetes Brother   .  Hyperlipidemia Brother   . Hyperlipidemia Brother   . Colon cancer Neg Hx     Past Surgical History:  Procedure Laterality Date  . BALLOON DILATION N/A 11/12/2013   Procedure: BALLOON DILATION WITH STONE EXTRACTION;  Surgeon: Daneil Dolin, MD;  Location: [facility];  Service: Endoscopy;  Laterality: N/A;  . CESAREAN SECTION    . CHOLECYSTECTOMY N/A 11/05/2013   Dr. Arnoldo Morale  . ERCP N/A 11/12/2013   Procedure: ENDOSCOPIC RETROGRADE CHOLANGIOPANCREATOGRAPHY (ERCP), SPHINCTEROTOMY, BALLOON DILATION WITH STONE EXTRACTION;  Surgeon: Daneil Dolin, MD;  Location: [facility];  Service: Endoscopy;  Laterality: N/A;  . PELVIC FRACTURE SURGERY    . SPHINCTEROTOMY N/A 11/12/2013   Procedure: SPHINCTEROTOMY;  Surgeon: Daneil Dolin, MD;  Location: [facility];  Service: Endoscopy;  Laterality: N/A;   Social History   Occupational History  . Not on file  Tobacco Use  . Smoking status: Never Smoker  . Smokeless tobacco: Never Used  Substance and Sexual Activity  . Alcohol use: No    Frequency: Never    Comment: rarely  . Drug use: No  . Sexual activity: Yes    Birth control/protection: None

## 2017-07-31 ENCOUNTER — Ambulatory Visit
Admission: RE | Admit: 2017-07-31 | Discharge: 2017-07-31 | Disposition: A | Discharge status: Home / Self Care | Payer: BLUE CROSS/BLUE SHIELD | Source: Ambulatory Visit | Attending: Family Medicine | Admitting: Family Medicine

## 2017-07-31 DIAGNOSIS — Z803 Family history of malignant neoplasm of breast: Secondary | ICD-10-CM

## 2017-07-31 DIAGNOSIS — Z1239 Encounter for other screening for malignant neoplasm of breast: Secondary | ICD-10-CM

## 2017-07-31 DIAGNOSIS — Z1231 Encounter for screening mammogram for malignant neoplasm of breast: Secondary | ICD-10-CM | POA: Diagnosis not present

## 2017-08-01 ENCOUNTER — Other Ambulatory Visit (INDEPENDENT_AMBULATORY_CARE_PROVIDER_SITE_OTHER): Payer: Self-pay | Admitting: Radiology

## 2017-08-01 MED ORDER — IBUPROFEN 800 MG PO TABS: 800.0000 mg | ORAL_TABLET | Freq: Two times a day (BID) | ORAL | 0 refills | Status: DC

## 2017-08-01 NOTE — Telephone Encounter (Signed)
I spoke to patient, she has BCBS ins, but part of it is paid through [employer], so there is government funding and that is why Duexis cannot be filled for her.  Per Dr Marlou Sa I have sent ibuprofen 800 mg to her pharmacy and advised her to get famotidine OTC if needed.  She asked as well about the referral to Dr Saintclair Halsted, I have advised her it has been faxed now.

## 2017-08-03 ENCOUNTER — Telehealth: Payer: Self-pay | Admitting: Family Medicine

## 2017-08-03 DIAGNOSIS — M5416 Radiculopathy, lumbar region: Secondary | ICD-10-CM | POA: Diagnosis not present

## 2017-08-03 DIAGNOSIS — Z6832 Body mass index (BMI) 32.0-32.9, adult: Secondary | ICD-10-CM | POA: Diagnosis not present

## 2017-08-03 DIAGNOSIS — R030 Elevated blood-pressure reading, without diagnosis of hypertension: Secondary | ICD-10-CM | POA: Diagnosis not present

## 2017-08-03 MED ORDER — OSELTAMIVIR PHOSPHATE 75 MG PO CAPS: 75.0000 mg | ORAL_CAPSULE | Freq: Every day | ORAL | 0 refills | Status: DC

## 2017-08-03 NOTE — Telephone Encounter (Signed)
Pt needs tamiflu called in to [facility]  [address] Pts daughter was dx with flu yesterday.

## 2017-08-22 DIAGNOSIS — M5416 Radiculopathy, lumbar region: Secondary | ICD-10-CM | POA: Diagnosis not present

## 2017-08-22 DIAGNOSIS — Z6832 Body mass index (BMI) 32.0-32.9, adult: Secondary | ICD-10-CM | POA: Diagnosis not present

## 2017-08-27 ENCOUNTER — Telehealth (INDEPENDENT_AMBULATORY_CARE_PROVIDER_SITE_OTHER): Payer: Self-pay | Admitting: Orthopedic Surgery

## 2017-08-27 DIAGNOSIS — M5441 Lumbago with sciatica, right side: Secondary | ICD-10-CM

## 2017-08-27 NOTE — Telephone Encounter (Signed)
Ok to refill 

## 2017-08-27 NOTE — Telephone Encounter (Signed)
Patient called requesting a refill on her oxycodone. CB # [phone]

## 2017-08-28 MED ORDER — OXYCODONE HCL 5 MG PO CAPS: 5.0000 mg | ORAL_CAPSULE | Freq: Every evening | ORAL | 0 refills | Status: DC | PRN

## 2017-08-28 NOTE — Telephone Encounter (Signed)
Patient saw neurosurgery about 2-3 weeks ago, they referred to have injection but she states this made her pain much worse. She has an appt to see Dr Marcelino Scot tomorrow.

## 2017-08-28 NOTE — Telephone Encounter (Signed)
Ok to rf has she seen [facility] or [facility]?

## 2017-08-28 NOTE — Addendum Note (Signed)
Addended byLaurann Montana on: 08/28/2017 09:55 AM   Modules accepted: Orders

## 2017-08-28 NOTE — Telephone Encounter (Signed)
ok 

## 2017-08-30 ENCOUNTER — Telehealth (INDEPENDENT_AMBULATORY_CARE_PROVIDER_SITE_OTHER): Payer: Self-pay

## 2017-08-30 NOTE — Telephone Encounter (Signed)
Larene Beach with [facility] in [place], [place] would like a call from Dr. Marlou Sa concerning Rx for Oxycodone.  Stated that Rx needs to be changed to tablets instead of capsules.  Cb# [phone].  Please advise.  Thank You.

## 2017-08-30 NOTE — Telephone Encounter (Signed)
Dr Marlou Sa called gave verbal order to change.

## 2017-10-14 ENCOUNTER — Other Ambulatory Visit (INDEPENDENT_AMBULATORY_CARE_PROVIDER_SITE_OTHER): Payer: Self-pay | Admitting: Orthopedic Surgery

## 2017-10-15 NOTE — Telephone Encounter (Signed)
IC s/w patient. She stated she is going to her PCP to have labs drawn b/c it is closer to her home and then will have results faxed to [place]. She also wanted to let Dr Marlou Sa know that she has an appt 05/08 to see spine specialist at [facility], I have made patient aware.

## 2017-10-15 NOTE — Telephone Encounter (Signed)
Ok thx.

## 2017-10-15 NOTE — Telephone Encounter (Signed)
Needs recheck on vit d level before rf can come in for nurse only visit

## 2017-10-15 NOTE — Telephone Encounter (Signed)
Do you want to rf?

## 2017-10-24 DIAGNOSIS — M549 Dorsalgia, unspecified: Secondary | ICD-10-CM | POA: Diagnosis not present

## 2017-10-24 DIAGNOSIS — S329XXA Fracture of unspecified parts of lumbosacral spine and pelvis, initial encounter for closed fracture: Secondary | ICD-10-CM | POA: Diagnosis not present

## 2017-10-24 DIAGNOSIS — M79604 Pain in right leg: Secondary | ICD-10-CM | POA: Diagnosis not present

## 2017-10-24 DIAGNOSIS — Z6831 Body mass index (BMI) 31.0-31.9, adult: Secondary | ICD-10-CM | POA: Diagnosis not present

## 2017-11-14 ENCOUNTER — Other Ambulatory Visit: Payer: BLUE CROSS/BLUE SHIELD

## 2017-11-16 ENCOUNTER — Other Ambulatory Visit (INDEPENDENT_AMBULATORY_CARE_PROVIDER_SITE_OTHER): Payer: Self-pay | Admitting: Orthopedic Surgery

## 2017-11-16 NOTE — Telephone Encounter (Signed)
Ok to rf? 

## 2017-11-17 NOTE — Telephone Encounter (Signed)
y

## 2017-11-19 DIAGNOSIS — S93401A Sprain of unspecified ligament of right ankle, initial encounter: Secondary | ICD-10-CM | POA: Diagnosis not present

## 2017-11-19 DIAGNOSIS — M25571 Pain in right ankle and joints of right foot: Secondary | ICD-10-CM | POA: Diagnosis not present

## 2017-11-21 DIAGNOSIS — M7601 Gluteal tendinitis, right hip: Secondary | ICD-10-CM | POA: Diagnosis not present

## 2017-11-21 DIAGNOSIS — M549 Dorsalgia, unspecified: Secondary | ICD-10-CM | POA: Diagnosis not present

## 2017-11-21 DIAGNOSIS — S329XXS Fracture of unspecified parts of lumbosacral spine and pelvis, sequela: Secondary | ICD-10-CM | POA: Diagnosis not present

## 2017-11-21 DIAGNOSIS — M7061 Trochanteric bursitis, right hip: Secondary | ICD-10-CM | POA: Diagnosis not present

## 2017-11-28 DIAGNOSIS — M62551 Muscle wasting and atrophy, not elsewhere classified, right thigh: Secondary | ICD-10-CM | POA: Diagnosis not present

## 2017-11-28 DIAGNOSIS — M79651 Pain in right thigh: Secondary | ICD-10-CM | POA: Diagnosis not present

## 2017-11-28 DIAGNOSIS — M25651 Stiffness of right hip, not elsewhere classified: Secondary | ICD-10-CM | POA: Diagnosis not present

## 2017-11-28 DIAGNOSIS — M25551 Pain in right hip: Secondary | ICD-10-CM | POA: Diagnosis not present

## 2017-11-29 DIAGNOSIS — M62551 Muscle wasting and atrophy, not elsewhere classified, right thigh: Secondary | ICD-10-CM | POA: Diagnosis not present

## 2017-11-29 DIAGNOSIS — M79651 Pain in right thigh: Secondary | ICD-10-CM | POA: Diagnosis not present

## 2017-11-29 DIAGNOSIS — M25551 Pain in right hip: Secondary | ICD-10-CM | POA: Diagnosis not present

## 2017-11-29 DIAGNOSIS — M25651 Stiffness of right hip, not elsewhere classified: Secondary | ICD-10-CM | POA: Diagnosis not present

## 2017-12-03 DIAGNOSIS — M25551 Pain in right hip: Secondary | ICD-10-CM | POA: Diagnosis not present

## 2017-12-03 DIAGNOSIS — M62551 Muscle wasting and atrophy, not elsewhere classified, right thigh: Secondary | ICD-10-CM | POA: Diagnosis not present

## 2017-12-03 DIAGNOSIS — M79651 Pain in right thigh: Secondary | ICD-10-CM | POA: Diagnosis not present

## 2017-12-03 DIAGNOSIS — M25651 Stiffness of right hip, not elsewhere classified: Secondary | ICD-10-CM | POA: Diagnosis not present

## 2017-12-05 DIAGNOSIS — M62551 Muscle wasting and atrophy, not elsewhere classified, right thigh: Secondary | ICD-10-CM | POA: Diagnosis not present

## 2017-12-05 DIAGNOSIS — M25551 Pain in right hip: Secondary | ICD-10-CM | POA: Diagnosis not present

## 2017-12-05 DIAGNOSIS — M25651 Stiffness of right hip, not elsewhere classified: Secondary | ICD-10-CM | POA: Diagnosis not present

## 2017-12-05 DIAGNOSIS — M79651 Pain in right thigh: Secondary | ICD-10-CM | POA: Diagnosis not present

## 2017-12-05 DIAGNOSIS — M549 Dorsalgia, unspecified: Secondary | ICD-10-CM | POA: Diagnosis not present

## 2017-12-07 DIAGNOSIS — M25551 Pain in right hip: Secondary | ICD-10-CM | POA: Diagnosis not present

## 2017-12-07 DIAGNOSIS — M25651 Stiffness of right hip, not elsewhere classified: Secondary | ICD-10-CM | POA: Diagnosis not present

## 2017-12-07 DIAGNOSIS — M79651 Pain in right thigh: Secondary | ICD-10-CM | POA: Diagnosis not present

## 2017-12-07 DIAGNOSIS — M62551 Muscle wasting and atrophy, not elsewhere classified, right thigh: Secondary | ICD-10-CM | POA: Diagnosis not present

## 2017-12-10 DIAGNOSIS — M62551 Muscle wasting and atrophy, not elsewhere classified, right thigh: Secondary | ICD-10-CM | POA: Diagnosis not present

## 2017-12-10 DIAGNOSIS — M79651 Pain in right thigh: Secondary | ICD-10-CM | POA: Diagnosis not present

## 2017-12-10 DIAGNOSIS — M25551 Pain in right hip: Secondary | ICD-10-CM | POA: Diagnosis not present

## 2017-12-10 DIAGNOSIS — M25651 Stiffness of right hip, not elsewhere classified: Secondary | ICD-10-CM | POA: Diagnosis not present

## 2017-12-11 ENCOUNTER — Ambulatory Visit: Payer: BLUE CROSS/BLUE SHIELD | Admitting: Family Medicine

## 2017-12-13 DIAGNOSIS — M79651 Pain in right thigh: Secondary | ICD-10-CM | POA: Diagnosis not present

## 2017-12-13 DIAGNOSIS — M62551 Muscle wasting and atrophy, not elsewhere classified, right thigh: Secondary | ICD-10-CM | POA: Diagnosis not present

## 2017-12-13 DIAGNOSIS — M25551 Pain in right hip: Secondary | ICD-10-CM | POA: Diagnosis not present

## 2017-12-13 DIAGNOSIS — M25651 Stiffness of right hip, not elsewhere classified: Secondary | ICD-10-CM | POA: Diagnosis not present

## 2017-12-14 ENCOUNTER — Other Ambulatory Visit: Payer: Self-pay

## 2017-12-14 ENCOUNTER — Ambulatory Visit: Payer: BLUE CROSS/BLUE SHIELD | Admitting: Family Medicine

## 2017-12-14 ENCOUNTER — Encounter: Payer: Self-pay | Admitting: Family Medicine

## 2017-12-14 VITALS — BP 128/68 | HR 80 | Temp 98.4°F | Resp 14 | Ht 61.0 in | Wt 175.0 lb

## 2017-12-14 DIAGNOSIS — M533 Sacrococcygeal disorders, not elsewhere classified: Secondary | ICD-10-CM | POA: Diagnosis not present

## 2017-12-14 DIAGNOSIS — T148XXA Other injury of unspecified body region, initial encounter: Secondary | ICD-10-CM | POA: Diagnosis not present

## 2017-12-14 DIAGNOSIS — M5441 Lumbago with sciatica, right side: Secondary | ICD-10-CM | POA: Diagnosis not present

## 2017-12-14 DIAGNOSIS — Z8781 Personal history of (healed) traumatic fracture: Secondary | ICD-10-CM | POA: Diagnosis not present

## 2017-12-14 DIAGNOSIS — M79651 Pain in right thigh: Secondary | ICD-10-CM | POA: Diagnosis not present

## 2017-12-14 DIAGNOSIS — M25651 Stiffness of right hip, not elsewhere classified: Secondary | ICD-10-CM | POA: Diagnosis not present

## 2017-12-14 DIAGNOSIS — G8929 Other chronic pain: Secondary | ICD-10-CM

## 2017-12-14 DIAGNOSIS — M25551 Pain in right hip: Secondary | ICD-10-CM | POA: Diagnosis not present

## 2017-12-14 DIAGNOSIS — M62551 Muscle wasting and atrophy, not elsewhere classified, right thigh: Secondary | ICD-10-CM | POA: Diagnosis not present

## 2017-12-14 NOTE — Patient Instructions (Addendum)
New Referral to [facility] or [facility]  We will check your platelets  Decrease Celebrex to once a day  Continue the cymbalta F/U Jan Physical

## 2017-12-14 NOTE — Progress Notes (Signed)
Subjective:    Patient ID: Traci Schmidt, female    DOB: [birth year], 37 y.o.   MRN: [id number]  Patient presents for Follow-up (leg pain) Here with continued pelvic back and leg pain.  Is been seen by multiple specialists over the past few months.  I initially referred her to orthopedics Dr. Marlou Sa he then referred her to a neurosurgeon for epidural injections and sciatica region but this did not help very much.  Her pain is down her right leg.  She was then referred to Dr. Ginette Pitman who then sent her to Dr. Gerome Apley.  She had MRIs done but with her pelvic trauma is been difficult to find any surgical solution to her pain.  She was last refer to Dr. Lavone Neri cough for SI joint injections he was also noted in that last note that she may benefit from SI joint fusion, also told she has IT band problem/bursitis .  She has been taking Lyrica Celebrex Cymbalta she has had physical therapy she has had narcotic medication.  She does get some improvement from physical therapy as well as the neuropathic medications but still has chronic pain.  She is fearful that she will live with severe pain her entire life.  She is unable to care for her kids the way she would like to play with them move around exercise due to her severe pain.  She like to be referred to another specialist for another opinion about any surgical intervention that would help.  She has also noted a few bruises coming up randomly on arms, no other abnormal bleeding No specific injury   Review Of Systems:  GEN- denies fatigue, fever, weight loss,weakness, recent illness HEENT- denies eye drainage, change in vision, nasal discharge, CVS- denies chest pain, palpitations RESP- denies SOB, cough, wheeze ABD- denies N/V, change in stools, abd pain GU- denies dysuria, hematuria, dribbling, incontinence MSK- denies joint pain, muscle aches, injury Neuro- denies headache, dizziness, syncope, seizure activity       Objective:    BP 128/68   Pulse  80   Temp 98.4 F (36.9 C) (Oral)   Resp 14   Ht 5\' 1"  (1.549 m)   Wt 175 lb (79.4 kg)   LMP 12/09/2017 Comment: regular  SpO2 97%   BMI 33.07 kg/m  GEN- NAD, alert and oriented x3 HEENT- PERRL, EOMI, non injected sclera, pink conjunctiva, MMM, oropharynx clear Neck- Supple, no thyromegaly CVS- RRR, no murmur RESP-CTAB ABD-NABS,soft,NT,ND MSK- TTP lumbar spine, Right hip, decreased ROM, good ROM bilat kne Skin- faded smnall bruise forearm, no hematoma EXT- No edema Pulses- Radial, DP- 2+        Assessment & Plan:      Problem List Items Addressed This Visit    None    Visit Diagnoses    Bruising    -  Primary   labs normal, decrease celebex to once a day, minimal help with pain and likley cause of easy bruising   Relevant Orders   CBC with Differential/Platelet (Completed)   Protime-INR (Completed)   Chronic right SI joint pain       Referral to 2nd opinion for her pain, back/hip issues, her previous pelvic fracture and boney formations make her a difficult case. continue lyrica and cymbalta which help with her frustrated mood in setting of her pain   Relevant Medications   celecoxib (CELEBREX) 200 MG capsule   DULoxetine (CYMBALTA) 30 MG capsule   pregabalin (LYRICA) 75 MG capsule   Chronic bilateral low  back pain with right-sided sciatica       Relevant Medications   celecoxib (CELEBREX) 200 MG capsule   DULoxetine (CYMBALTA) 30 MG capsule   pregabalin (LYRICA) 75 MG capsule   History of pelvic fracture          Note: This dictation was prepared with Dragon dictation along with smaller phrase technology. Any transcriptional errors that result from this process are unintentional.

## 2017-12-15 LAB — CBC WITH DIFFERENTIAL/PLATELET
BASOS ABS: 17 {cells}/uL (ref 0–200)
BASOS PCT: 0.2 %
EOS ABS: 133 {cells}/uL (ref 15–500)
Eosinophils Relative: 1.6 %
HCT: 37.1 % (ref 35.0–45.0)
Hemoglobin: 11.9 g/dL (ref 11.7–15.5)
LYMPHS ABS: 3611 {cells}/uL (ref 850–3900)
MCH: 26.7 pg — ABNORMAL LOW (ref 27.0–33.0)
MCHC: 32.1 g/dL (ref 32.0–36.0)
MCV: 83.2 fL (ref 80.0–100.0)
MONOS PCT: 5.3 %
MPV: 9.7 fL (ref 7.5–12.5)
NEUTROS ABS: 4100 {cells}/uL (ref 1500–7800)
NEUTROS PCT: 49.4 %
PLATELETS: 327 10*3/uL (ref 140–400)
RBC: 4.46 10*6/uL (ref 3.80–5.10)
RDW: 12.8 % (ref 11.0–15.0)
TOTAL LYMPHOCYTE: 43.5 %
WBC: 8.3 10*3/uL (ref 3.8–10.8)
WBCMIX: 440 {cells}/uL (ref 200–950)

## 2017-12-15 LAB — PROTIME-INR
INR: 0.9
Prothrombin Time: 9.5 s (ref 9.0–11.5)

## 2017-12-17 ENCOUNTER — Encounter: Payer: Self-pay | Admitting: Family Medicine

## 2017-12-17 DIAGNOSIS — M62551 Muscle wasting and atrophy, not elsewhere classified, right thigh: Secondary | ICD-10-CM | POA: Diagnosis not present

## 2017-12-17 DIAGNOSIS — M79651 Pain in right thigh: Secondary | ICD-10-CM | POA: Diagnosis not present

## 2017-12-17 DIAGNOSIS — M25651 Stiffness of right hip, not elsewhere classified: Secondary | ICD-10-CM | POA: Diagnosis not present

## 2017-12-17 DIAGNOSIS — M25551 Pain in right hip: Secondary | ICD-10-CM | POA: Diagnosis not present

## 2017-12-19 DIAGNOSIS — M533 Sacrococcygeal disorders, not elsewhere classified: Secondary | ICD-10-CM | POA: Diagnosis not present

## 2017-12-19 DIAGNOSIS — M545 Low back pain: Secondary | ICD-10-CM | POA: Diagnosis not present

## 2017-12-25 DIAGNOSIS — M25651 Stiffness of right hip, not elsewhere classified: Secondary | ICD-10-CM | POA: Diagnosis not present

## 2017-12-25 DIAGNOSIS — M62551 Muscle wasting and atrophy, not elsewhere classified, right thigh: Secondary | ICD-10-CM | POA: Diagnosis not present

## 2017-12-25 DIAGNOSIS — M79651 Pain in right thigh: Secondary | ICD-10-CM | POA: Diagnosis not present

## 2017-12-25 DIAGNOSIS — M25551 Pain in right hip: Secondary | ICD-10-CM | POA: Diagnosis not present

## 2017-12-26 DIAGNOSIS — M62551 Muscle wasting and atrophy, not elsewhere classified, right thigh: Secondary | ICD-10-CM | POA: Diagnosis not present

## 2017-12-26 DIAGNOSIS — M25551 Pain in right hip: Secondary | ICD-10-CM | POA: Diagnosis not present

## 2017-12-26 DIAGNOSIS — M25651 Stiffness of right hip, not elsewhere classified: Secondary | ICD-10-CM | POA: Diagnosis not present

## 2017-12-26 DIAGNOSIS — M79651 Pain in right thigh: Secondary | ICD-10-CM | POA: Diagnosis not present

## 2017-12-27 ENCOUNTER — Other Ambulatory Visit (INDEPENDENT_AMBULATORY_CARE_PROVIDER_SITE_OTHER): Payer: Self-pay

## 2017-12-27 ENCOUNTER — Encounter (INDEPENDENT_AMBULATORY_CARE_PROVIDER_SITE_OTHER): Payer: Self-pay | Admitting: Orthopedic Surgery

## 2017-12-27 ENCOUNTER — Ambulatory Visit (INDEPENDENT_AMBULATORY_CARE_PROVIDER_SITE_OTHER): Payer: BLUE CROSS/BLUE SHIELD | Admitting: Orthopedic Surgery

## 2017-12-27 DIAGNOSIS — M5441 Lumbago with sciatica, right side: Secondary | ICD-10-CM

## 2017-12-27 DIAGNOSIS — E559 Vitamin D deficiency, unspecified: Secondary | ICD-10-CM | POA: Diagnosis not present

## 2017-12-27 MED ORDER — OXYCODONE HCL 5 MG PO CAPS: 5.0000 mg | ORAL_CAPSULE | Freq: Every evening | ORAL | 0 refills | Status: DC | PRN

## 2017-12-27 NOTE — Progress Notes (Signed)
   Post-Op Visit Note   Patient: Traci Schmidt           Date of Birth: 03-19-1981           MRN: [id number] Visit Date: 12/27/2017 PCP: Alycia Rossetti, MD   Assessment & Plan:  Chief Complaint:  Chief Complaint  Patient presents with  . Follow-up    back/labs   Visit Diagnoses:  1. Midline low back pain with right-sided sciatica, unspecified chronicity     Plan: Patient was translating for her father today.  She has not had follow-up blood work done to check her vitamin D level.  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  She has a little nerve compression as well.  Complicated problem and she is been to see some people outside of [place] to have that managed.  Her vitamin D was low and she had 12 weeks of supplemental vitamin D.  Blood work done today and she will continue with her referrals to try to see if she can get this sacral nonunion problem addressed.  I will call her with her vitamin D level result.  Follow-Up Instructions: Return if symptoms worsen or fail to improve.   Orders:  Orders Placed This Encounter  Procedures  . Vitamin D (25 hydroxy)   No orders of the defined types were placed in this encounter.   Imaging: No results found.  PMFS History: Patient Active Problem List   Diagnosis Date Noted  . Obesity (BMI 30-39.9) 06/23/2017   Past Medical History:  Diagnosis Date  . Allergy    seasonal  . Gallstones     Family History  Problem Relation Age of Onset  . Cancer Mother        breast  . Diabetes Mother   . Hypertension Mother   . Breast cancer Mother   . Hyperlipidemia Father   . Diabetes Father   . Diabetes Brother   . Hyperlipidemia Brother   . Hyperlipidemia Brother   . Colon cancer Neg Hx     Past Surgical History:  Procedure Laterality Date  . BALLOON DILATION N/A 11/12/2013   Procedure: BALLOON DILATION WITH STONE EXTRACTION;  Surgeon: Daneil Dolin, MD;   Location: [facility];  Service: Endoscopy;  Laterality: N/A;  . CESAREAN SECTION    . CHOLECYSTECTOMY N/A 11/05/2013   Dr. Arnoldo Morale  . ERCP N/A 11/12/2013   Procedure: ENDOSCOPIC RETROGRADE CHOLANGIOPANCREATOGRAPHY (ERCP), SPHINCTEROTOMY, BALLOON DILATION WITH STONE EXTRACTION;  Surgeon: Daneil Dolin, MD;  Location: [facility];  Service: Endoscopy;  Laterality: N/A;  . PELVIC FRACTURE SURGERY    . SPHINCTEROTOMY N/A 11/12/2013   Procedure: SPHINCTEROTOMY;  Surgeon: Daneil Dolin, MD;  Location: [facility];  Service: Endoscopy;  Laterality: N/A;   Social History   Occupational History  . Not on file  Tobacco Use  . Smoking status: Never Smoker  . Smokeless tobacco: Never Used  Substance and Sexual Activity  . Alcohol use: No    Frequency: Never    Comment: rarely  . Drug use: No  . Sexual activity: Yes    Birth control/protection: None

## 2017-12-28 DIAGNOSIS — M25651 Stiffness of right hip, not elsewhere classified: Secondary | ICD-10-CM | POA: Diagnosis not present

## 2017-12-28 DIAGNOSIS — M79651 Pain in right thigh: Secondary | ICD-10-CM | POA: Diagnosis not present

## 2017-12-28 DIAGNOSIS — M62551 Muscle wasting and atrophy, not elsewhere classified, right thigh: Secondary | ICD-10-CM | POA: Diagnosis not present

## 2017-12-28 DIAGNOSIS — M25551 Pain in right hip: Secondary | ICD-10-CM | POA: Diagnosis not present

## 2017-12-28 LAB — VITAMIN D 25 HYDROXY (VIT D DEFICIENCY, FRACTURES): VIT D 25 HYDROXY: 15 ng/mL — AB (ref 30–100)

## 2017-12-28 NOTE — Progress Notes (Signed)
Still low - has failed standard treatment - needs to see primary care for next step  pls send over current level and prior rx to the primary care doc

## 2018-01-01 ENCOUNTER — Encounter: Payer: Self-pay | Admitting: Family Medicine

## 2018-01-01 ENCOUNTER — Ambulatory Visit: Payer: BLUE CROSS/BLUE SHIELD | Admitting: Family Medicine

## 2018-01-01 ENCOUNTER — Other Ambulatory Visit: Payer: Self-pay

## 2018-01-01 DIAGNOSIS — M25551 Pain in right hip: Secondary | ICD-10-CM | POA: Diagnosis not present

## 2018-01-01 DIAGNOSIS — R7989 Other specified abnormal findings of blood chemistry: Secondary | ICD-10-CM | POA: Diagnosis not present

## 2018-01-01 DIAGNOSIS — E559 Vitamin D deficiency, unspecified: Secondary | ICD-10-CM | POA: Insufficient documentation

## 2018-01-01 DIAGNOSIS — M79651 Pain in right thigh: Secondary | ICD-10-CM | POA: Diagnosis not present

## 2018-01-01 DIAGNOSIS — M25651 Stiffness of right hip, not elsewhere classified: Secondary | ICD-10-CM | POA: Diagnosis not present

## 2018-01-01 DIAGNOSIS — M62551 Muscle wasting and atrophy, not elsewhere classified, right thigh: Secondary | ICD-10-CM | POA: Diagnosis not present

## 2018-01-01 NOTE — Progress Notes (Signed)
   Subjective:    Patient ID: Traci Schmidt, female    DOB: 05-May-1981, 37 y.o.   MRN: [id number]  Patient presents for Follow-up (Vit D Def- states that she has taken Vit D 50,000x12 weeks but was recently told that it's still low)  Pt here to f/u vitamin D , she was placed on vitamin D by  In Feb by orthopedisc Dr. Marlou Sa at that time level was a 64, she was given 50,000iu for 12 week, then rechecked on 7/11 and level  Was only 15    Diet- doesn't drink milk, rare veggies, eats some cheese mostly onpizza, does drink yogurt drinks   Has appt with [facility] on Thursday   Review Of Systems:  GEN- denies fatigue, fever, weight loss,weakness, recent illness HEENT- denies eye drainage, change in vision, nasal discharge, CVS- denies chest pain, palpitations RESP- denies SOB, cough, wheeze ABD- denies N/V, change in stools, abd pain GU- denies dysuria, hematuria, dribbling, incontinence MSK- + joint pain, muscle aches, injury Neuro- denies headache, dizziness, syncope, seizure activity       Objective:    BP 120/62   Pulse 70   Temp 98.1 F (36.7 C) (Oral)   Resp 14   Ht 5\' 1"  (1.549 m)   Wt 174 lb (78.9 kg)   LMP 12/09/2017 Comment: regular  SpO2 100%   BMI 32.88 kg/m  GEN- NAD, alert and oriented x3       Assessment & Plan:      Problem List Items Addressed This Visit      Unprioritized   Low vitamin D level    She gets minimal from diet or sunlight production, also has been off supplement for 2 month so unsure if she actually did respond to initial dosing, check PTH/Calcium level If those are normal, plan to treat for 6 months with 50,000IU daily along with calcium 1000mg  once a day   At end of visit asked still getting breast milk from left breast her son is now 15 months.  She did pump for a few months advised her that this can be normal she has not had any blood from the nipple or pain in the breast.  She also had a mammogram that was normal.      Relevant Orders   PTH, Intact and Calcium      Note: This dictation was prepared with Dragon dictation along with smaller phrase technology. Any transcriptional errors that result from this process are unintentional.

## 2018-01-01 NOTE — Patient Instructions (Signed)
F/U as previous 

## 2018-01-01 NOTE — Assessment & Plan Note (Addendum)
She gets minimal from diet or sunlight production, also has been off supplement for 2 month so unsure if she actually did respond to initial dosing, check PTH/Calcium level If those are normal, plan to treat for 6 months with 50,000IU daily along with calcium 1000mg  once a day   At end of visit asked still getting breast milk from left breast her son is now 15 months.  She did pump for a few months advised her that this can be normal she has not had any blood from the nipple or pain in the breast.  She also had a mammogram that was normal.

## 2018-01-02 ENCOUNTER — Other Ambulatory Visit: Payer: Self-pay | Admitting: *Deleted

## 2018-01-02 DIAGNOSIS — E559 Vitamin D deficiency, unspecified: Secondary | ICD-10-CM

## 2018-01-02 DIAGNOSIS — E210 Primary hyperparathyroidism: Secondary | ICD-10-CM

## 2018-01-02 LAB — PTH, INTACT AND CALCIUM
Calcium: 8.7 mg/dL (ref 8.6–10.2)
PTH: 103 pg/mL — ABNORMAL HIGH (ref 14–64)

## 2018-01-02 MED ORDER — VITAMIN D (ERGOCALCIFEROL) 1.25 MG (50000 UNIT) PO CAPS: 50000.0000 [IU] | ORAL_CAPSULE | ORAL | 2 refills | Status: DC

## 2018-01-03 DIAGNOSIS — M5417 Radiculopathy, lumbosacral region: Secondary | ICD-10-CM | POA: Diagnosis not present

## 2018-01-03 DIAGNOSIS — M84454A Pathological fracture, pelvis, initial encounter for fracture: Secondary | ICD-10-CM | POA: Diagnosis not present

## 2018-01-03 DIAGNOSIS — M545 Low back pain: Secondary | ICD-10-CM | POA: Diagnosis not present

## 2018-01-08 ENCOUNTER — Other Ambulatory Visit: Payer: Self-pay | Admitting: Orthopedic Surgery

## 2018-01-09 ENCOUNTER — Other Ambulatory Visit: Payer: Self-pay | Admitting: Orthopedic Surgery

## 2018-01-09 DIAGNOSIS — M84454A Pathological fracture, pelvis, initial encounter for fracture: Secondary | ICD-10-CM

## 2018-01-15 DIAGNOSIS — M25651 Stiffness of right hip, not elsewhere classified: Secondary | ICD-10-CM | POA: Diagnosis not present

## 2018-01-15 DIAGNOSIS — M62551 Muscle wasting and atrophy, not elsewhere classified, right thigh: Secondary | ICD-10-CM | POA: Diagnosis not present

## 2018-01-15 DIAGNOSIS — M79651 Pain in right thigh: Secondary | ICD-10-CM | POA: Diagnosis not present

## 2018-01-15 DIAGNOSIS — M25551 Pain in right hip: Secondary | ICD-10-CM | POA: Diagnosis not present

## 2018-01-18 ENCOUNTER — Other Ambulatory Visit: Payer: BLUE CROSS/BLUE SHIELD

## 2018-01-21 ENCOUNTER — Ambulatory Visit (INDEPENDENT_AMBULATORY_CARE_PROVIDER_SITE_OTHER): Payer: Self-pay

## 2018-01-21 ENCOUNTER — Encounter (INDEPENDENT_AMBULATORY_CARE_PROVIDER_SITE_OTHER): Payer: Self-pay | Admitting: Physician Assistant

## 2018-01-21 ENCOUNTER — Ambulatory Visit (INDEPENDENT_AMBULATORY_CARE_PROVIDER_SITE_OTHER): Payer: BLUE CROSS/BLUE SHIELD | Admitting: Physician Assistant

## 2018-01-21 DIAGNOSIS — M25571 Pain in right ankle and joints of right foot: Secondary | ICD-10-CM | POA: Diagnosis not present

## 2018-01-21 MED ORDER — DICLOFENAC SODIUM 1 % TD GEL: 2.0000 g | Freq: Four times a day (QID) | TRANSDERMAL | 1 refills | Status: DC

## 2018-01-21 NOTE — Progress Notes (Signed)
Office Visit Note   Patient: Traci Schmidt           Date of Birth: Sep 21, 1980           MRN: [id number] Visit Date: 01/21/2018              Requested by: Alycia Rossetti, MD [address] [place],  [id number] PCP: Alycia Rossetti, MD   Assessment & Plan: Visit Diagnoses:  1. Pain in right ankle and joints of right foot     Plan: Impression is right ankle sprain and peroneal tendinitis.  We will place the patient in a cam walker weightbearing as tolerated.  She will ice and elevate for swelling.  I will also call in Voltaren gel to use as needed.  She will follow-up with [place] in 4 weeks time for recheck.  Follow-Up Instructions: Return in about 1 month (around 02/18/2018).   Orders:  Orders Placed This Encounter  Procedures  . XR Ankle Complete Right  . XR Foot Complete Right   Meds ordered this encounter  Medications  . diclofenac sodium (VOLTAREN) 1 % GEL    Sig: Apply 2 g topically 4 (four) times daily.    Dispense:  1 Tube    Refill:  1      Procedures: No procedures performed   Clinical Data: No additional findings.   Subjective: Chief Complaint  Patient presents with  . Right Ankle - Pain    HPI patient is a pleasant 37 year old female who presents to our clinic today with right ankle pain.  She notes an inversion injury to the right ankle approximately 1 week ago.  The pain she is having is to the lateral aspect of her ankle and foot.  Worse with bearing weight as well as inversion of the ankle.  She has been applying ice and elevating as well as using Biofreeze without much relief of symptoms.  No numbness, tingling or burning.  She does note to previous ankle sprains to the right ankle.  She also notes a previous fracture several years back for which  she was seen in [place]  Review of systems as detailed in HPI.  All others reviewed and are negative.  Objective: Vital Signs: There were no vitals taken for this visit.  Physical Exam  well-developed and well-nourished female in no acute distress.  Alert and oriented x3.  Ortho Exam examination of the right ankle reveals mild swelling.  No tenderness over the lateral malleolus.  Moderate tenderness throughout the peroneal tendon as well as the fifth metatarsal.  Tenderness over the ATFL.  Negative anterior drawer and talar tilt.  Increased pain with inversion of the ankle.  She is neurovascularly intact distally.  Specialty Comments:  No specialty comments available.  Imaging: Xr Ankle Complete Right  Result Date: 01/21/2018 X-rays of the right ankle show an old avulsion fracture to the lateral malleolus  Xr Foot Complete Right  Result Date: 01/21/2018 No acute fracture of the foot    PMFS History: Patient Active Problem List   Diagnosis Date Noted  . Pain in right ankle and joints of right foot 01/21/2018  . Low vitamin D level 01/01/2018  . Obesity (BMI 30-39.9) 06/23/2017   Past Medical History:  Diagnosis Date  . Allergy    seasonal  . Gallstones     Family History  Problem Relation Age of Onset  . Cancer Mother        breast  . Diabetes Mother   .  Hypertension Mother   . Breast cancer Mother   . Hyperlipidemia Father   . Diabetes Father   . Diabetes Brother   . Hyperlipidemia Brother   . Hyperlipidemia Brother   . Colon cancer Neg Hx     Past Surgical History:  Procedure Laterality Date  . BALLOON DILATION N/A 11/12/2013   Procedure: BALLOON DILATION WITH STONE EXTRACTION;  Surgeon: Daneil Dolin, MD;  Location: [facility];  Service: Endoscopy;  Laterality: N/A;  . CESAREAN SECTION    . CHOLECYSTECTOMY N/A 11/05/2013   Dr. Arnoldo Morale  . ERCP N/A 11/12/2013   Procedure: ENDOSCOPIC RETROGRADE CHOLANGIOPANCREATOGRAPHY (ERCP), SPHINCTEROTOMY, BALLOON DILATION WITH STONE EXTRACTION;  Surgeon: Daneil Dolin, MD;  Location: [facility];  Service: Endoscopy;  Laterality: N/A;  . PELVIC FRACTURE SURGERY    . SPHINCTEROTOMY N/A 11/12/2013   Procedure:  SPHINCTEROTOMY;  Surgeon: Daneil Dolin, MD;  Location: [facility];  Service: Endoscopy;  Laterality: N/A;   Social History   Occupational History  . Not on file  Tobacco Use  . Smoking status: Never Smoker  . Smokeless tobacco: Never Used  Substance and Sexual Activity  . Alcohol use: No    Frequency: Never    Comment: rarely  . Drug use: No  . Sexual activity: Yes    Birth control/protection: None

## 2018-01-22 ENCOUNTER — Ambulatory Visit: Payer: BLUE CROSS/BLUE SHIELD | Admitting: Family Medicine

## 2018-01-22 ENCOUNTER — Encounter: Payer: Self-pay | Admitting: Family Medicine

## 2018-01-22 ENCOUNTER — Other Ambulatory Visit: Payer: Self-pay

## 2018-01-22 VITALS — BP 118/58 | HR 72 | Temp 98.1°F | Resp 14 | Ht 61.0 in | Wt 174.0 lb

## 2018-01-22 DIAGNOSIS — M79651 Pain in right thigh: Secondary | ICD-10-CM | POA: Diagnosis not present

## 2018-01-22 DIAGNOSIS — N938 Other specified abnormal uterine and vaginal bleeding: Secondary | ICD-10-CM | POA: Diagnosis not present

## 2018-01-22 DIAGNOSIS — D219 Benign neoplasm of connective and other soft tissue, unspecified: Secondary | ICD-10-CM

## 2018-01-22 DIAGNOSIS — M25551 Pain in right hip: Secondary | ICD-10-CM | POA: Diagnosis not present

## 2018-01-22 DIAGNOSIS — M25651 Stiffness of right hip, not elsewhere classified: Secondary | ICD-10-CM | POA: Diagnosis not present

## 2018-01-22 DIAGNOSIS — M62551 Muscle wasting and atrophy, not elsewhere classified, right thigh: Secondary | ICD-10-CM | POA: Diagnosis not present

## 2018-01-22 LAB — CBC WITH DIFFERENTIAL/PLATELET
BASOS ABS: 19 {cells}/uL (ref 0–200)
Basophils Relative: 0.2 %
EOS PCT: 1.8 %
Eosinophils Absolute: 167 cells/uL (ref 15–500)
HCT: 37.1 % (ref 35.0–45.0)
HEMOGLOBIN: 12.1 g/dL (ref 11.7–15.5)
LYMPHS ABS: 4120 {cells}/uL — AB (ref 850–3900)
MCH: 26.5 pg — ABNORMAL LOW (ref 27.0–33.0)
MCHC: 32.6 g/dL (ref 32.0–36.0)
MCV: 81.4 fL (ref 80.0–100.0)
MONOS PCT: 5 %
MPV: 9.6 fL (ref 7.5–12.5)
Neutro Abs: 4529 cells/uL (ref 1500–7800)
Neutrophils Relative %: 48.7 %
Platelets: 382 10*3/uL (ref 140–400)
RBC: 4.56 10*6/uL (ref 3.80–5.10)
RDW: 13 % (ref 11.0–15.0)
Total Lymphocyte: 44.3 %
WBC: 9.3 10*3/uL (ref 3.8–10.8)
WBCMIX: 465 {cells}/uL (ref 200–950)

## 2018-01-22 LAB — PREGNANCY, URINE: PREG TEST UR: NEGATIVE

## 2018-01-22 MED ORDER — NORGESTIMATE-ETH ESTRADIOL 0.25-35 MG-MCG PO TABS: 1.0000 | ORAL_TABLET | Freq: Every day | ORAL | 0 refills | Status: DC

## 2018-01-22 NOTE — Progress Notes (Signed)
   Subjective:    Patient ID: Traci Schmidt, female    DOB: 13-Sep-1980, 37 y.o.   MRN: [id number]  Patient presents for DUB (increased bleeding x2 weeks- increased bright red liquidy discharge )  Pt here to with heavy menstrual bleeding for the past 2 weeks.   Menstrual cycles typically  25-28 days   Using at least 5 heavy pads a day, today has already used 3 pads  Does not seem any clots.  Prior to this month her periods have been regular.  She denies any significant cramping or pain no dysuria.  No change in her bowels.  She has been on Celebrex for months now because of her chronic pain.  She does remember she was told she had a fibroid in her uterus at her 42-month prenatal ultrasound Has not had any syncopal events headaches feeling lightheaded.   Had a recent ankle sprain has been seen by orthopedics.      Review Of Systems:  GEN- denies fatigue, fever, weight loss,weakness, recent illness HEENT- denies eye drainage, change in vision, nasal discharge, CVS- denies chest pain, palpitations RESP- denies SOB, cough, wheeze ABD- denies N/V, change in stools, abd pain GU- denies dysuria, hematuria, dribbling, incontinence MSK- denies joint pain, muscle aches, injury Neuro- denies headache, dizziness, syncope, seizure activity       Objective:    BP (!) 118/58   Pulse 72   Temp 98.1 F (36.7 C) (Oral)   Resp 14   Ht 5\' 1"  (1.549 m)   Wt 174 lb (78.9 kg)   SpO2 98%   BMI 32.88 kg/m  GEN- NAD, alert and oriented x3 HEENT- PERRL, EOMI, non injected sclera, pink conjunctiva, MMM, oropharynx clear CVS- RRR, no murmur RESP-CTAB ABD-NABS,soft,NT,ND EXT- No edema Pulses- Radial  2+        Assessment & Plan:      Problem List Items Addressed This Visit    None    Visit Diagnoses    DUB (dysfunctional uterine bleeding)    -  Primary   Upreg neg, check CBC with heavy prolonged bleeding. With her young age query if hormonal or due to bleeding fibroid tumors. Will send  to GYN for Ultrasound/work up  Will start high dose OCP, will have her take 2 tablets of estrogen/progesterone combo to combat the bleeding   Relevant Orders   Pregnancy, urine (Completed)   CBC with Differential/Platelet   Fibroid tumor          Note: This dictation was prepared with Dragon dictation along with smaller phrase technology. Any transcriptional errors that result from this process are unintentional.

## 2018-01-22 NOTE — Patient Instructions (Signed)
Pregnancy test is negative Take birth control 2 tablets a day, except the placebo week Referral to GYN for ultrasound  F/U pending results

## 2018-01-29 DIAGNOSIS — M25551 Pain in right hip: Secondary | ICD-10-CM | POA: Diagnosis not present

## 2018-01-29 DIAGNOSIS — M79651 Pain in right thigh: Secondary | ICD-10-CM | POA: Diagnosis not present

## 2018-01-29 DIAGNOSIS — M62551 Muscle wasting and atrophy, not elsewhere classified, right thigh: Secondary | ICD-10-CM | POA: Diagnosis not present

## 2018-01-29 DIAGNOSIS — M25651 Stiffness of right hip, not elsewhere classified: Secondary | ICD-10-CM | POA: Diagnosis not present

## 2018-01-30 ENCOUNTER — Ambulatory Visit: Payer: BLUE CROSS/BLUE SHIELD | Admitting: Obstetrics and Gynecology

## 2018-01-30 ENCOUNTER — Other Ambulatory Visit: Payer: Self-pay

## 2018-01-30 ENCOUNTER — Encounter: Payer: Self-pay | Admitting: Obstetrics and Gynecology

## 2018-01-30 VITALS — BP 118/70 | HR 84 | Ht 62.0 in | Wt 174.2 lb

## 2018-01-30 DIAGNOSIS — K5900 Constipation, unspecified: Secondary | ICD-10-CM

## 2018-01-30 DIAGNOSIS — N939 Abnormal uterine and vaginal bleeding, unspecified: Secondary | ICD-10-CM | POA: Diagnosis not present

## 2018-01-30 DIAGNOSIS — N970 Female infertility associated with anovulation: Secondary | ICD-10-CM | POA: Diagnosis not present

## 2018-01-30 DIAGNOSIS — N819 Female genital prolapse, unspecified: Secondary | ICD-10-CM | POA: Diagnosis not present

## 2018-01-30 DIAGNOSIS — Z124 Encounter for screening for malignant neoplasm of cervix: Secondary | ICD-10-CM

## 2018-01-30 MED ORDER — MEDROXYPROGESTERONE ACETATE 5 MG PO TABS: 5.0000 mg | ORAL_TABLET | Freq: Every day | ORAL | 0 refills | Status: DC

## 2018-01-30 NOTE — Progress Notes (Addendum)
37 y.o. [id number] MarriedIndianF here for a consultation from Dr Buelah Manis for abnormal uterine bleeding. Cycles are typically monthly x 5-7 days. Uses about 3 pads a day at most. No BTB.  Her last cycle started ~01/07/18. Bleed for at least 2 weeks. She was going a pad in up to 1.5 hours. Normal Hgb and negative UPT with her primary 8 days ago. Not sexually active in the last year. Last cycle wasn't late.  Primary gave her high dose OCP's, took them for 3 days, bleeding stopped. She stopped at 3 days secondary to nausea.  She does c/o constipation, getting worse. She does c/o hair loss, no dry skin, no increase in fatigue Period Cycle (Days): 25 Period Duration (Days): 15 days Period Pattern: Regular Menstrual Flow: Heavy Menstrual Control: Maxi pad, Thin pad Menstrual Control Change Freq (Hours): changes pad every 4 hours Dysmenorrhea: None   Since the birth of her last child 16 months ago she has been having bad hip and right leg pain. On lyrica, helping some. She also has a h/o a pelvic fracture.   She was told she had a fibroid when she was pregnant.  CT in 2/19 reports no pelvic masses or adenopathy.   Patient's last menstrual period was 01/07/2018 (approximate).          Sexually active: no The current method of family planning is none.    Exercising: No.  The patient does not participate in regular exercise at present. Smoker:  no  Health Maintenance: Pap:  Patient is unsure History of abnormal Pap:  No MMG: 07/31/2017 Birads 1 negative TDaP:  Up to date per patient Gardasil: None   reports that she has never smoked. She has never used smokeless tobacco. She reports that she does not drink alcohol or use drugs.  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 2 girls and a boy.   Past Medical History:  Diagnosis Date  . Abnormal uterine bleeding   . Allergy    seasonal  . Fibroid   . Gallstones     Past Surgical History:  Procedure Laterality Date  . BALLOON DILATION N/A  11/12/2013   Procedure: BALLOON DILATION WITH STONE EXTRACTION;  Surgeon: Daneil Dolin, MD;  Location: [facility];  Service: Endoscopy;  Laterality: N/A;  . CESAREAN SECTION    . CHOLECYSTECTOMY N/A 11/05/2013   Dr. Arnoldo Morale  . ERCP N/A 11/12/2013   Procedure: ENDOSCOPIC RETROGRADE CHOLANGIOPANCREATOGRAPHY (ERCP), SPHINCTEROTOMY, BALLOON DILATION WITH STONE EXTRACTION;  Surgeon: Daneil Dolin, MD;  Location: [facility];  Service: Endoscopy;  Laterality: N/A;  . PELVIC FRACTURE SURGERY    . SPHINCTEROTOMY N/A 11/12/2013   Procedure: SPHINCTEROTOMY;  Surgeon: Daneil Dolin, MD;  Location: [facility];  Service: Endoscopy;  Laterality: N/A;    Current Outpatient Medications  Medication Sig Dispense Refill  . celecoxib (CELEBREX) 200 MG capsule Take by mouth.    . DULoxetine (CYMBALTA) 30 MG capsule Take by mouth.    Marland Kitchen ibuprofen (ADVIL,MOTRIN) 800 MG tablet TAKE 1 TABLET(800 MG) BY MOUTH TWICE DAILY 30 tablet 0  . oxycodone (OXY-IR) 5 MG capsule Take 1 capsule (5 mg total) by mouth at bedtime as needed. 35 capsule 0  . pregabalin (LYRICA) 75 MG capsule Take by mouth.    . Vitamin D, Ergocalciferol, (DRISDOL) 50000 units CAPS capsule Take 1 capsule (50,000 Units total) by mouth every 7 (seven) days. x6 months. 12 capsule 2  . diclofenac sodium (VOLTAREN) 1 % GEL Apply 2 g topically 4 (  four) times daily. (Patient not taking: Reported on 01/30/2018) 1 Tube 1   No current facility-administered medications for this visit.     Family History  Problem Relation Age of Onset  . Cancer Mother        breast  . Diabetes Mother   . Hypertension Mother   . Breast cancer Mother   . Hyperlipidemia Father   . Diabetes Father   . Diabetes Brother   . Hyperlipidemia Brother   . Hyperlipidemia Brother   . Colon cancer Neg Hx   Mom is 75, breast cancer at 44  Review of Systems  Constitutional: Negative.   HENT: Negative.   Eyes: Negative.   Respiratory: Negative.   Cardiovascular: Negative.   Gastrointestinal:  Negative.   Endocrine: Negative.   Genitourinary:       Irregular menses Extended menses  Musculoskeletal: Negative.   Skin: Negative.   Allergic/Immunologic: Negative.   Neurological: Negative.   Hematological: Negative.   Psychiatric/Behavioral: Negative.   No urinary incontinence, feels she empties her bladder Denies vaginal bulge or discomfort  Exam:   BP 118/70 (BP Location: Right Arm, Patient Position: Sitting, Cuff Size: Normal)   Pulse 84   Ht 5\' 2"  (1.575 m)   Wt 174 lb 3.2 oz (79 kg)   LMP 01/07/2018 (Approximate)   BMI 31.86 kg/m   Weight change: @WEIGHTCHANGE @ Height:   Height: 5\' 2"  (157.5 cm)  Ht Readings from Last 3 Encounters:  01/30/18 5\' 2"  (1.575 m)  01/22/18 5\' 1"  (1.549 m)  01/01/18 5\' 1"  (1.549 m)    General appearance: alert, cooperative and appears stated age Head: Normocephalic, without obvious abnormality, atraumatic Neck: no adenopathy, supple, symmetrical, trachea midline and thyroid normal to inspection and palpation Lungs: clear to auscultation bilaterally Cardiovascular: regular rate and rhythm Abdomen: soft, non-tender; non distended,  no masses,  no organomegaly Extremities: extremities normal, atraumatic, no cyanosis or edema Skin: Skin color, texture, turgor normal. No rashes or lesions Lymph nodes: Cervical, supraclavicular, and axillary nodes normal. No abnormal inguinal nodes palpated Neurologic: Grossly normal   Pelvic: External genitalia:  no lesions              Urethra:  normal appearing urethra with no masses, tenderness or lesions              Bartholins and Skenes: normal                 Vagina: normal appearing vagina   Large grade 2 cystocele, smaller grade 2 rectocele, grade one uterine prolapse (not symptomatic)              Cervix: no lesions               Bimanual Exam:  Uterus:  retroverted, mobile, top normal sized, not tender              Adnexa: no mass, fullness, tenderness               Rectovaginal: Confirms                Anus:  normal sphincter tone, no lesions  Chaperone was present for exam.  A:  1 episode of AUB, suspect anovulatory bleed. Otherwise normal cycles   Genital prolapse, not symptomatic.   Constipation, just started miralax  H/O fibroid, no "pelvic masses" on recent CT, normal exam. Fibroid must be small. I doubt it is the cause of her recent bleed  P:   TSH  Provera  5 mg x 5 days  Calendar cycles  F/U in 3 months, sooner with concerns  If she has persistent abnormal bleeding will schedule an ultrasound  Discussed prolapse, ACOG handout given  Avoid constipation and heavy lifting  CC: Dr Buelah Manis Note sent  Addendum: pap with hpv sent

## 2018-01-31 ENCOUNTER — Other Ambulatory Visit (HOSPITAL_COMMUNITY)
Admission: RE | Admit: 2018-01-31 | Discharge: 2018-01-31 | Disposition: A | Discharge status: Home / Self Care | Payer: BLUE CROSS/BLUE SHIELD | Source: Ambulatory Visit | Attending: Obstetrics and Gynecology | Admitting: Obstetrics and Gynecology

## 2018-01-31 DIAGNOSIS — Z124 Encounter for screening for malignant neoplasm of cervix: Secondary | ICD-10-CM | POA: Diagnosis not present

## 2018-01-31 DIAGNOSIS — N939 Abnormal uterine and vaginal bleeding, unspecified: Secondary | ICD-10-CM | POA: Insufficient documentation

## 2018-01-31 LAB — TSH: TSH: 2.04 u[IU]/mL (ref 0.450–4.500)

## 2018-01-31 NOTE — Addendum Note (Signed)
Addended by: Dorothy Spark on: 01/31/2018 01:28 PM   Modules accepted: Orders

## 2018-02-01 LAB — CYTOLOGY - PAP
Diagnosis: NEGATIVE
HPV (WINDOPATH): NOT DETECTED

## 2018-02-05 DIAGNOSIS — M2170 Unequal limb length (acquired), unspecified site: Secondary | ICD-10-CM | POA: Diagnosis not present

## 2018-02-05 DIAGNOSIS — M7601 Gluteal tendinitis, right hip: Secondary | ICD-10-CM | POA: Diagnosis not present

## 2018-02-06 DIAGNOSIS — M62551 Muscle wasting and atrophy, not elsewhere classified, right thigh: Secondary | ICD-10-CM | POA: Diagnosis not present

## 2018-02-06 DIAGNOSIS — M25551 Pain in right hip: Secondary | ICD-10-CM | POA: Diagnosis not present

## 2018-02-06 DIAGNOSIS — M79651 Pain in right thigh: Secondary | ICD-10-CM | POA: Diagnosis not present

## 2018-02-06 DIAGNOSIS — M25651 Stiffness of right hip, not elsewhere classified: Secondary | ICD-10-CM | POA: Diagnosis not present

## 2018-02-12 DIAGNOSIS — Z23 Encounter for immunization: Secondary | ICD-10-CM | POA: Diagnosis not present

## 2018-02-13 ENCOUNTER — Encounter (INDEPENDENT_AMBULATORY_CARE_PROVIDER_SITE_OTHER): Payer: Self-pay | Admitting: Orthopedic Surgery

## 2018-02-13 ENCOUNTER — Ambulatory Visit (INDEPENDENT_AMBULATORY_CARE_PROVIDER_SITE_OTHER): Payer: Self-pay

## 2018-02-13 ENCOUNTER — Ambulatory Visit (INDEPENDENT_AMBULATORY_CARE_PROVIDER_SITE_OTHER): Payer: BLUE CROSS/BLUE SHIELD | Admitting: Orthopedic Surgery

## 2018-02-13 DIAGNOSIS — M25371 Other instability, right ankle: Secondary | ICD-10-CM

## 2018-02-13 DIAGNOSIS — M25562 Pain in left knee: Secondary | ICD-10-CM | POA: Diagnosis not present

## 2018-02-13 NOTE — Progress Notes (Signed)
Office Visit Note   Patient: Traci Schmidt           Date of Birth: 1980/12/31           MRN: [id number] Visit Date: 02/13/2018 Requested by: Alycia Rossetti, MD [address] [place], [place] [address] PCP: Alycia Rossetti, MD  Subjective: Chief Complaint  Patient presents with  . Lower Back - Follow-up  . Right Ankle - Pain  . Left Knee - Pain    HPI: Patient presents for follow-up of low back issues as well as evaluation of right ankle and left knee.  The patient describes having right ankle instability.  She states that the ankle rolls.  She uses a compression sleeve.  She also feels like the right foot feels heavy and numb.  She has a history of fibular fracture in 2002.  She also has known issues with the L5-S1 nerve roots due to her pelvic issues.  Patient also describes left knee pain.  Denies any fall injury.  Denies any mechanical symptoms.  Uses Biofreeze and also takes Advil for the problem.  Patient takes Celebrex for these problems.  She is getting worked up at [facility] for this back issue which is a very complex issue.  She states that the left knee hurts in the anterior medial region for about a month.              ROS: All systems reviewed are negative as they relate to the chief complaint within the history of present illness.  Patient denies  fevers or chills.   Assessment & Plan: Visit Diagnoses:  1. Acute pain of left knee   2. Instability of right ankle joint     Plan: Impression is right ankle instability which is multifactorial.  She has nonunion of a fibular tip fracture which would adversely affect the calcaneofibular ligament.  She also has weakness of the everters which would also affect ankle stability.  This is borne out on physical examination.  In regards to the left knee the patient has medial compartment arthritis.  She is symptomatic from knee arthritis.  I think that injection would be the next step for that left knee.  In regards to the  ankle I like for her to try wearing an ASO.  That ankle may require surgical stabilization but really want to delay that until the back treatment plan is resolved.  Follow-Up Instructions: Return if symptoms worsen or fail to improve.   Orders:  Orders Placed This Encounter  Procedures  . XR KNEE 3 VIEW LEFT   No orders of the defined types were placed in this encounter.     Procedures: No procedures performed   Clinical Data: No additional findings.  Objective: Vital Signs: There were no vitals taken for this visit.  Physical Exam:   Constitutional: Patient appears well-developed HEENT:  Head: Normocephalic Eyes:EOM are normal Neck: Normal range of motion Cardiovascular: Normal rate Pulmonary/chest: Effort normal Neurologic: Patient is alert Skin: Skin is warm Psychiatric: Patient has normal mood and affect    Ortho Exam: Ortho examination demonstrates pretty normal gait alignment.  Right ankle has weakness with eversion and some weakness with plantar flexion compared to the left.  Pedal pulses palpable.  Patient has instability and talar tilt positive on the right compared to the left with manual testing of varus stress.  Anterior drawer testing is pretty symmetric in both ankles.  Ankle dorsiflexion plantarflexion also intact.  Examination of the  left knee demonstrates no effusion but medial joint line tenderness is present.  Pedal pulses palpable.  Negative patellar apprehension.  No tenderness of the patellar or quad tendon is present.  No other masses lymph adenopathy or skin changes noted in that knee region.  No groin pain on the left or right.  Patient does have some paresthesias in the right L5 and S1 distribution on the right-hand side.  Specialty Comments:  No specialty comments available.  Imaging: Xr Knee 3 View Left  Result Date: 02/13/2018 [facility] lateral merchant left knee reviewed.  Medial compartment arthritis is present with significant joint space  narrowing.  Milder changes present in the lateral compartment and patellofemoral compartment.  No fracture present.    PMFS History: Patient Active Problem List   Diagnosis Date Noted  . Pain in right ankle and joints of right foot 01/21/2018  . Low vitamin D level 01/01/2018  . Obesity (BMI 30-39.9) 06/23/2017   Past Medical History:  Diagnosis Date  . Abnormal uterine bleeding   . Allergy    seasonal  . Fibroid   . Gallstones     Family History  Problem Relation Age of Onset  . Cancer Mother        breast  . Diabetes Mother   . Hypertension Mother   . Breast cancer Mother   . Hyperlipidemia Father   . Diabetes Father   . Diabetes Brother   . Hyperlipidemia Brother   . Hyperlipidemia Brother   . Colon cancer Neg Hx     Past Surgical History:  Procedure Laterality Date  . BALLOON DILATION N/A 11/12/2013   Procedure: BALLOON DILATION WITH STONE EXTRACTION;  Surgeon: Daneil Dolin, MD;  Location: [facility];  Service: Endoscopy;  Laterality: N/A;  . CESAREAN SECTION    . CHOLECYSTECTOMY N/A 11/05/2013   Dr. Arnoldo Morale  . ERCP N/A 11/12/2013   Procedure: ENDOSCOPIC RETROGRADE CHOLANGIOPANCREATOGRAPHY (ERCP), SPHINCTEROTOMY, BALLOON DILATION WITH STONE EXTRACTION;  Surgeon: Daneil Dolin, MD;  Location: [facility];  Service: Endoscopy;  Laterality: N/A;  . PELVIC FRACTURE SURGERY    . SPHINCTEROTOMY N/A 11/12/2013   Procedure: SPHINCTEROTOMY;  Surgeon: Daneil Dolin, MD;  Location: [facility];  Service: Endoscopy;  Laterality: N/A;   Social History   Occupational History  . Not on file  Tobacco Use  . Smoking status: Never Smoker  . Smokeless tobacco: Never Used  Substance and Sexual Activity  . Alcohol use: No    Frequency: Never    Comment: rarely  . Drug use: No  . Sexual activity: Yes    Birth control/protection: None

## 2018-02-14 ENCOUNTER — Other Ambulatory Visit: Payer: Self-pay | Admitting: Family Medicine

## 2018-02-14 DIAGNOSIS — M7601 Gluteal tendinitis, right hip: Secondary | ICD-10-CM | POA: Diagnosis not present

## 2018-02-20 ENCOUNTER — Ambulatory Visit
Admission: RE | Admit: 2018-02-20 | Discharge: 2018-02-20 | Disposition: A | Discharge status: Home / Self Care | Payer: BLUE CROSS/BLUE SHIELD | Source: Ambulatory Visit | Attending: Orthopedic Surgery | Admitting: Orthopedic Surgery

## 2018-02-20 DIAGNOSIS — M84454A Pathological fracture, pelvis, initial encounter for fracture: Secondary | ICD-10-CM

## 2018-02-20 DIAGNOSIS — M25551 Pain in right hip: Secondary | ICD-10-CM | POA: Diagnosis not present

## 2018-02-20 DIAGNOSIS — M62551 Muscle wasting and atrophy, not elsewhere classified, right thigh: Secondary | ICD-10-CM | POA: Diagnosis not present

## 2018-02-20 DIAGNOSIS — M25651 Stiffness of right hip, not elsewhere classified: Secondary | ICD-10-CM | POA: Diagnosis not present

## 2018-02-20 DIAGNOSIS — M79651 Pain in right thigh: Secondary | ICD-10-CM | POA: Diagnosis not present

## 2018-02-20 DIAGNOSIS — M1611 Unilateral primary osteoarthritis, right hip: Secondary | ICD-10-CM | POA: Diagnosis not present

## 2018-02-26 DIAGNOSIS — M461 Sacroiliitis, not elsewhere classified: Secondary | ICD-10-CM | POA: Diagnosis not present

## 2018-02-26 DIAGNOSIS — M533 Sacrococcygeal disorders, not elsewhere classified: Secondary | ICD-10-CM | POA: Diagnosis not present

## 2018-02-26 DIAGNOSIS — M545 Low back pain: Secondary | ICD-10-CM | POA: Diagnosis not present

## 2018-03-12 ENCOUNTER — Telehealth: Payer: Self-pay | Admitting: Obstetrics and Gynecology

## 2018-03-12 DIAGNOSIS — S329XXK Fracture of unspecified parts of lumbosacral spine and pelvis, subsequent encounter for fracture with nonunion: Secondary | ICD-10-CM | POA: Diagnosis not present

## 2018-03-12 DIAGNOSIS — M5417 Radiculopathy, lumbosacral region: Secondary | ICD-10-CM | POA: Diagnosis not present

## 2018-03-12 DIAGNOSIS — N939 Abnormal uterine and vaginal bleeding, unspecified: Secondary | ICD-10-CM

## 2018-03-12 NOTE — Telephone Encounter (Signed)
Left message to call Indie Nickerson at [phone].  

## 2018-03-12 NOTE — Telephone Encounter (Signed)
Patient stated that Dr. Talbert Nan wanted an update on her bleeding. Patient stated that she started bleeding 02/20/18, and has not stopped. Patient stated that the first 10 days were heavy, and then 3 days of spotting, and then back to heavy bleeding. Patient stated that she is using about 4 pads in a 10 hour time frame.

## 2018-03-12 NOTE — Telephone Encounter (Signed)
Spoke with patient.   Menses started on its own 01/31/18. Patient started Provera on 8/15 after menses started. Took 5mg  x 5 days. Completed cycle. Menses started again on 9/4, has not stopped. Currently changing pad q8hrs. Denies pelvic pain, fatigue, weakness, SOB, lightheadedness.   Patient states was to calendar menses and f/u 86mo, earlier if concerns. PUS if AUB continued.   Advised will review with Dr. Talbert Nan and return call, patient agreeable.   Dr. Talbert Nan -please advise.

## 2018-03-13 NOTE — Telephone Encounter (Signed)
Please set her up for an ultrasound and f/u visit

## 2018-03-14 NOTE — Telephone Encounter (Signed)
Spoke with patient, advised as seen below per Dr. Talbert Nan. PUS scheduled for 10/1 at 2pm, consult at 2:30pm with Dr. Talbert Nan. Order placed for precert. Patient aware will be called with benefits prior to PUS.   Routing to [employer] for [employer].  Patient is agreeable to disposition. Will close encounter.

## 2018-03-15 DIAGNOSIS — S329XXK Fracture of unspecified parts of lumbosacral spine and pelvis, subsequent encounter for fracture with nonunion: Secondary | ICD-10-CM | POA: Insufficient documentation

## 2018-03-15 DIAGNOSIS — M5417 Radiculopathy, lumbosacral region: Secondary | ICD-10-CM | POA: Insufficient documentation

## 2018-03-18 NOTE — Progress Notes (Signed)
GYNECOLOGY  VISIT   HPI: 37 y.o.   Married Asian Not Hispanic or Latino  female   [phone] with Patient's last menstrual period was 02/20/2018 (exact date).   here for consult following PUS.  The patient started having AUB over the summer, prior to July she was having monthly cycles. At the end of July she had a 2 week cycle, she started bleeding on 01/31/18, started provera on 8/16 for a presumed anovulatory bleed. She took provera x 5 days and continued to bleed. On 8/30 her bleeding stopped for 4 days then she started bleeding again on 9/4 and is still bleeding. Changing 2-4 pads a day. Not light headed or dizzy. No significant cramping.  Not anemic, normal TSH, normal pap in August. Not sexually active currently because of her bleeding. Uses condoms typically.  She had a pelvic fracture in her 20's, May need surgery on her lower back to treat nerve pain. She suffers from chronic pain.    GYNECOLOGIC HISTORY: Patient's last menstrual period was 02/20/2018 (exact date). Contraception: None Menopausal hormone therapy: None        OB History    Gravida  3   Para  3   Term  3   Preterm  0   AB  0   Living  3     SAB  0   TAB  0   Ectopic  0   Multiple  0   Live Births  3              Patient Active Problem List   Diagnosis Date Noted  . Pain in right ankle and joints of right foot 01/21/2018  . Low vitamin D level 01/01/2018  . Obesity (BMI 30-39.9) 06/23/2017    Past Medical History:  Diagnosis Date  . Abnormal uterine bleeding   . Allergy    seasonal  . Fibroid   . Gallstones     Past Surgical History:  Procedure Laterality Date  . BALLOON DILATION N/A 11/12/2013   Procedure: BALLOON DILATION WITH STONE EXTRACTION;  Surgeon: Daneil Dolin, MD;  Location: [facility];  Service: Endoscopy;  Laterality: N/A;  . CESAREAN SECTION    . CHOLECYSTECTOMY N/A 11/05/2013   Dr. Arnoldo Morale  . ERCP N/A 11/12/2013   Procedure: ENDOSCOPIC RETROGRADE CHOLANGIOPANCREATOGRAPHY  (ERCP), SPHINCTEROTOMY, BALLOON DILATION WITH STONE EXTRACTION;  Surgeon: Daneil Dolin, MD;  Location: [facility];  Service: Endoscopy;  Laterality: N/A;  . PELVIC FRACTURE SURGERY    . SPHINCTEROTOMY N/A 11/12/2013   Procedure: SPHINCTEROTOMY;  Surgeon: Daneil Dolin, MD;  Location: [facility];  Service: Endoscopy;  Laterality: N/A;    Current Outpatient Medications  Medication Sig Dispense Refill  . celecoxib (CELEBREX) 200 MG capsule Take by mouth.    . diclofenac sodium (VOLTAREN) 1 % GEL Apply 2 g topically 4 (four) times daily. 1 Tube 1  . DULoxetine (CYMBALTA) 30 MG capsule Take by mouth.    Marland Kitchen ibuprofen (ADVIL,MOTRIN) 800 MG tablet TAKE 1 TABLET(800 MG) BY MOUTH TWICE DAILY 30 tablet 0  . oxycodone (OXY-IR) 5 MG capsule Take 1 capsule (5 mg total) by mouth at bedtime as needed. 35 capsule 0  . pregabalin (LYRICA) 75 MG capsule Take by mouth.    . Vitamin D, Ergocalciferol, (DRISDOL) 50000 units CAPS capsule Take 1 capsule (50,000 Units total) by mouth every 7 (seven) days. x6 months. 12 capsule 2   No current facility-administered medications for this visit.      ALLERGIES:  Patient has no known allergies.  Family History  Problem Relation Age of Onset  . Cancer Mother        breast  . Diabetes Mother   . Hypertension Mother   . Breast cancer Mother   . Hyperlipidemia Father   . Diabetes Father   . Diabetes Brother   . Hyperlipidemia Brother   . Hyperlipidemia Brother   . Colon cancer Neg Hx     Social History   Socioeconomic History  . Marital status: Married    Spouse name: Not on file  . Number of children: 3  . Years of education: Not on file  . Highest education level: Not on file  Occupational History  . Not on file  Social Needs  . Financial resource strain: Not hard at all  . Food insecurity:    Worry: Never true    Inability: Never true  . Transportation needs:    Medical: No    Non-medical: No  Tobacco Use  . Smoking status: Never Smoker  . Smokeless  tobacco: Never Used  Substance and Sexual Activity  . Alcohol use: No    Frequency: Never    Comment: rarely  . Drug use: No  . Sexual activity: Yes    Birth control/protection: None  Lifestyle  . Physical activity:    Days per week: Not on file    Minutes per session: Not on file  . Stress: Not on file  Relationships  . Social connections:    Talks on phone: Not on file    Gets together: Not on file    Attends religious service: Not on file    Active member of club or organization: Not on file    Attends meetings of clubs or organizations: Not on file    Relationship status: Not on file  . Intimate partner violence:    Fear of current or ex partner: Not on file    Emotionally abused: Not on file    Physically abused: Not on file    Forced sexual activity: Not on file  Other Topics Concern  . Not on file  Social History Narrative  . Not on file    Review of Systems  Constitutional: Positive for appetite change.       Craving sweets Excessive thirst  HENT: Negative.   Eyes: Negative.   Respiratory: Negative.   Cardiovascular: Negative.   Gastrointestinal: Positive for constipation.  Endocrine: Negative.   Genitourinary: Positive for vaginal bleeding.  Musculoskeletal: Negative.   Skin: Negative.   Allergic/Immunologic: Negative.   Neurological: Positive for headaches.  Hematological: Negative.   Psychiatric/Behavioral: Negative.     PHYSICAL EXAMINATION:    BP 108/72 (BP Location: Right Arm, Patient Position: Sitting, Cuff Size: Normal)   Pulse 86   Wt 173 lb 12.8 oz (78.8 kg)   LMP 02/20/2018 (Exact Date)   BMI 31.79 kg/m     General appearance: alert, cooperative and appears stated age  Ultrasound images reviewed with the patient  ASSESSMENT AUB over the last few months. Normal TSH, normal pap, previous normal CBC Ultrasound with thin uniform endometrial stripe, signs of adenomyosis, other wise normal ultrasound    PLAN Will check UPT Check CBC,  Ferritin Trial of OCP's, no contraindications, risks reviewed F/U in 3 months If she continues to have AUB would do an endometrial biopsy   An After Visit Summary was printed and given to the patient.

## 2018-03-19 ENCOUNTER — Ambulatory Visit (INDEPENDENT_AMBULATORY_CARE_PROVIDER_SITE_OTHER): Payer: BLUE CROSS/BLUE SHIELD

## 2018-03-19 ENCOUNTER — Ambulatory Visit (INDEPENDENT_AMBULATORY_CARE_PROVIDER_SITE_OTHER): Payer: BLUE CROSS/BLUE SHIELD | Admitting: Obstetrics and Gynecology

## 2018-03-19 ENCOUNTER — Other Ambulatory Visit: Payer: Self-pay

## 2018-03-19 ENCOUNTER — Encounter: Payer: Self-pay | Admitting: Obstetrics and Gynecology

## 2018-03-19 VITALS — BP 108/72 | HR 86 | Wt 173.8 lb

## 2018-03-19 DIAGNOSIS — N939 Abnormal uterine and vaginal bleeding, unspecified: Secondary | ICD-10-CM | POA: Diagnosis not present

## 2018-03-19 LAB — POCT URINE PREGNANCY: Preg Test, Ur: NEGATIVE

## 2018-03-19 MED ORDER — NORETHIN ACE-ETH ESTRAD-FE 1-20 MG-MCG PO TABS: 1.0000 | ORAL_TABLET | Freq: Every day | ORAL | 0 refills | Status: DC

## 2018-03-19 NOTE — Patient Instructions (Signed)
Oral Contraception Information Oral contraceptive pills (OCPs) are medicines taken to prevent pregnancy. OCPs work by preventing the ovaries from releasing eggs. The hormones in OCPs also cause the cervical mucus to thicken, preventing the sperm from entering the uterus. The hormones also cause the uterine lining to become thin, not allowing a fertilized egg to attach to the inside of the uterus. OCPs are highly effective when taken exactly as prescribed. However, OCPs do not prevent sexually transmitted diseases (STDs). Safe sex practices, such as using condoms along with the pill, can help prevent STDs. Before taking the pill, you may have a physical exam and Pap test. Your health care provider may order blood tests. The health care provider will make sure you are a good candidate for oral contraception. Discuss with your health care provider the possible side effects of the OCP you may be prescribed. When starting an OCP, it can take 2 to 3 months for the body to adjust to the changes in hormone levels in your body. Types of oral contraception  The combination pill-This pill contains estrogen and progestin (synthetic progesterone) hormones. The combination pill comes in 21-day, 28-day, or 91-day packs. Some types of combination pills are meant to be taken continuously (365-day pills). With 21-day packs, you do not take pills for 7 days after the last pill. With 28-day packs, the pill is taken every day. The last 7 pills are without hormones. Certain types of pills have more than 21 hormone-containing pills. With 91-day packs, the first 84 pills contain both hormones, and the last 7 pills contain no hormones or contain estrogen only.  The minipill-This pill contains the progesterone hormone only. The pill is taken every day continuously. It is very important to take the pill at the same time each day. The minipill comes in packs of 28 pills. All 28 pills contain the hormone. Advantages of oral  contraceptive pills  Decreases premenstrual symptoms.  Treats menstrual period cramps.  Regulates the menstrual cycle.  Decreases a heavy menstrual flow.  May treatacne, depending on the type of pill.  Treats abnormal uterine bleeding.  Treats polycystic ovarian syndrome.  Treats endometriosis.  Can be used as emergency contraception. Things that can make oral contraceptive pills less effective OCPs can be less effective if:  You forget to take the pill at the same time every day.  You have a stomach or intestinal disease that lessens the absorption of the pill.  You take OCPs with other medicines that make OCPs less effective, such as antibiotics, certain HIV medicines, and some seizure medicines.  You take expired OCPs.  You forget to restart the pill on day 7, when using the packs of 21 pills.  Risks associated with oral contraceptive pills Oral contraceptive pills can sometimes cause side effects, such as:  Headache.  Nausea.  Breast tenderness.  Irregular bleeding or spotting.  Combination pills are also associated with a small increased risk of:  Blood clots.  Heart attack.  Stroke.  This information is not intended to replace advice given to you by your health care provider. Make sure you discuss any questions you have with your health care provider. Document Released: 08/26/2002 Document Revised: 11/11/2015 Document Reviewed: 11/24/2012 Elsevier Interactive Patient Education  2018 Elsevier Inc.  

## 2018-03-20 ENCOUNTER — Encounter: Payer: Self-pay | Admitting: Obstetrics and Gynecology

## 2018-03-20 ENCOUNTER — Telehealth: Payer: Self-pay | Admitting: Obstetrics and Gynecology

## 2018-03-20 LAB — CBC
HEMATOCRIT: 32.7 % — AB (ref 34.0–46.6)
HEMOGLOBIN: 10.6 g/dL — AB (ref 11.1–15.9)
MCH: 25.9 pg — ABNORMAL LOW (ref 26.6–33.0)
MCHC: 32.4 g/dL (ref 31.5–35.7)
MCV: 80 fL (ref 79–97)
Platelets: 399 10*3/uL (ref 150–450)
RBC: 4.1 x10E6/uL (ref 3.77–5.28)
RDW: 12.7 % (ref 12.3–15.4)
WBC: 10.8 10*3/uL (ref 3.4–10.8)

## 2018-03-20 LAB — FERRITIN: Ferritin: 7 ng/mL — ABNORMAL LOW (ref 15–150)

## 2018-03-20 NOTE — Telephone Encounter (Signed)
Patient sent the following correspondence through [facility]. Routing to triage to assist patient with request.  Can you please send in iron prescription.  Thanks

## 2018-03-20 NOTE — Telephone Encounter (Signed)
Call to patient. Advised patient could buy iron OTC. Advised could look for slow FE which would be gentler on the stomach as patient has history of constipation. Also advised to take with orange juice to help with absorption of iron. Patient verbalized understanding and appreciative of phone call.   Routing to provider and will close encounter.

## 2018-03-27 DIAGNOSIS — S329XXK Fracture of unspecified parts of lumbosacral spine and pelvis, subsequent encounter for fracture with nonunion: Secondary | ICD-10-CM | POA: Diagnosis not present

## 2018-03-27 DIAGNOSIS — Y33XXXD Other specified events, undetermined intent, subsequent encounter: Secondary | ICD-10-CM | POA: Diagnosis not present

## 2018-04-05 DIAGNOSIS — M25551 Pain in right hip: Secondary | ICD-10-CM | POA: Diagnosis not present

## 2018-04-05 DIAGNOSIS — M62551 Muscle wasting and atrophy, not elsewhere classified, right thigh: Secondary | ICD-10-CM | POA: Diagnosis not present

## 2018-04-05 DIAGNOSIS — M25651 Stiffness of right hip, not elsewhere classified: Secondary | ICD-10-CM | POA: Diagnosis not present

## 2018-04-05 DIAGNOSIS — M79651 Pain in right thigh: Secondary | ICD-10-CM | POA: Diagnosis not present

## 2018-04-22 ENCOUNTER — Other Ambulatory Visit (INDEPENDENT_AMBULATORY_CARE_PROVIDER_SITE_OTHER): Payer: Self-pay | Admitting: Orthopedic Surgery

## 2018-04-22 DIAGNOSIS — M5441 Lumbago with sciatica, right side: Secondary | ICD-10-CM

## 2018-04-22 NOTE — Telephone Encounter (Signed)
Please advise 

## 2018-04-22 NOTE — Telephone Encounter (Signed)
Ok to rf? 

## 2018-04-23 ENCOUNTER — Telehealth (INDEPENDENT_AMBULATORY_CARE_PROVIDER_SITE_OTHER): Payer: Self-pay | Admitting: Orthopedic Surgery

## 2018-04-23 NOTE — Telephone Encounter (Signed)
Patient called requesting a refill on Oxycodone.  YH#[phone]

## 2018-04-23 NOTE — Telephone Encounter (Signed)
I did not see this message until after I had the front desk call for an appt for this pt. She will be in the office Wednesday next week. Sorry I missed this I do not know if Dr. Marlou Sa wants to hold refill pending appt?

## 2018-04-23 NOTE — Telephone Encounter (Signed)
Can you please call pt and make appt to be seen by Dr. Marlou Sa? She has not been in the office since 01/2018 if requesting pain medication will need appt to eval the need.

## 2018-04-24 DIAGNOSIS — M955 Acquired deformity of pelvis: Secondary | ICD-10-CM | POA: Diagnosis not present

## 2018-04-24 DIAGNOSIS — S329XXA Fracture of unspecified parts of lumbosacral spine and pelvis, initial encounter for closed fracture: Secondary | ICD-10-CM | POA: Diagnosis not present

## 2018-04-24 DIAGNOSIS — X58XXXA Exposure to other specified factors, initial encounter: Secondary | ICD-10-CM | POA: Diagnosis not present

## 2018-04-24 MED ORDER — OXYCODONE HCL 5 MG PO CAPS: 5.0000 mg | ORAL_CAPSULE | Freq: Every evening | ORAL | 0 refills | Status: DC | PRN

## 2018-04-25 ENCOUNTER — Telehealth (INDEPENDENT_AMBULATORY_CARE_PROVIDER_SITE_OTHER): Payer: Self-pay | Admitting: Orthopedic Surgery

## 2018-04-25 NOTE — Telephone Encounter (Signed)
Dr Marlou Sa called and gave verbal. Madaline Brilliant to change from capsules to tablets

## 2018-04-25 NOTE — Telephone Encounter (Signed)
OXYCODONE written for patient. Written for capsules only have it in tablet form, Need Dr Marlou Sa to speak directly to Pharmacist

## 2018-05-01 ENCOUNTER — Ambulatory Visit (INDEPENDENT_AMBULATORY_CARE_PROVIDER_SITE_OTHER): Payer: BLUE CROSS/BLUE SHIELD | Admitting: Orthopedic Surgery

## 2018-05-01 ENCOUNTER — Encounter (INDEPENDENT_AMBULATORY_CARE_PROVIDER_SITE_OTHER): Payer: Self-pay | Admitting: Orthopedic Surgery

## 2018-05-01 ENCOUNTER — Telehealth (INDEPENDENT_AMBULATORY_CARE_PROVIDER_SITE_OTHER): Payer: Self-pay | Admitting: Orthopedic Surgery

## 2018-05-01 DIAGNOSIS — M25371 Other instability, right ankle: Secondary | ICD-10-CM | POA: Diagnosis not present

## 2018-05-01 MED ORDER — DICLOFENAC SODIUM 2 % TD SOLN: TRANSDERMAL | 1 refills | Status: DC

## 2018-05-01 NOTE — Telephone Encounter (Signed)
[facility] called in regards to the patient's Pennsaid 2% solution. They are needing some clarification on the prescription in regards to the number of pumps per day.   CB#[phone].  Thank you.

## 2018-05-01 NOTE — Telephone Encounter (Signed)
IC advised to change for 2 pumps bid prn pain

## 2018-05-04 ENCOUNTER — Encounter (INDEPENDENT_AMBULATORY_CARE_PROVIDER_SITE_OTHER): Payer: Self-pay | Admitting: Orthopedic Surgery

## 2018-05-04 NOTE — Progress Notes (Signed)
Office Visit Note   Patient: Traci Schmidt           Date of Birth: [birth year]           MRN: [id number] Visit Date: 05/01/2018 Requested by: Alycia Rossetti, MD [address] [place], [place] [address] PCP: Alycia Rossetti, MD  Subjective: Chief Complaint  Patient presents with  . Lower Back - Follow-up    HPI: Patient presents for follow-up of her back and hip.  She continues to need oxycodone for pain.  She states she is still doing about the same.  She has some surgery scheduled at [facility] to address her complex pelvic and back issues.  She also is having some left knee pain and right ankle pain.  She is had a history of some right ankle instability due primarily to weakness.  She currently is taking Lyrica oxycodone and Celebrex.              ROS: All systems reviewed are negative as they relate to the chief complaint within the history of present illness.  Patient denies  fevers or chills.   Assessment & Plan: Visit Diagnoses:  1. Instability of right ankle joint     Plan: Impression is back and hip symptoms which is moving a little bit more to the left side.  I think this is still related to her underlying pelvic instability and nerve compression in that lower lumbar region.  The right ankle is also having some pain likely related to instability issues.  She has a little laxity on that side as well.  The knee also has likely some degenerative patellofemoral type changes causing some of the symptoms.  We will try a sample box of pen said along with pen said prescription to see if that can help her ankle and knee.  We will see her back as needed.  I think her surgical odyssey will begin sometime early next year.  Follow-Up Instructions: Return if symptoms worsen or fail to improve.   Orders:  No orders of the defined types were placed in this encounter.  Meds ordered this encounter  Medications  . Diclofenac Sodium (PENNSAID) 2 % SOLN    Sig: 2 pumps to affected area  2-3 times daily prn    Dispense:  1 Bottle    Refill:  1      Procedures: No procedures performed   Clinical Data: No additional findings.  Objective: Vital Signs: There were no vitals taken for this visit.  Physical Exam:   Constitutional: Patient appears well-developed HEENT:  Head: Normocephalic Eyes:EOM are normal Neck: Normal range of motion Cardiovascular: Normal rate Pulmonary/chest: Effort normal Neurologic: Patient is alert Skin: Skin is warm Psychiatric: Patient has normal mood and affect    Ortho Exam: Ortho exam demonstrates mild lateral tenderness in the right ankle but good ankle dorsiflexion inversion and plantar flexion strength.  Eversion strength a little bit less on the right compared to the left.  Knee exam demonstrates no effusion on the left but with good range of motion mild patellofemoral crepitus.  Some medial retinacular tenderness is present on that left knee.  Specialty Comments:  No specialty comments available.  Imaging: No results found.   PMFS History: Patient Active Problem List   Diagnosis Date Noted  . Pain in right ankle and joints of right foot 01/21/2018  . Low vitamin D level 01/01/2018  . Obesity (BMI 30-39.9) 06/23/2017   Past Medical History:  Diagnosis  Date  . Abnormal uterine bleeding   . Allergy    seasonal  . Fibroid   . Gallstones     Family History  Problem Relation Age of Onset  . Cancer Mother        breast  . Diabetes Mother   . Hypertension Mother   . Breast cancer Mother   . Hyperlipidemia Father   . Diabetes Father   . Diabetes Brother   . Hyperlipidemia Brother   . Hyperlipidemia Brother   . Colon cancer Neg Hx     Past Surgical History:  Procedure Laterality Date  . BALLOON DILATION N/A 11/12/2013   Procedure: BALLOON DILATION WITH STONE EXTRACTION;  Surgeon: Daneil Dolin, MD;  Location: [facility];  Service: Endoscopy;  Laterality: N/A;  . CESAREAN SECTION    . CHOLECYSTECTOMY N/A 11/05/2013    Dr. Arnoldo Morale  . ERCP N/A 11/12/2013   Procedure: ENDOSCOPIC RETROGRADE CHOLANGIOPANCREATOGRAPHY (ERCP), SPHINCTEROTOMY, BALLOON DILATION WITH STONE EXTRACTION;  Surgeon: Daneil Dolin, MD;  Location: [facility];  Service: Endoscopy;  Laterality: N/A;  . PELVIC FRACTURE SURGERY    . SPHINCTEROTOMY N/A 11/12/2013   Procedure: SPHINCTEROTOMY;  Surgeon: Daneil Dolin, MD;  Location: [facility];  Service: Endoscopy;  Laterality: N/A;   Social History   Occupational History  . Not on file  Tobacco Use  . Smoking status: Never Smoker  . Smokeless tobacco: Never Used  Substance and Sexual Activity  . Alcohol use: No    Frequency: Never    Comment: rarely  . Drug use: No  . Sexual activity: Yes    Birth control/protection: None

## 2018-05-08 ENCOUNTER — Encounter: Payer: Self-pay | Admitting: Family Medicine

## 2018-05-08 ENCOUNTER — Ambulatory Visit: Payer: BLUE CROSS/BLUE SHIELD | Admitting: Family Medicine

## 2018-05-08 ENCOUNTER — Other Ambulatory Visit: Payer: Self-pay

## 2018-05-08 VITALS — BP 130/74 | HR 104 | Temp 98.6°F | Resp 16 | Ht 62.0 in | Wt 169.0 lb

## 2018-05-08 DIAGNOSIS — J069 Acute upper respiratory infection, unspecified: Secondary | ICD-10-CM | POA: Diagnosis not present

## 2018-05-08 DIAGNOSIS — J040 Acute laryngitis: Secondary | ICD-10-CM

## 2018-05-08 MED ORDER — PREDNISONE 20 MG PO TABS: 20.0000 mg | ORAL_TABLET | Freq: Every day | ORAL | 0 refills | Status: DC

## 2018-05-08 MED ORDER — HYDROCOD POLST-CPM POLST ER 10-8 MG/5ML PO SUER: 5.0000 mL | Freq: Two times a day (BID) | ORAL | 0 refills | Status: DC | PRN

## 2018-05-08 NOTE — Progress Notes (Signed)
   Subjective:    Patient ID: Traci Schmidt, female    DOB: 04-20-1981, 37 y.o.   MRN: [id number]  Patient presents for Illness (x2 days- laryingitis, productive cough, nasal drainage)  Cough with nasal drainage, congestion for the past 2 days, laryngitis Took dayquil and theraflu.   No vomiting or diarrhea Other family members have been sick with same symptoms, her toddler first No SOB, no wheezing    Review Of Systems:  GEN- denies fatigue, fever, weight loss,weakness, recent illness HEENT- denies eye drainage, change in vision, +nasal discharge, CVS- denies chest pain, palpitations RESP- denies SOB, +cough, wheeze ABD- denies N/V, change in stools, abd pain GU- denies dysuria, hematuria, dribbling, incontinence Neuro- denies headache, dizziness, syncope, seizure activity       Objective:    BP 130/74   Pulse (!) 104   Temp 98.6 F (37 C) (Oral)   Resp 16   Ht 5\' 2"  (1.575 m)   Wt 169 lb (76.7 kg)   SpO2 98%   BMI 30.91 kg/m  GEN- NAD, alert and oriented x3, hoarse  HEENT- PERRL, EOMI, non injected sclera, pink conjunctiva, MMM, oropharynx mild injection, TM clear bilat no effusion,  No  maxillary sinus tenderness, inflammed turbinates,  Nasal drainage  Neck- Supple, no LAD CVS- RRR HR 90 , no murmur RESP-CTAB EXT- No edema Pulses- Radial 2+        Assessment & Plan:    She also has some bleeding after she had a cystoscopy done in preparation for her upcoming pelvic surgery.  She thought that maybe this is related to her previous dysfunctional uterine bleeding.  She still on the birth control which has been working for her.  Advised that this likely is due to the cystoscopy continue the birth control at the current dosing and see if it resolves on its own.  She is not having any significant abdominal pain.  Vaginal discharge Problem List Items Addressed This Visit    None    Visit Diagnoses    Viral URI    -  Primary   Supportive care, tussionex given for  cough- she is not to take with oxycodone, nasal saline/steroid, prednisone to help laryngitis   Laryngitis          Note: This dictation was prepared with Dragon dictation along with smaller phrase technology. Any transcriptional errors that result from this process are unintentional.

## 2018-05-08 NOTE — Patient Instructions (Signed)
Use nasal saline or flonase Prednisone given Take cough medicine, do not take with the oxycodone F/U as needed

## 2018-05-13 ENCOUNTER — Other Ambulatory Visit: Payer: Self-pay | Admitting: Family Medicine

## 2018-05-13 MED ORDER — AMOXICILLIN 875 MG PO TABS: 875.0000 mg | ORAL_TABLET | Freq: Two times a day (BID) | ORAL | 0 refills | Status: DC

## 2018-05-13 NOTE — Progress Notes (Signed)
Pt here in office with her son Sinus drainage worse, still with laryngitis Completed prednisone Has tussionex  Will send amox  875mg  BID x 10 days, to cover sinusitis

## 2018-05-25 ENCOUNTER — Other Ambulatory Visit (INDEPENDENT_AMBULATORY_CARE_PROVIDER_SITE_OTHER): Payer: Self-pay | Admitting: Orthopedic Surgery

## 2018-05-25 DIAGNOSIS — M5441 Lumbago with sciatica, right side: Secondary | ICD-10-CM

## 2018-05-27 MED ORDER — OXYCODONE HCL 5 MG PO CAPS: 5.0000 mg | ORAL_CAPSULE | Freq: Every evening | ORAL | 0 refills | Status: DC | PRN

## 2018-05-27 NOTE — Telephone Encounter (Signed)
[place] for refill? Last filled 04/24/18 #35.

## 2018-05-27 NOTE — Telephone Encounter (Signed)
Y however when she starts getting surgery at [facility] she may need to have those guys start refilling things.  Thanks

## 2018-05-28 NOTE — Telephone Encounter (Signed)
I left voicemail advising. Also advised script ready at front desk for pick up.

## 2018-05-29 ENCOUNTER — Other Ambulatory Visit (INDEPENDENT_AMBULATORY_CARE_PROVIDER_SITE_OTHER): Payer: Self-pay | Admitting: Physician Assistant

## 2018-05-29 DIAGNOSIS — M79651 Pain in right thigh: Secondary | ICD-10-CM | POA: Diagnosis not present

## 2018-05-29 DIAGNOSIS — M25551 Pain in right hip: Secondary | ICD-10-CM | POA: Diagnosis not present

## 2018-05-29 DIAGNOSIS — M79661 Pain in right lower leg: Secondary | ICD-10-CM | POA: Diagnosis not present

## 2018-05-29 DIAGNOSIS — M545 Low back pain: Secondary | ICD-10-CM | POA: Diagnosis not present

## 2018-05-29 DIAGNOSIS — M533 Sacrococcygeal disorders, not elsewhere classified: Secondary | ICD-10-CM | POA: Diagnosis not present

## 2018-05-29 DIAGNOSIS — G8929 Other chronic pain: Secondary | ICD-10-CM | POA: Diagnosis not present

## 2018-05-29 MED ORDER — OXYCODONE-ACETAMINOPHEN 5-325 MG PO TABS: 1.0000 | ORAL_TABLET | Freq: Two times a day (BID) | ORAL | 0 refills | Status: DC | PRN

## 2018-05-29 NOTE — Progress Notes (Signed)
Patient's husband here to pick up prescription for oxyIR capsules and requested change to a tablet form as the pharmacy never has the capsules.  Wrote for Percocet 5/325 mg tablets instead and previous prescription from Dr. Marlou Sa was voided.

## 2018-06-03 ENCOUNTER — Telehealth: Payer: Self-pay | Admitting: *Deleted

## 2018-06-03 NOTE — Telephone Encounter (Signed)
Call placed to patient and patient made aware.   Medication pended for approval.

## 2018-06-03 NOTE — Telephone Encounter (Signed)
Okay to refill lyrica

## 2018-06-03 NOTE — Telephone Encounter (Signed)
Received call from patient.   Reports that she was prescribed Lyrica by Dr. Gerome Apley at [facility], but he has since left the practice. States that she has been trying to get it refilled from fellow MD's in office, but it has yet to be refilled.   Requested PCP to refill.   MD please advise.

## 2018-06-04 MED ORDER — PREGABALIN 75 MG PO CAPS: 75.0000 mg | ORAL_CAPSULE | Freq: Two times a day (BID) | ORAL | 5 refills | Status: DC

## 2018-06-05 DIAGNOSIS — S329XXK Fracture of unspecified parts of lumbosacral spine and pelvis, subsequent encounter for fracture with nonunion: Secondary | ICD-10-CM | POA: Diagnosis not present

## 2018-06-05 DIAGNOSIS — M955 Acquired deformity of pelvis: Secondary | ICD-10-CM | POA: Diagnosis not present

## 2018-06-06 ENCOUNTER — Ambulatory Visit: Payer: BLUE CROSS/BLUE SHIELD | Admitting: Obstetrics and Gynecology

## 2018-06-06 ENCOUNTER — Other Ambulatory Visit: Payer: Self-pay

## 2018-06-06 ENCOUNTER — Encounter: Payer: Self-pay | Admitting: Obstetrics and Gynecology

## 2018-06-06 VITALS — BP 126/82 | HR 72 | Wt 167.8 lb

## 2018-06-06 DIAGNOSIS — Z3041 Encounter for surveillance of contraceptive pills: Secondary | ICD-10-CM

## 2018-06-06 DIAGNOSIS — N939 Abnormal uterine and vaginal bleeding, unspecified: Secondary | ICD-10-CM

## 2018-06-06 DIAGNOSIS — Z862 Personal history of diseases of the blood and blood-forming organs and certain disorders involving the immune mechanism: Secondary | ICD-10-CM

## 2018-06-06 MED ORDER — NORETHIN ACE-ETH ESTRAD-FE 1-20 MG-MCG PO TABS: 1.0000 | ORAL_TABLET | Freq: Every day | ORAL | 2 refills | Status: DC
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
## 2018-06-07 DIAGNOSIS — S329XXK Fracture of unspecified parts of lumbosacral spine and pelvis, subsequent encounter for fracture with nonunion: Secondary | ICD-10-CM | POA: Diagnosis not present

## 2018-06-07 DIAGNOSIS — R9431 Abnormal electrocardiogram [ECG] [EKG]: Secondary | ICD-10-CM | POA: Diagnosis not present

## 2018-06-07 DIAGNOSIS — M5417 Radiculopathy, lumbosacral region: Secondary | ICD-10-CM | POA: Diagnosis not present

## 2018-06-07 DIAGNOSIS — M955 Acquired deformity of pelvis: Secondary | ICD-10-CM | POA: Diagnosis not present

## 2018-06-07 DIAGNOSIS — I498 Other specified cardiac arrhythmias: Secondary | ICD-10-CM | POA: Diagnosis not present

## 2018-06-07 DIAGNOSIS — X58XXXD Exposure to other specified factors, subsequent encounter: Secondary | ICD-10-CM | POA: Diagnosis not present

## 2018-06-07 LAB — CBC
HEMATOCRIT: 37.3 % (ref 34.0–46.6)
HEMOGLOBIN: 11.6 g/dL (ref 11.1–15.9)
MCH: 25.5 pg — AB (ref 26.6–33.0)
MCHC: 31.1 g/dL — ABNORMAL LOW (ref 31.5–35.7)
MCV: 82 fL (ref 79–97)
PLATELETS: 426 10*3/uL (ref 150–450)
RBC: 4.55 x10E6/uL (ref 3.77–5.28)
RDW: 15.8 % — ABNORMAL HIGH (ref 12.3–15.4)
WBC: 10.4 10*3/uL (ref 3.4–10.8)

## 2018-06-07 LAB — FERRITIN: Ferritin: 7 ng/mL — ABNORMAL LOW (ref 15–150)

## 2018-06-11 DIAGNOSIS — S3210XK Unspecified fracture of sacrum, subsequent encounter for fracture with nonunion: Secondary | ICD-10-CM | POA: Diagnosis not present

## 2018-06-11 DIAGNOSIS — Z6834 Body mass index (BMI) 34.0-34.9, adult: Secondary | ICD-10-CM | POA: Diagnosis not present

## 2018-06-11 DIAGNOSIS — S329XXK Fracture of unspecified parts of lumbosacral spine and pelvis, subsequent encounter for fracture with nonunion: Secondary | ICD-10-CM | POA: Diagnosis not present

## 2018-06-11 DIAGNOSIS — M5126 Other intervertebral disc displacement, lumbar region: Secondary | ICD-10-CM | POA: Diagnosis not present

## 2018-06-11 DIAGNOSIS — R143 Flatulence: Secondary | ICD-10-CM | POA: Diagnosis not present

## 2018-06-11 DIAGNOSIS — N920 Excessive and frequent menstruation with regular cycle: Secondary | ICD-10-CM | POA: Diagnosis not present

## 2018-06-11 DIAGNOSIS — D62 Acute posthemorrhagic anemia: Secondary | ICD-10-CM | POA: Diagnosis not present

## 2018-06-11 DIAGNOSIS — E669 Obesity, unspecified: Secondary | ICD-10-CM | POA: Diagnosis not present

## 2018-06-11 DIAGNOSIS — Y33XXXA Other specified events, undetermined intent, initial encounter: Secondary | ICD-10-CM | POA: Diagnosis not present

## 2018-06-11 DIAGNOSIS — M955 Acquired deformity of pelvis: Secondary | ICD-10-CM | POA: Diagnosis not present

## 2018-06-11 DIAGNOSIS — Z4682 Encounter for fitting and adjustment of non-vascular catheter: Secondary | ICD-10-CM | POA: Diagnosis not present

## 2018-06-11 DIAGNOSIS — G8918 Other acute postprocedural pain: Secondary | ICD-10-CM | POA: Diagnosis not present

## 2018-06-11 DIAGNOSIS — N736 Female pelvic peritoneal adhesions (postinfective): Secondary | ICD-10-CM | POA: Diagnosis not present

## 2018-06-11 DIAGNOSIS — S3210XA Unspecified fracture of sacrum, initial encounter for closed fracture: Secondary | ICD-10-CM | POA: Diagnosis not present

## 2018-06-11 DIAGNOSIS — M160 Bilateral primary osteoarthritis of hip: Secondary | ICD-10-CM | POA: Diagnosis not present

## 2018-06-11 DIAGNOSIS — S3282XK Multiple fractures of pelvis without disruption of pelvic ring, subsequent encounter for fracture with nonunion: Secondary | ICD-10-CM | POA: Diagnosis not present

## 2018-06-11 DIAGNOSIS — M5417 Radiculopathy, lumbosacral region: Secondary | ICD-10-CM | POA: Diagnosis not present

## 2018-06-11 DIAGNOSIS — Z4789 Encounter for other orthopedic aftercare: Secondary | ICD-10-CM | POA: Diagnosis not present

## 2018-06-11 DIAGNOSIS — D684 Acquired coagulation factor deficiency: Secondary | ICD-10-CM | POA: Diagnosis not present

## 2018-06-11 DIAGNOSIS — J9601 Acute respiratory failure with hypoxia: Secondary | ICD-10-CM | POA: Diagnosis not present

## 2018-06-11 DIAGNOSIS — S3219XA Other fracture of sacrum, initial encounter for closed fracture: Secondary | ICD-10-CM | POA: Diagnosis not present

## 2018-06-11 DIAGNOSIS — K660 Peritoneal adhesions (postprocedural) (postinfection): Secondary | ICD-10-CM | POA: Diagnosis not present

## 2018-06-11 DIAGNOSIS — S32810K Multiple fractures of pelvis with stable disruption of pelvic ring, subsequent encounter for fracture with nonunion: Secondary | ICD-10-CM | POA: Diagnosis not present

## 2018-06-11 DIAGNOSIS — K219 Gastro-esophageal reflux disease without esophagitis: Secondary | ICD-10-CM | POA: Diagnosis not present

## 2018-06-13 DIAGNOSIS — Z4682 Encounter for fitting and adjustment of non-vascular catheter: Secondary | ICD-10-CM | POA: Diagnosis not present

## 2018-06-13 DIAGNOSIS — G8918 Other acute postprocedural pain: Secondary | ICD-10-CM | POA: Diagnosis not present

## 2018-06-13 DIAGNOSIS — S329XXK Fracture of unspecified parts of lumbosacral spine and pelvis, subsequent encounter for fracture with nonunion: Secondary | ICD-10-CM | POA: Diagnosis not present

## 2018-06-13 DIAGNOSIS — D684 Acquired coagulation factor deficiency: Secondary | ICD-10-CM | POA: Diagnosis not present

## 2018-06-13 DIAGNOSIS — J9601 Acute respiratory failure with hypoxia: Secondary | ICD-10-CM | POA: Diagnosis not present

## 2018-06-13 DIAGNOSIS — D62 Acute posthemorrhagic anemia: Secondary | ICD-10-CM | POA: Diagnosis not present

## 2018-06-13 DIAGNOSIS — S3210XA Unspecified fracture of sacrum, initial encounter for closed fracture: Secondary | ICD-10-CM | POA: Diagnosis not present

## 2018-06-13 DIAGNOSIS — K660 Peritoneal adhesions (postprocedural) (postinfection): Secondary | ICD-10-CM | POA: Diagnosis not present

## 2018-06-13 DIAGNOSIS — S32810K Multiple fractures of pelvis with stable disruption of pelvic ring, subsequent encounter for fracture with nonunion: Secondary | ICD-10-CM | POA: Diagnosis not present

## 2018-06-13 DIAGNOSIS — S3210XK Unspecified fracture of sacrum, subsequent encounter for fracture with nonunion: Secondary | ICD-10-CM | POA: Diagnosis not present

## 2018-06-14 DIAGNOSIS — M5417 Radiculopathy, lumbosacral region: Secondary | ICD-10-CM | POA: Diagnosis not present

## 2018-06-14 DIAGNOSIS — M955 Acquired deformity of pelvis: Secondary | ICD-10-CM | POA: Diagnosis not present

## 2018-06-14 DIAGNOSIS — D62 Acute posthemorrhagic anemia: Secondary | ICD-10-CM | POA: Diagnosis not present

## 2018-06-14 DIAGNOSIS — G8918 Other acute postprocedural pain: Secondary | ICD-10-CM | POA: Diagnosis not present

## 2018-06-15 DIAGNOSIS — G8918 Other acute postprocedural pain: Secondary | ICD-10-CM | POA: Diagnosis not present

## 2018-06-15 DIAGNOSIS — M5417 Radiculopathy, lumbosacral region: Secondary | ICD-10-CM | POA: Diagnosis not present

## 2018-06-15 DIAGNOSIS — D62 Acute posthemorrhagic anemia: Secondary | ICD-10-CM | POA: Diagnosis not present

## 2018-06-15 DIAGNOSIS — M955 Acquired deformity of pelvis: Secondary | ICD-10-CM | POA: Diagnosis not present

## 2018-06-16 DIAGNOSIS — G8918 Other acute postprocedural pain: Secondary | ICD-10-CM | POA: Diagnosis not present

## 2018-06-16 DIAGNOSIS — R143 Flatulence: Secondary | ICD-10-CM | POA: Diagnosis not present

## 2018-06-17 DIAGNOSIS — M5417 Radiculopathy, lumbosacral region: Secondary | ICD-10-CM | POA: Diagnosis not present

## 2018-06-17 DIAGNOSIS — G8918 Other acute postprocedural pain: Secondary | ICD-10-CM | POA: Diagnosis not present

## 2018-06-17 DIAGNOSIS — D62 Acute posthemorrhagic anemia: Secondary | ICD-10-CM | POA: Diagnosis not present

## 2018-06-17 DIAGNOSIS — M955 Acquired deformity of pelvis: Secondary | ICD-10-CM | POA: Diagnosis not present

## 2018-06-18 DIAGNOSIS — G8918 Other acute postprocedural pain: Secondary | ICD-10-CM | POA: Diagnosis not present

## 2018-06-19 DIAGNOSIS — G8918 Other acute postprocedural pain: Secondary | ICD-10-CM | POA: Diagnosis not present

## 2018-06-20 ENCOUNTER — Ambulatory Visit: Payer: BLUE CROSS/BLUE SHIELD | Admitting: Obstetrics and Gynecology

## 2018-06-21 DIAGNOSIS — S329XXK Fracture of unspecified parts of lumbosacral spine and pelvis, subsequent encounter for fracture with nonunion: Secondary | ICD-10-CM | POA: Diagnosis not present

## 2018-06-21 DIAGNOSIS — G8918 Other acute postprocedural pain: Secondary | ICD-10-CM | POA: Diagnosis not present

## 2018-06-22 DIAGNOSIS — M160 Bilateral primary osteoarthritis of hip: Secondary | ICD-10-CM | POA: Diagnosis not present

## 2018-06-22 DIAGNOSIS — Z4789 Encounter for other orthopedic aftercare: Secondary | ICD-10-CM | POA: Diagnosis not present

## 2018-06-25 DIAGNOSIS — D62 Acute posthemorrhagic anemia: Secondary | ICD-10-CM | POA: Insufficient documentation

## 2018-06-26 DIAGNOSIS — Z981 Arthrodesis status: Secondary | ICD-10-CM | POA: Diagnosis not present

## 2018-06-26 DIAGNOSIS — Z9181 History of falling: Secondary | ICD-10-CM | POA: Diagnosis not present

## 2018-06-26 DIAGNOSIS — Z7901 Long term (current) use of anticoagulants: Secondary | ICD-10-CM | POA: Diagnosis not present

## 2018-06-26 DIAGNOSIS — S3289XK Fracture of other parts of pelvis, subsequent encounter for fracture with nonunion: Secondary | ICD-10-CM | POA: Diagnosis not present

## 2018-06-26 DIAGNOSIS — Z793 Long term (current) use of hormonal contraceptives: Secondary | ICD-10-CM | POA: Diagnosis not present

## 2018-06-27 ENCOUNTER — Telehealth: Payer: Self-pay | Admitting: *Deleted

## 2018-06-27 NOTE — Telephone Encounter (Signed)
Received call from [place], [facility] OT with [facility].   Reports that orders were received for [facility] OT S/P pelvic surgery, but patient has declined services.

## 2018-06-28 DIAGNOSIS — Z793 Long term (current) use of hormonal contraceptives: Secondary | ICD-10-CM | POA: Diagnosis not present

## 2018-06-28 DIAGNOSIS — Z7901 Long term (current) use of anticoagulants: Secondary | ICD-10-CM | POA: Diagnosis not present

## 2018-06-28 DIAGNOSIS — Z981 Arthrodesis status: Secondary | ICD-10-CM | POA: Diagnosis not present

## 2018-06-28 DIAGNOSIS — Z9181 History of falling: Secondary | ICD-10-CM | POA: Diagnosis not present

## 2018-06-28 DIAGNOSIS — S3289XK Fracture of other parts of pelvis, subsequent encounter for fracture with nonunion: Secondary | ICD-10-CM | POA: Diagnosis not present

## 2018-06-28 NOTE — Telephone Encounter (Signed)
noted 

## 2018-07-01 DIAGNOSIS — Z981 Arthrodesis status: Secondary | ICD-10-CM | POA: Diagnosis not present

## 2018-07-01 DIAGNOSIS — Z7901 Long term (current) use of anticoagulants: Secondary | ICD-10-CM | POA: Diagnosis not present

## 2018-07-01 DIAGNOSIS — Z9181 History of falling: Secondary | ICD-10-CM | POA: Diagnosis not present

## 2018-07-01 DIAGNOSIS — S3289XK Fracture of other parts of pelvis, subsequent encounter for fracture with nonunion: Secondary | ICD-10-CM | POA: Diagnosis not present

## 2018-07-01 DIAGNOSIS — Z793 Long term (current) use of hormonal contraceptives: Secondary | ICD-10-CM | POA: Diagnosis not present

## 2018-07-02 ENCOUNTER — Telehealth: Payer: Self-pay | Admitting: *Deleted

## 2018-07-02 NOTE — Telephone Encounter (Signed)
Orders need to come from the surgeon who operated on her, not me

## 2018-07-02 NOTE — Telephone Encounter (Signed)
Received call from [place], [facility] PT with [facility].   Reports that patient has been admitted under [facility] services as of 06/26/2018.   Requested VO for [facility] PT x2 weekly x2 week, then 1x weekly x3 weeks for strengthening, balance, and gait S/P pelvic surgery. VO given.   Also inquired as to if [facility] SN is to remove staples. Advised to contact surgeon for further orders.

## 2018-07-03 DIAGNOSIS — Z7901 Long term (current) use of anticoagulants: Secondary | ICD-10-CM | POA: Diagnosis not present

## 2018-07-03 DIAGNOSIS — Z793 Long term (current) use of hormonal contraceptives: Secondary | ICD-10-CM | POA: Diagnosis not present

## 2018-07-03 DIAGNOSIS — Z9181 History of falling: Secondary | ICD-10-CM | POA: Diagnosis not present

## 2018-07-03 DIAGNOSIS — S3289XK Fracture of other parts of pelvis, subsequent encounter for fracture with nonunion: Secondary | ICD-10-CM | POA: Diagnosis not present

## 2018-07-03 DIAGNOSIS — Z981 Arthrodesis status: Secondary | ICD-10-CM | POA: Diagnosis not present

## 2018-07-04 NOTE — Telephone Encounter (Signed)
Call placed to [facility].   Message left for [place], [place]

## 2018-07-05 NOTE — Telephone Encounter (Signed)
Received VM from [place]. States that patient is non-weightbearing and is requiring order for wheelchair.   Call placed to [facility], PT with [facility] [phone]~ telephone.   Advised that all orders will need to come from surgeon as we have not seen patient and do not have documentation supporting any new orders.

## 2018-07-08 DIAGNOSIS — Z9181 History of falling: Secondary | ICD-10-CM | POA: Diagnosis not present

## 2018-07-08 DIAGNOSIS — S3289XK Fracture of other parts of pelvis, subsequent encounter for fracture with nonunion: Secondary | ICD-10-CM | POA: Diagnosis not present

## 2018-07-08 DIAGNOSIS — Z981 Arthrodesis status: Secondary | ICD-10-CM | POA: Diagnosis not present

## 2018-07-08 DIAGNOSIS — Z793 Long term (current) use of hormonal contraceptives: Secondary | ICD-10-CM | POA: Diagnosis not present

## 2018-07-08 DIAGNOSIS — Z7901 Long term (current) use of anticoagulants: Secondary | ICD-10-CM | POA: Diagnosis not present

## 2018-07-15 DIAGNOSIS — Z7901 Long term (current) use of anticoagulants: Secondary | ICD-10-CM | POA: Diagnosis not present

## 2018-07-15 DIAGNOSIS — S3289XK Fracture of other parts of pelvis, subsequent encounter for fracture with nonunion: Secondary | ICD-10-CM | POA: Diagnosis not present

## 2018-07-15 DIAGNOSIS — Z981 Arthrodesis status: Secondary | ICD-10-CM | POA: Diagnosis not present

## 2018-07-15 DIAGNOSIS — Z9181 History of falling: Secondary | ICD-10-CM | POA: Diagnosis not present

## 2018-07-15 DIAGNOSIS — Z793 Long term (current) use of hormonal contraceptives: Secondary | ICD-10-CM | POA: Diagnosis not present

## 2018-07-16 DIAGNOSIS — R609 Edema, unspecified: Secondary | ICD-10-CM | POA: Diagnosis not present

## 2018-07-16 DIAGNOSIS — M5417 Radiculopathy, lumbosacral region: Secondary | ICD-10-CM | POA: Diagnosis not present

## 2018-07-16 DIAGNOSIS — M25571 Pain in right ankle and joints of right foot: Secondary | ICD-10-CM | POA: Diagnosis not present

## 2018-07-16 DIAGNOSIS — M7989 Other specified soft tissue disorders: Secondary | ICD-10-CM | POA: Diagnosis not present

## 2018-07-16 DIAGNOSIS — X58XXXD Exposure to other specified factors, subsequent encounter: Secondary | ICD-10-CM | POA: Diagnosis not present

## 2018-07-16 DIAGNOSIS — S329XXK Fracture of unspecified parts of lumbosacral spine and pelvis, subsequent encounter for fracture with nonunion: Secondary | ICD-10-CM | POA: Diagnosis not present

## 2018-07-16 DIAGNOSIS — M955 Acquired deformity of pelvis: Secondary | ICD-10-CM | POA: Diagnosis not present

## 2018-07-18 DIAGNOSIS — M4326 Fusion of spine, lumbar region: Secondary | ICD-10-CM | POA: Insufficient documentation

## 2018-07-22 DIAGNOSIS — Z9181 History of falling: Secondary | ICD-10-CM | POA: Diagnosis not present

## 2018-07-22 DIAGNOSIS — Z7901 Long term (current) use of anticoagulants: Secondary | ICD-10-CM | POA: Diagnosis not present

## 2018-07-22 DIAGNOSIS — Z981 Arthrodesis status: Secondary | ICD-10-CM | POA: Diagnosis not present

## 2018-07-22 DIAGNOSIS — Z793 Long term (current) use of hormonal contraceptives: Secondary | ICD-10-CM | POA: Diagnosis not present

## 2018-07-22 DIAGNOSIS — S3289XK Fracture of other parts of pelvis, subsequent encounter for fracture with nonunion: Secondary | ICD-10-CM | POA: Diagnosis not present

## 2018-07-23 ENCOUNTER — Other Ambulatory Visit: Payer: Self-pay

## 2018-07-23 ENCOUNTER — Ambulatory Visit: Payer: BLUE CROSS/BLUE SHIELD | Admitting: Family Medicine

## 2018-07-23 ENCOUNTER — Telehealth: Payer: Self-pay | Admitting: Obstetrics and Gynecology

## 2018-07-23 ENCOUNTER — Encounter: Payer: Self-pay | Admitting: Family Medicine

## 2018-07-23 VITALS — BP 120/68 | HR 82 | Temp 98.1°F | Resp 16 | Ht 62.0 in

## 2018-07-23 DIAGNOSIS — G4730 Sleep apnea, unspecified: Secondary | ICD-10-CM | POA: Diagnosis not present

## 2018-07-23 DIAGNOSIS — N938 Other specified abnormal uterine and vaginal bleeding: Secondary | ICD-10-CM | POA: Diagnosis not present

## 2018-07-23 DIAGNOSIS — K5903 Drug induced constipation: Secondary | ICD-10-CM

## 2018-07-23 MED ORDER — MEDROXYPROGESTERONE ACETATE 10 MG PO TABS: ORAL_TABLET | ORAL | 1 refills | Status: DC

## 2018-07-23 MED ORDER — LINACLOTIDE 145 MCG PO CAPS: 145.0000 ug | ORAL_CAPSULE | Freq: Every day | ORAL | 3 refills | Status: DC
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
## 2018-07-23 NOTE — Patient Instructions (Addendum)
F/u 4 months  We will call about the birth control/hormones  Referral for sleep study

## 2018-07-23 NOTE — Telephone Encounter (Signed)
Call from the patient's primary MD, Dr Buelah Manis, the patient is one month post op s/p major surgery on her pelvis. She is walking with a walker. She went off OCP's because of her surgery (14 hour case), and has now started having heavy bleeding (it was an issue prior to being on OCP's). She was restarted on loestrin but was having nausea. She is not on blood thinners. With her limited mobility, I would be worried about a blood clot on OCP's. Recommend her primary start her on provera 10 mg BID, patient should call me later this week if her bleeding hasn't significantly slowed down.

## 2018-07-23 NOTE — Assessment & Plan Note (Signed)
I discussed with her GYN, to prevent further vaginal bleeding and cramping, will place on provera 10mg  BID, until bleeding stops, then take 10mg  daily If bleeding does not stop by Monday, she will call her GYN directly

## 2018-07-25 ENCOUNTER — Telehealth: Payer: Self-pay | Admitting: *Deleted

## 2018-07-25 NOTE — Telephone Encounter (Signed)
Your information has been submitted to [employer] [place]. [employer] [place] will review the request and fax you a determination directly, typically within 3 business days of your submission once all necessary information is received.  If [employer] [place] has not responded in 3 business days or if you have any questions about your submission, contact [employer] [place] at [phone]. 

## 2018-07-25 NOTE — Telephone Encounter (Signed)
Received request from pharmacy for [place] on [address].  PA submitted.   Dx: K59.03.

## 2018-07-25 NOTE — Telephone Encounter (Signed)
-----   Message from Alycia Rossetti, MD sent at 07/23/2018  4:20 PM EST ----- Regarding: Linzess likely needs PA Failed senna, miralax, fiber

## 2018-07-25 NOTE — Telephone Encounter (Signed)
Spoke with patient. Patient states that she started bleeding on 07/23/2018 and was seen by her PCP. Was started on Provera 10 mg BID as recommended by Dr.Jertson. Patient started taking medication on 07/23/2018. Prior to starting Provera she was changing her pad 2-3 times a day. Now she is changing her pad once a day. Advised will review with Dr.Jertson and return call.

## 2018-07-26 NOTE — Telephone Encounter (Signed)
She should stay on the provera 10 mg BID, hopefully her bleeding is continuing to get lighter. If she is still bleeding, see if she can come in next week so we can figure out a longer term plan for her.

## 2018-07-26 NOTE — Telephone Encounter (Signed)
Call to patient. Message given to patient as seen below from Dr. Talbert Nan and she verbalized understanding. Patient states that she is still having some spotting today. OV offered. Patient scheduled for Monday 07-29-2018 at 1300. Patient agreeable to date and time of appointment.   Routing to provider and will close encounter.

## 2018-07-29 ENCOUNTER — Telehealth: Payer: Self-pay | Admitting: Obstetrics and Gynecology

## 2018-07-29 ENCOUNTER — Ambulatory Visit: Payer: BLUE CROSS/BLUE SHIELD | Admitting: Obstetrics and Gynecology

## 2018-07-29 MED ORDER — LINACLOTIDE 145 MCG PO CAPS: 145.0000 ug | ORAL_CAPSULE | Freq: Every day | ORAL | 3 refills | Status: DC

## 2018-07-29 NOTE — Telephone Encounter (Signed)
Received PA determination.   PA approved  07/25/2018- 07/24/2019.  Pharmacy made aware.

## 2018-07-29 NOTE — Telephone Encounter (Signed)
Patient called and cancelled her appointment for follow up today with Dr. Talbert Nan. She said she recently had orthopaedic surgery that Dr. Talbert Nan knows about. Today, she said she cannot get out of bed and will need to see her orthopaedic surgeon instead.  Additionally, she wanted to let Dr. Talbert Nan to know she has stopped bleeding.   Patient will call back to reschedule. Routing to provider for review.

## 2018-07-30 DIAGNOSIS — S32511K Fracture of superior rim of right pubis, subsequent encounter for fracture with nonunion: Secondary | ICD-10-CM | POA: Diagnosis not present

## 2018-07-30 DIAGNOSIS — S32592K Other specified fracture of left pubis, subsequent encounter for fracture with nonunion: Secondary | ICD-10-CM | POA: Diagnosis not present

## 2018-07-30 DIAGNOSIS — M4326 Fusion of spine, lumbar region: Secondary | ICD-10-CM | POA: Diagnosis not present

## 2018-07-30 DIAGNOSIS — S32591K Other specified fracture of right pubis, subsequent encounter for fracture with nonunion: Secondary | ICD-10-CM | POA: Diagnosis not present

## 2018-07-30 DIAGNOSIS — S3210XK Unspecified fracture of sacrum, subsequent encounter for fracture with nonunion: Secondary | ICD-10-CM | POA: Diagnosis not present

## 2018-07-30 DIAGNOSIS — M5417 Radiculopathy, lumbosacral region: Secondary | ICD-10-CM | POA: Diagnosis not present

## 2018-07-30 DIAGNOSIS — M955 Acquired deformity of pelvis: Secondary | ICD-10-CM | POA: Diagnosis not present

## 2018-07-30 DIAGNOSIS — S32512K Fracture of superior rim of left pubis, subsequent encounter for fracture with nonunion: Secondary | ICD-10-CM | POA: Diagnosis not present

## 2018-07-30 DIAGNOSIS — M160 Bilateral primary osteoarthritis of hip: Secondary | ICD-10-CM | POA: Diagnosis not present

## 2018-08-01 DIAGNOSIS — G4730 Sleep apnea, unspecified: Secondary | ICD-10-CM | POA: Diagnosis not present

## 2018-08-05 ENCOUNTER — Encounter: Payer: Self-pay | Admitting: Obstetrics and Gynecology

## 2018-08-05 ENCOUNTER — Ambulatory Visit: Payer: BLUE CROSS/BLUE SHIELD | Admitting: Obstetrics and Gynecology

## 2018-08-05 ENCOUNTER — Other Ambulatory Visit: Payer: Self-pay

## 2018-08-05 VITALS — BP 120/84 | HR 80

## 2018-08-05 DIAGNOSIS — Z3009 Encounter for other general counseling and advice on contraception: Secondary | ICD-10-CM

## 2018-08-05 DIAGNOSIS — N939 Abnormal uterine and vaginal bleeding, unspecified: Secondary | ICD-10-CM

## 2018-08-05 MED ORDER — MEDROXYPROGESTERONE ACETATE 10 MG PO TABS: ORAL_TABLET | ORAL | 2 refills | Status: DC

## 2018-08-05 NOTE — Progress Notes (Signed)
GYNECOLOGY  VISIT   HPI: 38 y.o.   Married Asian Not Hispanic or Latino  female   [phone] with No LMP recorded.   here for AUB. Patient has been talking Provera 2 tablets daily. Bleeding stopped on Monday 07/29/2018. Spotting returned on 07/31/2018. On 08/04/2018 bleeding increased. Changing 1 pad a day. Is currently taking Provera 1 tablet daily. The patient was started on OCP's in the fall for AUB leading to anemia. The pill was controlling her cycle well but she went off in 12/19 when she had major surgery (she had her pelvis refractured and repaired).  She stayed on her OCP's during surgery and immediately post op. She went off of her pills on 07/19/18 secondary to severe nausea. She had 1.5 weeks of the active pills left. She started 3 days out, heavy at times, changing 2 pads day. She was started on provera 10 mg BID on 07/23/18, bleeding slowed down and stopped, she went down to one pill a day and started spotting. She continues to bleed. Last night she was bleeding heavier, only changed one pad, it was full.   GYNECOLOGIC HISTORY: No LMP recorded. Contraception: None Menopausal hormone therapy: None       OB History    Gravida  3   Para  3   Term  3   Preterm  0   AB  0   Living  3     SAB  0   TAB  0   Ectopic  0   Multiple  0   Live Births  3              Patient Active Problem List   Diagnosis Date Noted  . DUB (dysfunctional uterine bleeding) 07/23/2018  . 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 Acute blood loss anemia 06/25/2018  . Pelvic deformity 04/24/2018  . 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 Lumbosacral radiculopathy at L5 03/15/2018  . Pain in right ankle and joints of right foot 01/21/2018  . Low vitamin D level 01/01/2018  . Obesity (BMI 30-39.9) 06/23/2017    Past Medical History:  Diagnosis Date  . Abnormal uterine bleeding   . Allergy    seasonal  . Fibroid   . Gallstones     Past Surgical History:   Procedure Laterality Date  . BALLOON DILATION N/A 11/12/2013   Procedure: BALLOON DILATION WITH STONE EXTRACTION;  Surgeon: Daneil Dolin, MD;  Location: [facility];  Service: Endoscopy;  Laterality: N/A;  . CESAREAN SECTION    . CHOLECYSTECTOMY N/A 11/05/2013   Dr. Arnoldo Morale  . ERCP N/A 11/12/2013   Procedure: ENDOSCOPIC RETROGRADE CHOLANGIOPANCREATOGRAPHY (ERCP), SPHINCTEROTOMY, BALLOON DILATION WITH STONE EXTRACTION;  Surgeon: Daneil Dolin, MD;  Location: [facility];  Service: Endoscopy;  Laterality: N/A;  . PELVIC FRACTURE SURGERY    . SPHINCTEROTOMY N/A 11/12/2013   Procedure: SPHINCTEROTOMY;  Surgeon: Daneil Dolin, MD;  Location: [facility];  Service: Endoscopy;  Laterality: N/A;    Current Outpatient Medications  Medication Sig Dispense Refill  . DULoxetine (CYMBALTA) 30 MG capsule Take by mouth.    Marland Kitchen HYDROmorphone (DILAUDID) 4 MG tablet     . linaclotide (LINZESS) 145 MCG CAPS capsule Take 1 capsule (145 mcg total) by mouth daily before breakfast. 30 capsule 3  . medroxyPROGESTERone (PROVERA) 10 MG tablet Take 10MG  BID until bleeding stops then 10mg  once a day 60 tablet 1  . pregabalin (LYRICA) 75 MG capsule Take 1 capsule (  75 mg total) by mouth 2 (two) times daily. 60 capsule 5  . Vitamin D, Ergocalciferol, (DRISDOL) 50000 units CAPS capsule Take 1 capsule (50,000 Units total) by mouth every 7 (seven) days. x6 months. 12 capsule 2  . diclofenac sodium (VOLTAREN) 1 % GEL Apply 2 g topically 4 (four) times daily. (Patient not taking: Reported on 08/05/2018) 1 Tube 1   No current facility-administered medications for this visit.      ALLERGIES: Patient has no known allergies.  Family History  Problem Relation Age of Onset  . Cancer Mother        breast  . Diabetes Mother   . Hypertension Mother   . Breast cancer Mother   . Hyperlipidemia Father   . Diabetes Father   . Diabetes Brother   . Hyperlipidemia Brother   . Hyperlipidemia Brother   . Colon cancer Neg Hx     Social History    Socioeconomic History  . Marital status: Married    Spouse name: Not on file  . Number of children: 3  . Years of education: Not on file  . Highest education level: Not on file  Occupational History  . Not on file  Social Needs  . Financial resource strain: Not hard at all  . Food insecurity:    Worry: Never true    Inability: Never true  . Transportation needs:    Medical: No    Non-medical: No  Tobacco Use  . Smoking status: Never Smoker  . Smokeless tobacco: Never Used  Substance and Sexual Activity  . Alcohol use: No    Frequency: Never    Comment: rarely  . Drug use: No  . Sexual activity: Not Currently    Birth control/protection: None  Lifestyle  . Physical activity:    Days per week: Not on file    Minutes per session: Not on file  . Stress: Not on file  Relationships  . Social connections:    Talks on phone: Not on file    Gets together: Not on file    Attends religious service: Not on file    Active member of club or organization: Not on file    Attends meetings of clubs or organizations: Not on file    Relationship status: Not on file  . Intimate partner violence:    Fear of current or ex partner: Not on file    Emotionally abused: Not on file    Physically abused: Not on file    Forced sexual activity: Not on file  Other Topics Concern  . Not on file  Social History Narrative  . Not on file    Review of Systems  Constitutional: Negative.   HENT: Negative.   Eyes: Negative.   Respiratory: Negative.   Cardiovascular: Negative.   Gastrointestinal: Negative.   Genitourinary:       AUB  Musculoskeletal: Negative.   Skin: Negative.   Neurological: Negative.   Endo/Heme/Allergies: Negative.   Psychiatric/Behavioral: Negative.     PHYSICAL EXAMINATION:    BP 120/84 (BP Location: Left Arm, Patient Position: Sitting, Cuff Size: Normal)   Pulse 80     General appearance: alert, cooperative and appears stated age   ASSESSMENT AUB, s/p  major reconstruction of her fractured pelvis She was on OCP's previously for contraception and to control her bleeding. She was on her pills during her surgery and post op. She stopped because her pills were making her nauseous. She is not on anticoagulation and she  is immobile increasing her risk of blood clots, I would not recommend she be on OCP's until she is ambulating again.     PLAN Will increase provera to BID to stop the bleeding She will call if the bleeding persists She and her husband are asking about hysterectomy. I wouldn't recommend hysterectomy at this time. She has had 3 C/S and her big pelvic surgery (states her bladder was mobilized) she is at increased risk of adhesion and injury.  We discussed restarting the pill or nuvaring when she is ambulating or using the mirena She is interested in the [place], will wait until she has healed more prior to inserting.    An After Visit Summary was printed and given to the patient.  ~15 minutes face to face time of which over 50% was spent in counseling.    CC: Dr Buelah Manis

## 2018-08-06 DIAGNOSIS — Z793 Long term (current) use of hormonal contraceptives: Secondary | ICD-10-CM | POA: Diagnosis not present

## 2018-08-06 DIAGNOSIS — Z981 Arthrodesis status: Secondary | ICD-10-CM | POA: Diagnosis not present

## 2018-08-06 DIAGNOSIS — Z7901 Long term (current) use of anticoagulants: Secondary | ICD-10-CM | POA: Diagnosis not present

## 2018-08-06 DIAGNOSIS — Z9181 History of falling: Secondary | ICD-10-CM | POA: Diagnosis not present

## 2018-08-06 DIAGNOSIS — S3289XK Fracture of other parts of pelvis, subsequent encounter for fracture with nonunion: Secondary | ICD-10-CM | POA: Diagnosis not present

## 2018-08-13 DIAGNOSIS — Z9181 History of falling: Secondary | ICD-10-CM | POA: Diagnosis not present

## 2018-08-13 DIAGNOSIS — Z7901 Long term (current) use of anticoagulants: Secondary | ICD-10-CM | POA: Diagnosis not present

## 2018-08-13 DIAGNOSIS — Z981 Arthrodesis status: Secondary | ICD-10-CM | POA: Diagnosis not present

## 2018-08-13 DIAGNOSIS — Z793 Long term (current) use of hormonal contraceptives: Secondary | ICD-10-CM | POA: Diagnosis not present

## 2018-08-13 DIAGNOSIS — S3289XK Fracture of other parts of pelvis, subsequent encounter for fracture with nonunion: Secondary | ICD-10-CM | POA: Diagnosis not present

## 2018-08-14 ENCOUNTER — Telehealth: Payer: Self-pay | Admitting: Obstetrics and Gynecology

## 2018-08-14 MED ORDER — NORETHINDRONE ACETATE 5 MG PO TABS: 5.0000 mg | ORAL_TABLET | Freq: Three times a day (TID) | ORAL | 2 refills | Status: DC

## 2018-08-14 NOTE — Telephone Encounter (Signed)
Patient is experiencing heavy bleeding.

## 2018-08-14 NOTE — Telephone Encounter (Signed)
I'm worried if she is lightheaded with sitting that she could be very anemia, h/o anemia. She should go to the ER. As far as her bleeding goes, we can change her to Aygestin 5 mg TID. If she continues to bleed, she will need an ultrasound, possibly more intervention.

## 2018-08-14 NOTE — Telephone Encounter (Signed)
Spoke with patient. Patient seen in office om 08/05/18 for AUB. Started provera 10 mg bid. Patient reports no change in bleeding. Changing a 10 hour pad 4 x/day. 2 quarter size clots and several small "pieces". Lightheaded when sitting, does not ambulate often. Denies fatigue, SOB, weakness, HA, pelvic pain. Patient states she was advised to call, declines OV at this time, request to review with Dr. Talbert Nan first. Advised will review with Dr. Talbert Nan and return call. Patient agreeable.

## 2018-08-14 NOTE — Telephone Encounter (Signed)
Call returned to patient. Advised as seen below per Dr. Talbert Nan. Patient declines ER at this time, reports lightheadedness is intermittent, "does not happen often". Patient states "if bleeding continues and lightheadedness continues, I will go to ER for evaluation". OV scheduled for 2/27 at 1:15pm with Dr. Talbert Nan. Rx for Aygestin 5mg  PO TID #90/2RF to verified pharmacy. Patient verbalizes understanding, ER precautions reviewed.   Routing to provider for final review. Patient is agreeable to disposition. Will close encounter.

## 2018-08-15 ENCOUNTER — Ambulatory Visit: Payer: Self-pay | Admitting: Obstetrics and Gynecology

## 2018-08-15 ENCOUNTER — Telehealth: Payer: Self-pay | Admitting: Obstetrics and Gynecology

## 2018-08-15 NOTE — Telephone Encounter (Signed)
No answer, mailbox full, unable to leave message.

## 2018-08-15 NOTE — Telephone Encounter (Signed)
She should be taking the aygestin 3 x a day. Her bleeding should slow down in the next few days. She really needs a CBC and ferritin. Can she at least come by to have that done?

## 2018-08-15 NOTE — Telephone Encounter (Signed)
Patient missed her appointment today for AUB. She stated she forgot about the appointment. To triage to assist with rescheduling with Dr.Jerton. (Appointment was scheduled yesterday at 5:03pm)

## 2018-08-15 NOTE — Telephone Encounter (Signed)
Spoke with patient. Patient states she forgot about appointment today. Started Aygestin 5mg  last night, reports no change in bleeding. Changing saturated overnight pad q4hrs. Denies lightheadedness, dizziness, weakness, SOB, fatigue.   ER precautions reviewed with patient. OV rescheduled for 08/19/18 at 10:30am with Dr. Talbert Nan. Patient to continue Aygestin, will call to provide update on bleeding on 2/28. Advised I will review with Dr. Talbert Nan and return call with ant additional recommendations. Patient Verbalizes understanding.   Routing to Dr. Talbert Nan.

## 2018-08-16 NOTE — Telephone Encounter (Signed)
No answer, mailbox full, unable to leave message.

## 2018-08-16 NOTE — Telephone Encounter (Signed)
Spoke with patient. Patient reports no change in bleeding, changes saturated overnight pad approximately q4 hrs. Denies lightheadedness, dizziness, SOB, weakness, fatigue. Patient declines lab appointment today due to no transportation. Patient also requesting to change appointment on Monday to late afternoon due to no transportation. ER precautions reviewed with patient. OV rescheduled to 3/2 at 4:15pm with Dr. Talbert Nan. Advised I will have to review schedule with Dr. Talbert Nan, our office will return call if any additional recommendations. Patient verbalizes understanding.   Routing to provider for final review. Patient is agreeable to disposition. Will close encounter.

## 2018-08-19 ENCOUNTER — Ambulatory Visit: Payer: Self-pay | Admitting: Obstetrics and Gynecology

## 2018-08-19 ENCOUNTER — Encounter: Payer: Self-pay | Admitting: Obstetrics and Gynecology

## 2018-08-19 ENCOUNTER — Other Ambulatory Visit: Payer: Self-pay

## 2018-08-19 ENCOUNTER — Ambulatory Visit: Payer: BLUE CROSS/BLUE SHIELD | Admitting: Obstetrics and Gynecology

## 2018-08-19 VITALS — BP 118/84 | HR 88

## 2018-08-19 DIAGNOSIS — D500 Iron deficiency anemia secondary to blood loss (chronic): Secondary | ICD-10-CM

## 2018-08-19 DIAGNOSIS — N939 Abnormal uterine and vaginal bleeding, unspecified: Secondary | ICD-10-CM

## 2018-08-19 DIAGNOSIS — Z862 Personal history of diseases of the blood and blood-forming organs and certain disorders involving the immune mechanism: Secondary | ICD-10-CM

## 2018-08-19 DIAGNOSIS — Z3009 Encounter for other general counseling and advice on contraception: Secondary | ICD-10-CM | POA: Diagnosis not present

## 2018-08-19 NOTE — Progress Notes (Signed)
GYNECOLOGY  VISIT   HPI: 38 y.o.   Married Asian Not Hispanic or Latino  female   [phone] with No LMP recorded.   here for abnormal uterine bleeding. She was seen in the fall of 2019 for a several month h/o AUB. At that time she wasn't anemic, she had a normal TSH and a normal pap. Ultrasound was c/w adenomyosis. She was started on OCP's which controlled her bleeding.  She is s/p major pelvic bone reconstruction in 12/19. Last Hgb there was 8.8 on 06/08/18.  She was continued on OCP's during her surgery and postoperatively. She went off of OCP's on January 31 secondary to nausea. She started bleeding heavily a few days after stopping the pills. She was started on provera 10 mg BID on 07/23/18 to try and stop her bleeding. The provera temporarily helped, but the bleeding resumed.  On 08/14/18 she was changed to Aygestin 5 mg TID. Her bleeding has lightened but not stopped. She is at most changing a pad every 4 hours.  She is tired of the bleeding and the pads. Not sexually active since prior to her pelvic surgery.    She is walking with her walker daily. See's PT one day a week.     GYNECOLOGIC HISTORY: No LMP recorded. Contraception:None Menopausal hormone therapy: None        OB History    Gravida  3   Para  3   Term  3   Preterm  0   AB  0   Living  3     SAB  0   TAB  0   Ectopic  0   Multiple  0   Live Births  3              Patient Active Problem List   Diagnosis Date Noted  . DUB (dysfunctional uterine bleeding) 07/23/2018  . 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 Acute blood loss anemia 06/25/2018  . Pelvic deformity 04/24/2018  . 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 Lumbosacral radiculopathy at L5 03/15/2018  . Pain in right ankle and joints of right foot 01/21/2018  . Low vitamin D level 01/01/2018  . Obesity (BMI 30-39.9) 06/23/2017    Past Medical History:  Diagnosis Date  . Abnormal uterine bleeding   .  Allergy    seasonal  . Fibroid   . Gallstones     Past Surgical History:  Procedure Laterality Date  . BALLOON DILATION N/A 11/12/2013   Procedure: BALLOON DILATION WITH STONE EXTRACTION;  Surgeon: Daneil Dolin, MD;  Location: [facility];  Service: Endoscopy;  Laterality: N/A;  . CESAREAN SECTION    . CHOLECYSTECTOMY N/A 11/05/2013   Dr. Arnoldo Morale  . ERCP N/A 11/12/2013   Procedure: ENDOSCOPIC RETROGRADE CHOLANGIOPANCREATOGRAPHY (ERCP), SPHINCTEROTOMY, BALLOON DILATION WITH STONE EXTRACTION;  Surgeon: Daneil Dolin, MD;  Location: [facility];  Service: Endoscopy;  Laterality: N/A;  . PELVIC FRACTURE SURGERY    . SPHINCTEROTOMY N/A 11/12/2013   Procedure: SPHINCTEROTOMY;  Surgeon: Daneil Dolin, MD;  Location: [facility];  Service: Endoscopy;  Laterality: N/A;    Current Outpatient Medications  Medication Sig Dispense Refill  . acetaminophen (TYLENOL) 500 MG tablet Take 500 mg by mouth 2 (two) times daily.    . DULoxetine (CYMBALTA) 30 MG capsule Take by mouth.    Marland Kitchen HYDROmorphone (DILAUDID) 4 MG tablet     . linaclotide (LINZESS) 145 MCG CAPS capsule Take 1 capsule (145  mcg total) by mouth daily before breakfast. 30 capsule 3  . Multiple Vitamin (MULTIVITAMIN) tablet Take 1 tablet by mouth daily. Flinstone vitamin    . norethindrone (AYGESTIN) 5 MG tablet Take 1 tablet (5 mg total) by mouth 3 (three) times daily. 90 tablet 2  . pregabalin (LYRICA) 75 MG capsule Take 1 capsule (75 mg total) by mouth 2 (two) times daily. 60 capsule 5  . Vitamin D, Ergocalciferol, (DRISDOL) 50000 units CAPS capsule Take 1 capsule (50,000 Units total) by mouth every 7 (seven) days. x6 months. 12 capsule 2  . diclofenac sodium (VOLTAREN) 1 % GEL Apply 2 g topically 4 (four) times daily. (Patient not taking: Reported on 08/05/2018) 1 Tube 1   No current facility-administered medications for this visit.      ALLERGIES: Patient has no known allergies.  Family History  Problem Relation Age of Onset  . Cancer Mother         breast  . Diabetes Mother   . Hypertension Mother   . Breast cancer Mother   . Hyperlipidemia Father   . Diabetes Father   . Diabetes Brother   . Hyperlipidemia Brother   . Hyperlipidemia Brother   . Colon cancer Neg Hx     Social History   Socioeconomic History  . Marital status: Married    Spouse name: Not on file  . Number of children: 3  . Years of education: Not on file  . Highest education level: Not on file  Occupational History  . Not on file  Social Needs  . Financial resource strain: Not hard at all  . Food insecurity:    Worry: Never true    Inability: Never true  . Transportation needs:    Medical: No    Non-medical: No  Tobacco Use  . Smoking status: Never Smoker  . Smokeless tobacco: Never Used  Substance and Sexual Activity  . Alcohol use: No    Frequency: Never    Comment: rarely  . Drug use: No  . Sexual activity: Not Currently    Birth control/protection: None  Lifestyle  . Physical activity:    Days per week: Not on file    Minutes per session: Not on file  . Stress: Not on file  Relationships  . Social connections:    Talks on phone: Not on file    Gets together: Not on file    Attends religious service: Not on file    Active member of club or organization: Not on file    Attends meetings of clubs or organizations: Not on file    Relationship status: Not on file  . Intimate partner violence:    Fear of current or ex partner: Not on file    Emotionally abused: Not on file    Physically abused: Not on file    Forced sexual activity: Not on file  Other Topics Concern  . Not on file  Social History Narrative  . Not on file    Review of Systems  Constitutional: Negative.   HENT: Negative.   Eyes: Negative.   Respiratory: Negative.   Cardiovascular: Negative.   Gastrointestinal: Negative.   Genitourinary:       AUB  Musculoskeletal: Negative.   Skin: Negative.   Neurological: Negative.   Endo/Heme/Allergies: Negative.    Psychiatric/Behavioral: Negative.     PHYSICAL EXAMINATION:    BP 118/84 (BP Location: Right Arm, Patient Position: Sitting, Cuff Size: Normal)   Pulse 88  General appearance: alert, cooperative and appears stated age  ASSESSMENT S/P major surgery on her pelvic bones in 12/20, ambulating now with a walker AUB, prior negative w/u other than suspected adenomyosis on ultrasound Contraception management    PLAN CBC, Ferritin sent She isn't tolerating oral iron, will send for iron transfusion We discussed options of treatment, including the birth control pills, the mirena IUD and hysterectomy She does need contraception. She would like to try the IUD, she has already been given information   An After Visit Summary was printed and given to the patient.  ~15 minutes face to face time of which over 50% was spent in counseling.   CC: Dr Buelah Manis

## 2018-08-20 DIAGNOSIS — S3289XK Fracture of other parts of pelvis, subsequent encounter for fracture with nonunion: Secondary | ICD-10-CM | POA: Diagnosis not present

## 2018-08-20 DIAGNOSIS — Z981 Arthrodesis status: Secondary | ICD-10-CM | POA: Diagnosis not present

## 2018-08-20 DIAGNOSIS — Z793 Long term (current) use of hormonal contraceptives: Secondary | ICD-10-CM | POA: Diagnosis not present

## 2018-08-20 DIAGNOSIS — Z7901 Long term (current) use of anticoagulants: Secondary | ICD-10-CM | POA: Diagnosis not present

## 2018-08-20 DIAGNOSIS — Z9181 History of falling: Secondary | ICD-10-CM | POA: Diagnosis not present

## 2018-08-20 LAB — CBC WITH DIFFERENTIAL/PLATELET
Basophils Absolute: 0.1 10*3/uL (ref 0.0–0.2)
Basos: 0 %
EOS (ABSOLUTE): 0.8 10*3/uL — ABNORMAL HIGH (ref 0.0–0.4)
EOS: 7 %
Hematocrit: 33.1 % — ABNORMAL LOW (ref 34.0–46.6)
Hemoglobin: 10.2 g/dL — ABNORMAL LOW (ref 11.1–15.9)
Immature Grans (Abs): 0 10*3/uL (ref 0.0–0.1)
Immature Granulocytes: 0 %
Lymphocytes Absolute: 4.9 10*3/uL — ABNORMAL HIGH (ref 0.7–3.1)
Lymphs: 45 %
MCH: 25.5 pg — ABNORMAL LOW (ref 26.6–33.0)
MCHC: 30.8 g/dL — ABNORMAL LOW (ref 31.5–35.7)
MCV: 83 fL (ref 79–97)
MONOCYTES: 4 %
MONOS ABS: 0.5 10*3/uL (ref 0.1–0.9)
Neutrophils Absolute: 4.9 10*3/uL (ref 1.4–7.0)
Neutrophils: 44 %
Platelets: 532 10*3/uL — ABNORMAL HIGH (ref 150–450)
RBC: 4 x10E6/uL (ref 3.77–5.28)
RDW: 13.1 % (ref 11.7–15.4)
WBC: 11.2 10*3/uL — ABNORMAL HIGH (ref 3.4–10.8)

## 2018-08-20 LAB — FERRITIN: Ferritin: 17 ng/mL (ref 15–150)

## 2018-08-21 ENCOUNTER — Telehealth: Payer: Self-pay | Admitting: Family

## 2018-08-21 NOTE — Telephone Encounter (Signed)
Spoke with patient to confirm new patient appt 3/24 at 1 pm. Mailed appt leter to confirm appt location/date/time

## 2018-08-24 ENCOUNTER — Other Ambulatory Visit (INDEPENDENT_AMBULATORY_CARE_PROVIDER_SITE_OTHER): Payer: Self-pay | Admitting: Orthopedic Surgery

## 2018-08-27 DIAGNOSIS — Z4789 Encounter for other orthopedic aftercare: Secondary | ICD-10-CM | POA: Diagnosis not present

## 2018-08-27 DIAGNOSIS — S329XXK Fracture of unspecified parts of lumbosacral spine and pelvis, subsequent encounter for fracture with nonunion: Secondary | ICD-10-CM | POA: Diagnosis not present

## 2018-08-27 DIAGNOSIS — X58XXXD Exposure to other specified factors, subsequent encounter: Secondary | ICD-10-CM | POA: Diagnosis not present

## 2018-09-01 DIAGNOSIS — G4730 Sleep apnea, unspecified: Secondary | ICD-10-CM | POA: Diagnosis not present

## 2018-09-02 ENCOUNTER — Ambulatory Visit (INDEPENDENT_AMBULATORY_CARE_PROVIDER_SITE_OTHER): Payer: BLUE CROSS/BLUE SHIELD | Admitting: Obstetrics and Gynecology

## 2018-09-02 ENCOUNTER — Encounter: Payer: Self-pay | Admitting: Obstetrics and Gynecology

## 2018-09-02 ENCOUNTER — Other Ambulatory Visit: Payer: Self-pay

## 2018-09-02 VITALS — BP 122/74 | HR 86 | Resp 16 | Ht 62.0 in

## 2018-09-02 DIAGNOSIS — Z30430 Encounter for insertion of intrauterine contraceptive device: Secondary | ICD-10-CM | POA: Diagnosis not present

## 2018-09-02 DIAGNOSIS — N939 Abnormal uterine and vaginal bleeding, unspecified: Secondary | ICD-10-CM

## 2018-09-02 DIAGNOSIS — Z3009 Encounter for other general counseling and advice on contraception: Secondary | ICD-10-CM | POA: Diagnosis not present

## 2018-09-02 DIAGNOSIS — G4730 Sleep apnea, unspecified: Secondary | ICD-10-CM | POA: Diagnosis not present

## 2018-09-02 NOTE — Patient Instructions (Signed)
IUD Post-procedure Instructions Cramping is common.  You may take Ibuprofen, Aleve, or Tylenol for the cramping.  This should resolve within 24 hours.   You may have a small amount of spotting.  You should wear a mini pad for the next few days. You may have intercourse in 24 hours. You need to call the office if you have any pelvic pain, fever, heavy bleeding, or foul smelling vaginal discharge. Shower or bathe as normal Use back up contraception for one week 

## 2018-09-02 NOTE — Progress Notes (Signed)
GYNECOLOGY  VISIT   HPI: 38 y.o.   Married Asian Not Hispanic or Latino  female   [phone] with No LMP recorded.   here for Mirena IUD insertion. She needs this for contraception and AUB.   She hasn't been sexually active since way before her surgery in 12/19. Negative pregnancy test on 06/13/18. The plate and pins that were placed on her pelvic bones during her surgery in 12/19 have broken. She is going to need to have repeat surgery.   GYNECOLOGIC HISTORY: No LMP recorded. Contraception:abstinence  Menopausal hormone therapy: NA        OB History    Gravida  3   Para  3   Term  3   Preterm  0   AB  0   Living  3     SAB  0   TAB  0   Ectopic  0   Multiple  0   Live Births  3              Patient Active Problem List   Diagnosis Date Noted  . DUB (dysfunctional uterine bleeding) 07/23/2018  . 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 Acute blood loss anemia 06/25/2018  . Pelvic deformity 04/24/2018  . 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 Lumbosacral radiculopathy at L5 03/15/2018  . Pain in right ankle and joints of right foot 01/21/2018  . Low vitamin D level 01/01/2018  . Obesity (BMI 30-39.9) 06/23/2017    Past Medical History:  Diagnosis Date  . Abnormal uterine bleeding   . Allergy    seasonal  . Fibroid   . Gallstones     Past Surgical History:  Procedure Laterality Date  . BALLOON DILATION N/A 11/12/2013   Procedure: BALLOON DILATION WITH STONE EXTRACTION;  Surgeon: Daneil Dolin, MD;  Location: [facility];  Service: Endoscopy;  Laterality: N/A;  . CESAREAN SECTION    . CHOLECYSTECTOMY N/A 11/05/2013   Dr. Arnoldo Morale  . ERCP N/A 11/12/2013   Procedure: ENDOSCOPIC RETROGRADE CHOLANGIOPANCREATOGRAPHY (ERCP), SPHINCTEROTOMY, BALLOON DILATION WITH STONE EXTRACTION;  Surgeon: Daneil Dolin, MD;  Location: [facility];  Service: Endoscopy;  Laterality: N/A;  . PELVIC FRACTURE SURGERY    . SPHINCTEROTOMY N/A 11/12/2013   Procedure: SPHINCTEROTOMY;  Surgeon: Daneil Dolin, MD;  Location: [facility];  Service: Endoscopy;  Laterality: N/A;    Current Outpatient Medications  Medication Sig Dispense Refill  . acetaminophen (TYLENOL) 500 MG tablet Take 500 mg by mouth 2 (two) times daily.    . diclofenac sodium (VOLTAREN) 1 % GEL Apply 2 g topically 4 (four) times daily. (Patient not taking: Reported on 08/05/2018) 1 Tube 1  . DULoxetine (CYMBALTA) 30 MG capsule Take by mouth.    Marland Kitchen HYDROmorphone (DILAUDID) 4 MG tablet     . ibuprofen (ADVIL,MOTRIN) 800 MG tablet TAKE 1 TABLET(800 MG) BY MOUTH TWICE DAILY 30 tablet 0  . linaclotide (LINZESS) 145 MCG CAPS capsule Take 1 capsule (145 mcg total) by mouth daily before breakfast. 30 capsule 3  . Multiple Vitamin (MULTIVITAMIN) tablet Take 1 tablet by mouth daily. Flinstone vitamin    . norethindrone (AYGESTIN) 5 MG tablet Take 1 tablet (5 mg total) by mouth 3 (three) times daily. 90 tablet 2  . pregabalin (LYRICA) 75 MG capsule Take 1 capsule (75 mg total) by mouth 2 (two) times daily. 60 capsule 5  . Vitamin D, Ergocalciferol, (DRISDOL) 50000 units CAPS capsule Take 1 capsule (50,000 Units  total) by mouth every 7 (seven) days. x6 months. 12 capsule 2   No current facility-administered medications for this visit.      ALLERGIES: Patient has no known allergies.  Family History  Problem Relation Age of Onset  . Cancer Mother        breast  . Diabetes Mother   . Hypertension Mother   . Breast cancer Mother   . Hyperlipidemia Father   . Diabetes Father   . Diabetes Brother   . Hyperlipidemia Brother   . Hyperlipidemia Brother   . Colon cancer Neg Hx     Social History   Socioeconomic History  . Marital status: Married    Spouse name: Not on file  . Number of children: 3  . Years of education: Not on file  . Highest education level: Not on file  Occupational History  . Not on file  Social Needs  . Financial resource strain: Not hard at all  . Food  insecurity:    Worry: Never true    Inability: Never true  . Transportation needs:    Medical: No    Non-medical: No  Tobacco Use  . Smoking status: Never Smoker  . Smokeless tobacco: Never Used  Substance and Sexual Activity  . Alcohol use: No    Frequency: Never    Comment: rarely  . Drug use: No  . Sexual activity: Not Currently    Birth control/protection: None  Lifestyle  . Physical activity:    Days per week: Not on file    Minutes per session: Not on file  . Stress: Not on file  Relationships  . Social connections:    Talks on phone: Not on file    Gets together: Not on file    Attends religious service: Not on file    Active member of club or organization: Not on file    Attends meetings of clubs or organizations: Not on file    Relationship status: Not on file  . Intimate partner violence:    Fear of current or ex partner: Not on file    Emotionally abused: Not on file    Physically abused: Not on file    Forced sexual activity: Not on file  Other Topics Concern  . Not on file  Social History Narrative  . Not on file    Review of Systems  Gastrointestinal: Positive for constipation.  All other systems reviewed and are negative.   PHYSICAL EXAMINATION:    There were no vitals taken for this visit.    General appearance: alert, cooperative and appears stated age  Pelvic: External genitalia:  no lesions              Urethra:  normal appearing urethra with no masses, tenderness or lesions              Bartholins and Skenes: normal                 Vagina: normal appearing vagina with normal color and discharge, no lesions              Cervix: no lesions  The risks of the mirena IUD were reviewed with the patient, including infection, abnormal bleeding and uterine perfortion. Consent was signed.  A speculum was placed in the vagina, the cervix was cleansed with betadine. A tenaculum was placed on the cervix, the uterus sounded to ~7 cm. The cervix was  dilated to a #5 hagar dilator  The mirena  IUD was inserted without difficulty. The string were cut to 3-4 cm. The tenaculum was removed. Slight oozing from the tenaculum site was stopped with pressure.   The patient tolerated the procedure well.    Chaperone was present for exam.  ASSESSMENT Contraception management AUB, prior ultrasound c/w adenomyosis Anemia    PLAN Mirena IUD placed F/U in one month for IUD check (or as soon as she can after her next surgery) Has appointment with hematology next week for iron transfusion   An After Visit Summary was printed and given to the patient.

## 2018-09-03 ENCOUNTER — Telehealth: Payer: Self-pay | Admitting: Obstetrics and Gynecology

## 2018-09-03 NOTE — Telephone Encounter (Signed)
Patient has a question about the mirena she had inserted yesterday.

## 2018-09-03 NOTE — Telephone Encounter (Signed)
Spoke with patient. Mirena IUD placed 09/02/18. States the IUD threads are "scratching her and hurting". She can feel "strings scratching or poking" feeling when lying down and when sleeping. Has applied cream to vagina for relief, no change. Denies vaginal bleeding, pain or fever/chills, vaginal odor, N/V. Recommended OV for further evaluation. Patient declined OV today. OV scheduled for 3/18 at 11am with Dr. Talbert Nan.   Routing to provider for final review. Patient is agreeable to disposition. Will close encounter.

## 2018-09-04 ENCOUNTER — Other Ambulatory Visit: Payer: Self-pay

## 2018-09-04 ENCOUNTER — Ambulatory Visit (INDEPENDENT_AMBULATORY_CARE_PROVIDER_SITE_OTHER): Payer: BLUE CROSS/BLUE SHIELD | Admitting: Obstetrics and Gynecology

## 2018-09-04 ENCOUNTER — Telehealth: Payer: Self-pay | Admitting: Obstetrics and Gynecology

## 2018-09-04 ENCOUNTER — Encounter: Payer: Self-pay | Admitting: Obstetrics and Gynecology

## 2018-09-04 VITALS — BP 126/84 | HR 84 | Ht 63.0 in | Wt 169.0 lb

## 2018-09-04 DIAGNOSIS — N819 Female genital prolapse, unspecified: Secondary | ICD-10-CM | POA: Insufficient documentation

## 2018-09-04 DIAGNOSIS — Z30431 Encounter for routine checking of intrauterine contraceptive device: Secondary | ICD-10-CM | POA: Diagnosis not present

## 2018-09-04 DIAGNOSIS — G4730 Sleep apnea, unspecified: Secondary | ICD-10-CM | POA: Diagnosis not present

## 2018-09-04 NOTE — Telephone Encounter (Signed)
Spoke with patient. Patient reports red, itchy, bumpy rash in vaginal canal. Feels like IUD string are rubbing and causing irritation. Requesting Rx. Recommended OV with Dr. Talbert Nan to determine if the IUD strings need to be trimmed or if related to vaginitis. Patient will keep OV as scheduled for today at 11am with Dr. Talbert Nan. Patient verbalizes understanding and is agreeable.   Encounter closed.

## 2018-09-04 NOTE — Progress Notes (Signed)
GYNECOLOGY  VISIT   HPI: 38 y.o.   Married Asian Not Hispanic or Latino  female  [phone] with No LMP recorded.   here for IUD check. Feels strings are poking her and causing irritation. She had a mirena placed in 3/16. No abdominal pain, no bleeding. She had major reconstruction of her pelvis in 12/19, the plates/screws are breaking, the cadaver bone isn't setting properly. She will need to have another surgery in the next few weeks.     GYNECOLOGIC HISTORY: No LMP recorded. Contraception: IUD Menopausal hormone therapy: None        OB History    Gravida  3   Para  3   Term  3   Preterm  0   AB  0   Living  3     SAB  0   TAB  0   Ectopic  0   Multiple  0   Live Births  3              Patient Active Problem List   Diagnosis Date Noted  . DUB (dysfunctional uterine bleeding) 07/23/2018  . 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 Acute blood loss anemia 06/25/2018  . Pelvic deformity 04/24/2018  . 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 Lumbosacral radiculopathy at L5 03/15/2018  . Pain in right ankle and joints of right foot 01/21/2018  . Low vitamin D level 01/01/2018  . Obesity (BMI 30-39.9) 06/23/2017    Past Medical History:  Diagnosis Date  . Abnormal uterine bleeding   . Allergy    seasonal  . Fibroid   . Gallstones     Past Surgical History:  Procedure Laterality Date  . BALLOON DILATION N/A 11/12/2013   Procedure: BALLOON DILATION WITH STONE EXTRACTION;  Surgeon: Daneil Dolin, MD;  Location: [facility];  Service: Endoscopy;  Laterality: N/A;  . CESAREAN SECTION    . CHOLECYSTECTOMY N/A 11/05/2013   Dr. Arnoldo Morale  . ERCP N/A 11/12/2013   Procedure: ENDOSCOPIC RETROGRADE CHOLANGIOPANCREATOGRAPHY (ERCP), SPHINCTEROTOMY, BALLOON DILATION WITH STONE EXTRACTION;  Surgeon: Daneil Dolin, MD;  Location: [facility];  Service: Endoscopy;  Laterality: N/A;  . PELVIC FRACTURE SURGERY    . SPHINCTEROTOMY N/A 11/12/2013    Procedure: SPHINCTEROTOMY;  Surgeon: Daneil Dolin, MD;  Location: [facility];  Service: Endoscopy;  Laterality: N/A;    Current Outpatient Medications  Medication Sig Dispense Refill  . acetaminophen (TYLENOL) 500 MG tablet Take 500 mg by mouth 2 (two) times daily.    . DULoxetine (CYMBALTA) 30 MG capsule Take by mouth.    . linaclotide (LINZESS) 145 MCG CAPS capsule Take 1 capsule (145 mcg total) by mouth daily before breakfast. 30 capsule 3  . Multiple Vitamin (MULTIVITAMIN) tablet Take 1 tablet by mouth daily. Flinstone vitamin    . pregabalin (LYRICA) 75 MG capsule Take 1 capsule (75 mg total) by mouth 2 (two) times daily. 60 capsule 5  . Vitamin D, Ergocalciferol, (DRISDOL) 50000 units CAPS capsule Take 1 capsule (50,000 Units total) by mouth every 7 (seven) days. x6 months. 12 capsule 2   No current facility-administered medications for this visit.      ALLERGIES: Patient has no known allergies.  Family History  Problem Relation Age of Onset  . Cancer Mother        breast  . Diabetes Mother   . Hypertension Mother   . Breast cancer Mother   . Hyperlipidemia Father   .  Diabetes Father   . Diabetes Brother   . Hyperlipidemia Brother   . Hyperlipidemia Brother   . Colon cancer Neg Hx     Social History   Socioeconomic History  . Marital status: Married    Spouse name: Not on file  . Number of children: 3  . Years of education: Not on file  . Highest education level: Not on file  Occupational History  . Not on file  Social Needs  . Financial resource strain: Not hard at all  . Food insecurity:    Worry: Never true    Inability: Never true  . Transportation needs:    Medical: No    Non-medical: No  Tobacco Use  . Smoking status: Never Smoker  . Smokeless tobacco: Never Used  Substance and Sexual Activity  . Alcohol use: No    Frequency: Never    Comment: rarely  . Drug use: No  . Sexual activity: Not Currently    Birth control/protection: I.U.D.  Lifestyle   . Physical activity:    Days per week: Not on file    Minutes per session: Not on file  . Stress: Not on file  Relationships  . Social connections:    Talks on phone: Not on file    Gets together: Not on file    Attends religious service: Not on file    Active member of club or organization: Not on file    Attends meetings of clubs or organizations: Not on file    Relationship status: Not on file  . Intimate partner violence:    Fear of current or ex partner: Not on file    Emotionally abused: Not on file    Physically abused: Not on file    Forced sexual activity: Not on file  Other Topics Concern  . Not on file  Social History Narrative  . Not on file    Review of Systems  Constitutional: Negative.   HENT: Negative.   Eyes: Negative.   Respiratory: Negative.   Cardiovascular: Negative.   Gastrointestinal: Positive for constipation.  Genitourinary:       Vaginal irritation  Musculoskeletal: Negative.   Skin: Negative.   Neurological: Negative.   Endo/Heme/Allergies: Negative.   Psychiatric/Behavioral: Negative.     PHYSICAL EXAMINATION:    BP 126/84 (BP Location: Left Arm, Patient Position: Sitting, Cuff Size: Normal)   Pulse 84   Ht 5\' 3"  (1.6 m)   Wt 169 lb (76.7 kg)   BMI 29.94 kg/m     General appearance: alert, cooperative and appears stated age  Pelvic: External genitalia:  no lesions              Urethra:  normal appearing urethra with no masses, tenderness or lesions              Bartholins and Skenes: normal                 Vagina: normal appearing vagina with normal color and discharge, no lesions  She has a small second degree cystocele and rectocele and grade one uterine prolapse. The IUD string are seen right at her introitus on the left, strings are 4-5 cm. Trimmed to 2 cm. With Valsalva strings are not seen at the opening of her vagina.               Cervix: no lesions and IUD strings 4-5 cm, trimmed to 2 cm.  Chaperone was  present for exam.  ASSESSMENT IUD check, with her prolapse her strings are at the introitus Prolapse otherwise not bothersome    PLAN IUD strings trimmed F/U in 1 month   An After Visit Summary was printed and given to the patient.

## 2018-09-04 NOTE — Telephone Encounter (Signed)
Left message to call Sharee Pimple, RN at [facility].   Mirena IUD placed 09/02/18

## 2018-09-04 NOTE — Telephone Encounter (Signed)
Patient has an appointment today at 11:00 am for an IUD check.  She states "it looks more like a rash and is wondering if a prescription could be called to her pharmacy for the rash instead of her coming to the office". I told her I am not sure if this is possible and a nurse would return her call. [facility] [address]

## 2018-09-05 DIAGNOSIS — Z981 Arthrodesis status: Secondary | ICD-10-CM | POA: Diagnosis not present

## 2018-09-05 DIAGNOSIS — S329XXK Fracture of unspecified parts of lumbosacral spine and pelvis, subsequent encounter for fracture with nonunion: Secondary | ICD-10-CM | POA: Diagnosis not present

## 2018-09-05 DIAGNOSIS — X58XXXD Exposure to other specified factors, subsequent encounter: Secondary | ICD-10-CM | POA: Diagnosis not present

## 2018-09-05 DIAGNOSIS — S32309D Unspecified fracture of unspecified ilium, subsequent encounter for fracture with routine healing: Secondary | ICD-10-CM | POA: Diagnosis not present

## 2018-09-09 ENCOUNTER — Other Ambulatory Visit: Payer: Self-pay | Admitting: Family

## 2018-09-09 DIAGNOSIS — D649 Anemia, unspecified: Secondary | ICD-10-CM

## 2018-09-10 ENCOUNTER — Inpatient Hospital Stay: Payer: MEDICAID

## 2018-09-10 ENCOUNTER — Telehealth: Payer: Self-pay | Admitting: *Deleted

## 2018-09-10 ENCOUNTER — Inpatient Hospital Stay: Payer: MEDICAID | Admitting: Family

## 2018-09-10 DIAGNOSIS — G4730 Sleep apnea, unspecified: Secondary | ICD-10-CM

## 2018-09-10 NOTE — Telephone Encounter (Signed)
Call placed to patient and patient made aware.   States that she would prefer to use dental device and is agreeable to waiting until COVID 19 precautions are over.

## 2018-09-10 NOTE — Telephone Encounter (Signed)
Received fax from [facility].   Reports that overnight pulse oximetry shows mild obstructive sleep apnea (G47.33).  MD reviews and recommendations for treatment are as follows: 1) CPAP- lower setting (5-20 cm H2O) 2) Dental Device- would need to be seen by dental specialist, but due to [place] 19 precautions appointment will be pushed out approximately 2 months.  Call placed to patient. [facility].

## 2018-09-10 NOTE — Telephone Encounter (Signed)
Traci Schmidt, place referral to the dentist that does the devices, plan for May or see if they can put her on a waiting list

## 2018-09-21 ENCOUNTER — Other Ambulatory Visit: Payer: Self-pay | Admitting: Family Medicine

## 2018-09-26 NOTE — Addendum Note (Signed)
Addended by: Launa Grill on: 09/26/2018 09:49 AM   Modules accepted: Orders

## 2018-09-26 NOTE — Telephone Encounter (Signed)
Referral order placed.

## 2018-09-30 ENCOUNTER — Other Ambulatory Visit: Payer: Self-pay

## 2018-10-01 ENCOUNTER — Telehealth: Payer: Self-pay | Admitting: Obstetrics and Gynecology

## 2018-10-01 ENCOUNTER — Ambulatory Visit: Payer: BLUE CROSS/BLUE SHIELD | Admitting: Obstetrics and Gynecology

## 2018-10-01 NOTE — Telephone Encounter (Signed)
Spoke with patient. Patient is concerned about OV today due to COVID 19 status and her immune system following her major surgery in December. States that she is having daily spotting with her IUD and intermittent cramping that is 5/10. Denies any sharp pain, fever, or chills. Advised will review with Dr.Jertson and return call.

## 2018-10-01 NOTE — Telephone Encounter (Signed)
Spoke with patient. Advised reviewed with Dr.Jertson who recommends she have a PUS to check IUD due to cramping. This way everything can be done in one appointment and she does not have to potentially come to the office more than once. Advised to wear a mask when she comes to the office for her protection. Offered multiple appointments for PUS on 4/16 and patient is unable to come due to another appointment she has scheduled 30 minutes away from the office. Requests to be seen on 10/10/2018. PUS scheduled for 10/10/2018 at 11 am. Patient is agreeable to date and time. Advised if cramping worsens, develops heavy bleeding, sharp pain, fever, chills, will need to be seen in the office earlier for immediate evaluation. Patient verbalizes understanding.   Routing to provider and will close encounter.

## 2018-10-01 NOTE — Progress Notes (Deleted)
GYNECOLOGY  VISIT   HPI: 38 y.o.   Married Asian Not Hispanic or Latino  female   [phone] with No LMP recorded.   here for iud check.  mirena iud inserted 09-02-2018.  GYNECOLOGIC HISTORY: No LMP recorded. Contraception: mirena iud Menopausal hormone therapy: ***        OB History    Gravida  3   Para  3   Term  3   Preterm  0   AB  0   Living  3     SAB  0   TAB  0   Ectopic  0   Multiple  0   Live Births  3              Patient Active Problem List   Diagnosis Date Noted  . Female genital prolapse 09/04/2018  . DUB (dysfunctional uterine bleeding) 07/23/2018  . 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 Acute blood loss anemia 06/25/2018  . Pelvic deformity 04/24/2018  . 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 Lumbosacral radiculopathy at L5 03/15/2018  . Pain in right ankle and joints of right foot 01/21/2018  . Low vitamin D level 01/01/2018  . Obesity (BMI 30-39.9) 06/23/2017    Past Medical History:  Diagnosis Date  . Abnormal uterine bleeding   . Allergy    seasonal  . Fibroid   . Gallstones     Past Surgical History:  Procedure Laterality Date  . BALLOON DILATION N/A 11/12/2013   Procedure: BALLOON DILATION WITH STONE EXTRACTION;  Surgeon: Daneil Dolin, MD;  Location: [facility];  Service: Endoscopy;  Laterality: N/A;  . CESAREAN SECTION    . CHOLECYSTECTOMY N/A 11/05/2013   Dr. Arnoldo Morale  . ERCP N/A 11/12/2013   Procedure: ENDOSCOPIC RETROGRADE CHOLANGIOPANCREATOGRAPHY (ERCP), SPHINCTEROTOMY, BALLOON DILATION WITH STONE EXTRACTION;  Surgeon: Daneil Dolin, MD;  Location: [facility];  Service: Endoscopy;  Laterality: N/A;  . PELVIC FRACTURE SURGERY    . SPHINCTEROTOMY N/A 11/12/2013   Procedure: SPHINCTEROTOMY;  Surgeon: Daneil Dolin, MD;  Location: [facility];  Service: Endoscopy;  Laterality: N/A;    Current Outpatient Medications  Medication Sig Dispense Refill  . acetaminophen (TYLENOL) 500 MG tablet Take 500 mg  by mouth 2 (two) times daily.    . DULoxetine (CYMBALTA) 30 MG capsule Take by mouth.    . linaclotide (LINZESS) 145 MCG CAPS capsule Take 1 capsule (145 mcg total) by mouth daily before breakfast. 30 capsule 3  . Multiple Vitamin (MULTIVITAMIN) tablet Take 1 tablet by mouth daily. Flinstone vitamin    . pregabalin (LYRICA) 75 MG capsule Take 1 capsule (75 mg total) by mouth 2 (two) times daily. 60 capsule 5  . Vitamin D, Ergocalciferol, (DRISDOL) 50000 units CAPS capsule Take 1 capsule (50,000 Units total) by mouth every 7 (seven) days. x6 months. 12 capsule 2   No current facility-administered medications for this visit.      ALLERGIES: Patient has no known allergies.  Family History  Problem Relation Age of Onset  . Cancer Mother        breast  . Diabetes Mother   . Hypertension Mother   . Breast cancer Mother   . Hyperlipidemia Father   . Diabetes Father   . Diabetes Brother   . Hyperlipidemia Brother   . Hyperlipidemia Brother   . Colon cancer Neg Hx     Social History   Socioeconomic History  . Marital status: Married  Spouse name: Not on file  . Number of children: 3  . Years of education: Not on file  . Highest education level: Not on file  Occupational History  . Not on file  Social Needs  . Financial resource strain: Not hard at all  . Food insecurity:    Worry: Never true    Inability: Never true  . Transportation needs:    Medical: No    Non-medical: No  Tobacco Use  . Smoking status: Never Smoker  . Smokeless tobacco: Never Used  Substance and Sexual Activity  . Alcohol use: No    Frequency: Never    Comment: rarely  . Drug use: No  . Sexual activity: Not Currently    Birth control/protection: I.U.D.  Lifestyle  . Physical activity:    Days per week: Not on file    Minutes per session: Not on file  . Stress: Not on file  Relationships  . Social connections:    Talks on phone: Not on file    Gets together: Not on file    Attends religious  service: Not on file    Active member of club or organization: Not on file    Attends meetings of clubs or organizations: Not on file    Relationship status: Not on file  . Intimate partner violence:    Fear of current or ex partner: Not on file    Emotionally abused: Not on file    Physically abused: Not on file    Forced sexual activity: Not on file  Other Topics Concern  . Not on file  Social History Narrative  . Not on file    ROS  PHYSICAL EXAMINATION:    There were no vitals taken for this visit.    General appearance: alert, cooperative and appears stated age Neck: no adenopathy, supple, symmetrical, trachea midline and thyroid {CHL AMB PHY EX THYROID NORM DEFAULT:[phone]::"normal to inspection and palpation"} Breasts: {Exam; breast:13139::"normal appearance, no masses or tenderness"} Abdomen: soft, non-tender; non distended, no masses,  no organomegaly  Pelvic: External genitalia:  no lesions              Urethra:  normal appearing urethra with no masses, tenderness or lesions              Bartholins and Skenes: normal                 Vagina: normal appearing vagina with normal color and discharge, no lesions              Cervix: {CHL AMB PHY EX CERVIX NORM DEFAULT:[phone]::"no lesions"}              Bimanual Exam:  Uterus:  {CHL AMB PHY EX UTERUS NORM [phone]::"normal size, contour, position, consistency, mobility, non-tender"}              Adnexa: {CHL AMB PHY EX ADNEXA NO MASS DEFAULT:[phone]::"no mass, fullness, tenderness"}              Rectovaginal: {yes no:[id number]}.  Confirms.              Anus:  normal sphincter tone, no lesions  Chaperone was present for exam.  ASSESSMENT     PLAN    An After Visit Summary was printed and given to the patient.  *** minutes face to face time of which over 50% was spent in counseling.

## 2018-10-01 NOTE — Telephone Encounter (Signed)
Patient would like to know if her IUD check can be done virtually.

## 2018-10-02 ENCOUNTER — Ambulatory Visit: Payer: BLUE CROSS/BLUE SHIELD | Admitting: Obstetrics and Gynecology

## 2018-10-04 ENCOUNTER — Other Ambulatory Visit: Payer: Self-pay | Admitting: *Deleted

## 2018-10-04 DIAGNOSIS — Z975 Presence of (intrauterine) contraceptive device: Principal | ICD-10-CM

## 2018-10-04 DIAGNOSIS — N921 Excessive and frequent menstruation with irregular cycle: Secondary | ICD-10-CM

## 2018-10-10 ENCOUNTER — Other Ambulatory Visit: Payer: Self-pay

## 2018-10-10 ENCOUNTER — Ambulatory Visit: Payer: BLUE CROSS/BLUE SHIELD | Admitting: Obstetrics and Gynecology

## 2018-10-10 ENCOUNTER — Ambulatory Visit (INDEPENDENT_AMBULATORY_CARE_PROVIDER_SITE_OTHER): Payer: BLUE CROSS/BLUE SHIELD

## 2018-10-10 ENCOUNTER — Encounter: Payer: Self-pay | Admitting: Obstetrics and Gynecology

## 2018-10-10 VITALS — BP 110/64 | HR 68 | Temp 98.8°F | Resp 16 | Wt 170.0 lb

## 2018-10-10 DIAGNOSIS — T8332XA Displacement of intrauterine contraceptive device, initial encounter: Secondary | ICD-10-CM | POA: Diagnosis not present

## 2018-10-10 DIAGNOSIS — Z975 Presence of (intrauterine) contraceptive device: Secondary | ICD-10-CM

## 2018-10-10 DIAGNOSIS — N921 Excessive and frequent menstruation with irregular cycle: Secondary | ICD-10-CM

## 2018-10-10 DIAGNOSIS — D500 Iron deficiency anemia secondary to blood loss (chronic): Secondary | ICD-10-CM | POA: Diagnosis not present

## 2018-10-10 DIAGNOSIS — N939 Abnormal uterine and vaginal bleeding, unspecified: Secondary | ICD-10-CM

## 2018-10-10 DIAGNOSIS — Z3009 Encounter for other general counseling and advice on contraception: Secondary | ICD-10-CM | POA: Diagnosis not present

## 2018-10-10 NOTE — Progress Notes (Signed)
GYNECOLOGY  VISIT   HPI: 38 y.o.   Married Asian Not Hispanic or Latino  female   [phone] with No LMP recorded.   here for Ultrasound. The patient is having pain, h/o IUD insertion a few months ago. She feels the string are longer. She has a h/o AUB leading to anemia, prior u/s c/w adenomyosis. She is not currently a candidate for OCP's (imobilization).  She has some spotting with the IUD, but no longer having heavy bleeding. Some pain in the LLQ/pelvis in the last few weeks.  She has a h/o a small grade 2 cystocele, small grade 2 rectocele and grade one uterine prolapse. H/O C/S x 3. She want's a hysterectomy and repair of her prolapse.  GYNECOLOGIC HISTORY: No LMP recorded. Contraception:none Menopausal hormone therapy: none        OB History    Gravida  3   Para  3   Term  3   Preterm  0   AB  0   Living  3     SAB  0   TAB  0   Ectopic  0   Multiple  0   Live Births  3              Patient Active Problem List   Diagnosis Date Noted  . Female genital prolapse 09/04/2018  . DUB (dysfunctional uterine bleeding) 07/23/2018  . 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 Acute blood loss anemia 06/25/2018  . Pelvic deformity 04/24/2018  . 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 Lumbosacral radiculopathy at L5 03/15/2018  . Pain in right ankle and joints of right foot 01/21/2018  . Low vitamin D level 01/01/2018  . Obesity (BMI 30-39.9) 06/23/2017    Past Medical History:  Diagnosis Date  . Abnormal uterine bleeding   . Allergy    seasonal  . Fibroid   . Gallstones     Past Surgical History:  Procedure Laterality Date  . BALLOON DILATION N/A 11/12/2013   Procedure: BALLOON DILATION WITH STONE EXTRACTION;  Surgeon: Daneil Dolin, MD;  Location: [facility];  Service: Endoscopy;  Laterality: N/A;  . CESAREAN SECTION    . CHOLECYSTECTOMY N/A 11/05/2013   Dr. Arnoldo Morale  . ERCP N/A 11/12/2013   Procedure: ENDOSCOPIC RETROGRADE  CHOLANGIOPANCREATOGRAPHY (ERCP), SPHINCTEROTOMY, BALLOON DILATION WITH STONE EXTRACTION;  Surgeon: Daneil Dolin, MD;  Location: [facility];  Service: Endoscopy;  Laterality: N/A;  . PELVIC FRACTURE SURGERY    . SPHINCTEROTOMY N/A 11/12/2013   Procedure: SPHINCTEROTOMY;  Surgeon: Daneil Dolin, MD;  Location: [facility];  Service: Endoscopy;  Laterality: N/A;    Current Outpatient Medications  Medication Sig Dispense Refill  . acetaminophen (TYLENOL) 500 MG tablet Take 500 mg by mouth 2 (two) times daily.    . cyclobenzaprine (FLEXERIL) 5 MG tablet TK 1 T PO TID PRN FOR MUSCLE SPASMS FOR UP TO 20 DAYS    . HYDROcodone-acetaminophen (NORCO/VICODIN) 5-325 MG tablet Take by mouth.    . linaclotide (LINZESS) 145 MCG CAPS capsule Take 1 capsule (145 mcg total) by mouth daily before breakfast. 30 capsule 3  . Multiple Vitamin (MULTIVITAMIN) tablet Take 1 tablet by mouth daily. Flinstone vitamin    . pregabalin (LYRICA) 75 MG capsule Take 1 capsule (75 mg total) by mouth 2 (two) times daily. 60 capsule 5  . Vitamin D, Ergocalciferol, (DRISDOL) 50000 units CAPS capsule Take 1 capsule (50,000 Units total) by mouth every 7 (seven) days.  x6 months. 12 capsule 2   No current facility-administered medications for this visit.      ALLERGIES: Patient has no known allergies.  Family History  Problem Relation Age of Onset  . Cancer Mother        breast  . Diabetes Mother   . Hypertension Mother   . Breast cancer Mother   . Hyperlipidemia Father   . Diabetes Father   . Diabetes Brother   . Hyperlipidemia Brother   . Hyperlipidemia Brother   . Colon cancer Neg Hx     Social History   Socioeconomic History  . Marital status: Married    Spouse name: Not on file  . Number of children: 3  . Years of education: Not on file  . Highest education level: Not on file  Occupational History  . Not on file  Social Needs  . Financial resource strain: Not hard at all  . Food insecurity:    Worry: Never true     Inability: Never true  . Transportation needs:    Medical: No    Non-medical: No  Tobacco Use  . Smoking status: Never Smoker  . Smokeless tobacco: Never Used  Substance and Sexual Activity  . Alcohol use: No    Frequency: Never  . Drug use: No  . Sexual activity: Not Currently    Birth control/protection: I.U.D.  Lifestyle  . Physical activity:    Days per week: Not on file    Minutes per session: Not on file  . Stress: Not on file  Relationships  . Social connections:    Talks on phone: Not on file    Gets together: Not on file    Attends religious service: Not on file    Active member of club or organization: Not on file    Attends meetings of clubs or organizations: Not on file    Relationship status: Not on file  . Intimate partner violence:    Fear of current or ex partner: Not on file    Emotionally abused: Not on file    Physically abused: Not on file    Forced sexual activity: Not on file  Other Topics Concern  . Not on file  Social History Narrative  . Not on file    Review of Systems  Constitutional: Negative.   HENT: Negative.   Eyes: Negative.   Respiratory: Negative.   Cardiovascular: Negative.   Gastrointestinal: Positive for constipation.       Bloating  Genitourinary:       Unscheduled spotting/bleeding  Musculoskeletal: Negative.   Skin: Negative.   Neurological: Negative.   Endo/Heme/Allergies: Negative.   Psychiatric/Behavioral: Negative.     PHYSICAL EXAMINATION:    BP 110/64   Pulse 68   Temp 98.8 F (37.1 C) (Oral)   Resp 16   Wt 170 lb (77.1 kg)   BMI 30.11 kg/m     General appearance: alert, cooperative and appears stated age  Ultrasound images reviewed with the patient  ASSESSMENT Malpositioned IUD, partially expulsed, in her LUS Her bleeding has gone to spotting with the IUD Discussed other options for contraception and to control her AUB (has been anemic)    PLAN She is not currently a candidate for OCP's  (immobilization) We discussed the option of removing this IUD and placing a new mirena with ultrasound guidance Discussed trial of the mini-pill, use of daily progesterone (to control bleeding, but not contraception), depo-provera and depo-lupron. Ultimately she desires hysterectomy. Given current  covid 19 crisis cases are not being scheduled. She is going to have to have repair of the plate that is holing her pelvis together (cracked). This will be done at [facility]. She is asking about the possibility of doing her hysterectomy at the same time.  She will need to discuss the option of doing both surgeries at the same time. If he says yes, she will need to establish care with GYN at [facility] If not, would plan TLH/BS, repair of cystocele and rectocele in the future. The IUD could put off her need for surgery for a long time potentially.  She will return for IUD removal and reinsertion under ultrasound guidance.    An After Visit Summary was printed and given to the patient.  ~15 minutes face to face time of which over 50% was spent in counseling.

## 2018-10-11 LAB — CBC
Hematocrit: 32.8 % — ABNORMAL LOW (ref 34.0–46.6)
Hemoglobin: 10.2 g/dL — ABNORMAL LOW (ref 11.1–15.9)
MCH: 23 pg — ABNORMAL LOW (ref 26.6–33.0)
MCHC: 31.1 g/dL — ABNORMAL LOW (ref 31.5–35.7)
MCV: 74 fL — ABNORMAL LOW (ref 79–97)
Platelets: 490 10*3/uL — ABNORMAL HIGH (ref 150–450)
RBC: 4.43 x10E6/uL (ref 3.77–5.28)
RDW: 14 % (ref 11.7–15.4)
WBC: 11.3 10*3/uL — ABNORMAL HIGH (ref 3.4–10.8)

## 2018-10-11 LAB — FERRITIN: Ferritin: 10 ng/mL — ABNORMAL LOW (ref 15–150)

## 2018-10-17 ENCOUNTER — Other Ambulatory Visit: Payer: Self-pay

## 2018-10-17 ENCOUNTER — Ambulatory Visit (INDEPENDENT_AMBULATORY_CARE_PROVIDER_SITE_OTHER): Payer: BLUE CROSS/BLUE SHIELD

## 2018-10-17 ENCOUNTER — Ambulatory Visit (INDEPENDENT_AMBULATORY_CARE_PROVIDER_SITE_OTHER): Payer: BLUE CROSS/BLUE SHIELD | Admitting: Obstetrics and Gynecology

## 2018-10-17 ENCOUNTER — Encounter: Payer: Self-pay | Admitting: Obstetrics and Gynecology

## 2018-10-17 VITALS — HR 70 | Temp 98.2°F | Resp 16

## 2018-10-17 DIAGNOSIS — T8332XA Displacement of intrauterine contraceptive device, initial encounter: Secondary | ICD-10-CM

## 2018-10-17 DIAGNOSIS — Z3009 Encounter for other general counseling and advice on contraception: Secondary | ICD-10-CM

## 2018-10-17 DIAGNOSIS — N939 Abnormal uterine and vaginal bleeding, unspecified: Secondary | ICD-10-CM

## 2018-10-17 DIAGNOSIS — Z30433 Encounter for removal and reinsertion of intrauterine contraceptive device: Secondary | ICD-10-CM

## 2018-10-17 NOTE — Progress Notes (Signed)
GYNECOLOGY  VISIT   HPI: 38 y.o.   Married Asian Not Hispanic or Latino  female   [phone] with No LMP recorded.   here for IUD removal and reinsertion under ultrasound guidance. She presented with a partially expulsed IUD last week. She is going to have repeat surgery on her pelvis for cracked hardware.   GYNECOLOGIC HISTORY: No LMP recorded. Contraception:IUD Menopausal hormone therapy: na        OB History    Gravida  3   Para  3   Term  3   Preterm  0   AB  0   Living  3     SAB  0   TAB  0   Ectopic  0   Multiple  0   Live Births  3              Patient Active Problem List   Diagnosis Date Noted  . Female genital prolapse 09/04/2018  . DUB (dysfunctional uterine bleeding) 07/23/2018  . 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 Acute blood loss anemia 06/25/2018  . Pelvic deformity 04/24/2018  . 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 Lumbosacral radiculopathy at L5 03/15/2018  . Pain in right ankle and joints of right foot 01/21/2018  . Low vitamin D level 01/01/2018  . Obesity (BMI 30-39.9) 06/23/2017    Past Medical History:  Diagnosis Date  . Abnormal uterine bleeding   . Allergy    seasonal  . Fibroid   . Gallstones     Past Surgical History:  Procedure Laterality Date  . BALLOON DILATION N/A 11/12/2013   Procedure: BALLOON DILATION WITH STONE EXTRACTION;  Surgeon: Daneil Dolin, MD;  Location: [facility];  Service: Endoscopy;  Laterality: N/A;  . CESAREAN SECTION    . CHOLECYSTECTOMY N/A 11/05/2013   Dr. Arnoldo Morale  . ERCP N/A 11/12/2013   Procedure: ENDOSCOPIC RETROGRADE CHOLANGIOPANCREATOGRAPHY (ERCP), SPHINCTEROTOMY, BALLOON DILATION WITH STONE EXTRACTION;  Surgeon: Daneil Dolin, MD;  Location: [facility];  Service: Endoscopy;  Laterality: N/A;  . PELVIC FRACTURE SURGERY    . SPHINCTEROTOMY N/A 11/12/2013   Procedure: SPHINCTEROTOMY;  Surgeon: Daneil Dolin, MD;  Location: [facility];  Service: Endoscopy;   Laterality: N/A;    Current Outpatient Medications  Medication Sig Dispense Refill  . acetaminophen (TYLENOL) 500 MG tablet Take 500 mg by mouth 2 (two) times daily.    . cyclobenzaprine (FLEXERIL) 5 MG tablet TK 1 T PO TID PRN FOR MUSCLE SPASMS FOR UP TO 20 DAYS    . HYDROcodone-acetaminophen (NORCO/VICODIN) 5-325 MG tablet Take by mouth.    . linaclotide (LINZESS) 145 MCG CAPS capsule Take 1 capsule (145 mcg total) by mouth daily before breakfast. 30 capsule 3  . Multiple Vitamin (MULTIVITAMIN) tablet Take 1 tablet by mouth daily. Flinstone vitamin    . pregabalin (LYRICA) 75 MG capsule Take 1 capsule (75 mg total) by mouth 2 (two) times daily. 60 capsule 5  . Vitamin D, Ergocalciferol, (DRISDOL) 50000 units CAPS capsule Take 1 capsule (50,000 Units total) by mouth every 7 (seven) days. x6 months. 12 capsule 2   No current facility-administered medications for this visit.      ALLERGIES: Patient has no known allergies.  Family History  Problem Relation Age of Onset  . Cancer Mother        breast  . Diabetes Mother   . Hypertension Mother   . Breast cancer Mother   . Hyperlipidemia Father   .  Diabetes Father   . Diabetes Brother   . Hyperlipidemia Brother   . Hyperlipidemia Brother   . Colon cancer Neg Hx     Social History   Socioeconomic History  . Marital status: Married    Spouse name: Not on file  . Number of children: 3  . Years of education: Not on file  . Highest education level: Not on file  Occupational History  . Not on file  Social Needs  . Financial resource strain: Not hard at all  . Food insecurity:    Worry: Never true    Inability: Never true  . Transportation needs:    Medical: No    Non-medical: No  Tobacco Use  . Smoking status: Never Smoker  . Smokeless tobacco: Never Used  Substance and Sexual Activity  . Alcohol use: No    Frequency: Never  . Drug use: No  . Sexual activity: Not Currently    Birth control/protection: I.U.D.   Lifestyle  . Physical activity:    Days per week: Not on file    Minutes per session: Not on file  . Stress: Not on file  Relationships  . Social connections:    Talks on phone: Not on file    Gets together: Not on file    Attends religious service: Not on file    Active member of club or organization: Not on file    Attends meetings of clubs or organizations: Not on file    Relationship status: Not on file  . Intimate partner violence:    Fear of current or ex partner: Not on file    Emotionally abused: Not on file    Physically abused: Not on file    Forced sexual activity: Not on file  Other Topics Concern  . Not on file  Social History Narrative  . Not on file    ROS  PHYSICAL EXAMINATION:    There were no vitals taken for this visit.    General appearance: alert, cooperative and appears stated age  Pelvic: External genitalia:  no lesions              Urethra:  normal appearing urethra with no masses, tenderness or lesions              Bartholins and Skenes: normal                 Vagina: normal appearing vagina with a small grade 2 cystocele and small grade 2 rectocele              Cervix: no lesions and IUD string 3 cm, removed with ringed forceps   The risks of the mirena IUD were reviewed with the patient, including infection, abnormal bleeding and uterine perfortion. Consent was signed.  A speculum was placed in the vagina, the old IUD was removed with ringed forceps.  The cervix was cleansed with betadine. A tenaculum was placed on the cervix, the uterus sounded to 8-9 cm. The cervix was dilated to a 5 hagar dilator  The mirena IUD was inserted without difficulty. The string were cut to 2 cm. The tenaculum was removed. Moderate bleeding from the tenaculum site was stopped with pressure.   The patient tolerated the procedure well.                 Chaperone was present for exam.  ASSESSMENT Partially expulsed IUD    PLAN IUD removed and reinserted F/U  in one month  An After Visit Summary was printed and given to the patient.

## 2018-10-17 NOTE — Patient Instructions (Signed)

## 2018-10-18 DIAGNOSIS — S329XXK Fracture of unspecified parts of lumbosacral spine and pelvis, subsequent encounter for fracture with nonunion: Secondary | ICD-10-CM | POA: Diagnosis not present

## 2018-10-18 DIAGNOSIS — X58XXXD Exposure to other specified factors, subsequent encounter: Secondary | ICD-10-CM | POA: Diagnosis not present

## 2018-10-18 DIAGNOSIS — Z9889 Other specified postprocedural states: Secondary | ICD-10-CM | POA: Diagnosis not present

## 2018-11-22 ENCOUNTER — Ambulatory Visit (INDEPENDENT_AMBULATORY_CARE_PROVIDER_SITE_OTHER): Payer: BC Managed Care – PPO | Admitting: Family Medicine

## 2018-11-22 ENCOUNTER — Ambulatory Visit: Payer: BLUE CROSS/BLUE SHIELD | Admitting: Family Medicine

## 2018-11-22 ENCOUNTER — Other Ambulatory Visit: Payer: Self-pay

## 2018-11-22 VITALS — BP 102/66 | HR 90 | Temp 98.6°F | Resp 18

## 2018-11-22 DIAGNOSIS — L301 Dyshidrosis [pompholyx]: Secondary | ICD-10-CM

## 2018-11-22 DIAGNOSIS — N63 Unspecified lump in unspecified breast: Secondary | ICD-10-CM | POA: Diagnosis not present

## 2018-11-22 DIAGNOSIS — R7989 Other specified abnormal findings of blood chemistry: Secondary | ICD-10-CM | POA: Diagnosis not present

## 2018-11-22 DIAGNOSIS — E559 Vitamin D deficiency, unspecified: Secondary | ICD-10-CM | POA: Diagnosis not present

## 2018-11-22 MED ORDER — CLOBETASOL PROPIONATE 0.05 % EX CREA: 1.0000 "application " | TOPICAL_CREAM | Freq: Two times a day (BID) | CUTANEOUS | 1 refills | Status: DC

## 2018-11-22 NOTE — Progress Notes (Signed)
   Subjective:    Patient ID: Traci Schmidt, female    DOB: 1981-02-07, 38 y.o.   MRN: [id number]  Patient presents for Follow-up (4 month)   Noticed little blistering lesion on left thumb, irritated, scabs, itching more present for about 2 months , also had when she was younger     Vitamin D- due for repeat labs   Still awaiting her surgery  To fix plate in her pelvis, she is mostly wheelchair bound, takes very few steps with walker  OSA mild- wants to wait for dental evaluation   Nelson Chimes- has IUD Mirana had 2 placed , bleeding has stopped   Linzess needed every 3-4 days   Feels a knot in her left breast since she has had mirena, but has family history of breast cancer as well,     Review Of Systems:  GEN- denies fatigue, fever, weight loss,weakness, recent illness HEENT- denies eye drainage, change in vision, nasal discharge, CVS- denies chest pain, palpitations RESP- denies SOB, cough, wheeze ABD- denies N/V, change in stools, abd pain GU- denies dysuria, hematuria, dribbling, incontinence MSK- + joint pain, muscle aches, injury Neuro- denies headache, dizziness, syncope, seizure activity       Objective:    BP 102/66   Pulse 90   Temp 98.6 F (37 C)   Resp 18   SpO2 99%  GEN- NAD, alert and oriented x3 HEENT- PERRL, EOMI, non injected sclera, pink conjunctiva, MMM, oropharynx clear Neck- Supple, no thyromegaly Breast- normal symmetry, no nipple inversion,no nipple drainage, left  breast 2 oclock position nodule palpated, mild TTP  Nodes- no axillary nodes CVS- RRR, no murmur RESP-CTAB Skin- Left  hand- thumb small blisters, ecematous rash on, index fingers, between 2-3rd  EXT- No edema Pulses- Radial, DP- 2+        Assessment & Plan:      Problem List Items Addressed This Visit      Unprioritized   Low vitamin D level - Primary   Relevant Orders   Vitamin D, 25-hydroxy (Completed)   Basic metabolic panel (Completed)    Other Visit Diagnoses     Breast mass       possible cyst since hormonal change, but mother has had breast cancer, mammogram to be done   Relevant Orders   MM DIAG BREAST TOMO BILATERAL   US BREAST COMPLETE UNI LEFT INC AXILLA   Dyshidrotic eczema       topical clobetasol,avoid sanitizer or chemicals      Note: This dictation was prepared with Dragon dictation along with smaller Company secretary. Any transcriptional errors that result from this process are unintentional.

## 2018-11-22 NOTE — Patient Instructions (Addendum)
We will wait until fall for dental evaluation  We will call with vitamin D  Mammogram to be done in [place]  F/U 6 months For physical

## 2018-11-23 LAB — BASIC METABOLIC PANEL
BUN: 7 mg/dL (ref 7–25)
CO2: 24 mmol/L (ref 20–32)
Calcium: 9.5 mg/dL (ref 8.6–10.2)
Chloride: 106 mmol/L (ref 98–110)
Creat: 0.55 mg/dL (ref 0.50–1.10)
Glucose, Bld: 123 mg/dL — ABNORMAL HIGH (ref 65–99)
Potassium: 4.6 mmol/L (ref 3.5–5.3)
Sodium: 140 mmol/L (ref 135–146)

## 2018-11-23 LAB — VITAMIN D 25 HYDROXY (VIT D DEFICIENCY, FRACTURES): Vit D, 25-Hydroxy: 26 ng/mL — ABNORMAL LOW (ref 30–100)

## 2018-11-24 ENCOUNTER — Encounter: Payer: Self-pay | Admitting: Family Medicine

## 2018-11-26 ENCOUNTER — Other Ambulatory Visit: Payer: Self-pay | Admitting: *Deleted

## 2018-11-26 MED ORDER — CHOLECALCIFEROL 50 MCG (2000 UT) PO TABS: 2000.0000 [IU] | ORAL_TABLET | Freq: Every day | ORAL | Status: DC

## 2018-11-27 ENCOUNTER — Telehealth: Payer: Self-pay

## 2018-11-27 NOTE — Telephone Encounter (Signed)
Salvadore Dom, MD  Antwanette Wesche, Harley Hallmark, RN        The patient has already been seen at hematology, was this an iron transfusion? Is her surgery scheduled?  Thanks,  Sharee Pimple   Previous Messages    ----- Message -----  From: Donita Brooks D  Sent: 11/20/2018 11:23 AM EDT  To: Salvadore Dom, MD   Patient cancelled appt for Hematology Consult. Please advise.   Thanks  Tonya      Left message to call Baudelio Karnes at [phone].

## 2018-11-28 NOTE — Telephone Encounter (Signed)
Spoke with patient regarding referral to [facility]. Patient states that she cancelled her appointment due to [facility]. Will call to reschedule this appointment. Patient is asking if she may proceed with a hysterectomy with Dr.Jertson as her other surgery with [facility] has been delayed. Patient is unsure when this will be rescheduled.

## 2018-11-28 NOTE — Telephone Encounter (Signed)
Patient is returning call to Kaitlyn.  

## 2018-11-28 NOTE — Telephone Encounter (Signed)
Given her immobilization, I don't think it makes sense to do her hysterectomy prior to repair of her pelvic fracture, it will increase her risk of blood clots. In addition to a hysterectomy she needs repair of her cystocele and rectocele (will need vault suspension, grade one vault prolapse). H/O C/S x 3. It will not be a short surgery.  Please check on how she is doing since the new IUD was inserted?

## 2018-12-03 NOTE — Telephone Encounter (Signed)
Left message to call Kaitlyn at [phone]. 

## 2018-12-05 ENCOUNTER — Ambulatory Visit
Admission: RE | Admit: 2018-12-05 | Discharge: 2018-12-05 | Disposition: A | Discharge status: Home / Self Care | Payer: BC Managed Care – PPO | Source: Ambulatory Visit | Attending: Family Medicine | Admitting: Family Medicine

## 2018-12-05 ENCOUNTER — Ambulatory Visit
Admission: RE | Admit: 2018-12-05 | Discharge: 2018-12-05 | Disposition: A | Discharge status: Home / Self Care | Payer: BLUE CROSS/BLUE SHIELD | Source: Ambulatory Visit | Attending: Family Medicine | Admitting: Family Medicine

## 2018-12-05 ENCOUNTER — Other Ambulatory Visit: Payer: Self-pay

## 2018-12-05 DIAGNOSIS — N63 Unspecified lump in unspecified breast: Secondary | ICD-10-CM

## 2018-12-05 DIAGNOSIS — N6324 Unspecified lump in the left breast, lower inner quadrant: Secondary | ICD-10-CM | POA: Diagnosis not present

## 2018-12-05 DIAGNOSIS — R928 Other abnormal and inconclusive findings on diagnostic imaging of breast: Secondary | ICD-10-CM | POA: Diagnosis not present

## 2018-12-09 NOTE — Telephone Encounter (Signed)
Spoke with patient. Advised of message as seen below from [place]. Patient states that she has her other surgery scheduled for 12/13/2018 at this time. Patient states that she is having light spotting with her IUD at this time. Denies heavy bleeding or any other symptoms at this time. States that she would like to recover from her other surgery and then plan to have a hysterectomy.   Routing to provider and will close encounter.

## 2018-12-10 DIAGNOSIS — S3210XK Unspecified fracture of sacrum, subsequent encounter for fracture with nonunion: Secondary | ICD-10-CM | POA: Diagnosis not present

## 2018-12-10 DIAGNOSIS — M5416 Radiculopathy, lumbar region: Secondary | ICD-10-CM | POA: Diagnosis not present

## 2018-12-10 DIAGNOSIS — T84038A Mechanical loosening of other internal prosthetic joint, initial encounter: Secondary | ICD-10-CM | POA: Diagnosis not present

## 2018-12-10 DIAGNOSIS — R000 Tachycardia, unspecified: Secondary | ICD-10-CM | POA: Diagnosis not present

## 2018-12-10 DIAGNOSIS — I517 Cardiomegaly: Secondary | ICD-10-CM | POA: Diagnosis not present

## 2018-12-10 DIAGNOSIS — T84018A Broken internal joint prosthesis, other site, initial encounter: Secondary | ICD-10-CM | POA: Diagnosis not present

## 2018-12-10 DIAGNOSIS — S32591K Other specified fracture of right pubis, subsequent encounter for fracture with nonunion: Secondary | ICD-10-CM | POA: Diagnosis not present

## 2018-12-10 DIAGNOSIS — Y831 Surgical operation with implant of artificial internal device as the cause of abnormal reaction of the patient, or of later complication, without mention of misadventure at the time of the procedure: Secondary | ICD-10-CM | POA: Diagnosis not present

## 2018-12-10 DIAGNOSIS — K5900 Constipation, unspecified: Secondary | ICD-10-CM | POA: Diagnosis not present

## 2018-12-10 DIAGNOSIS — S32811K Multiple fractures of pelvis with unstable disruption of pelvic ring, subsequent encounter for fracture with nonunion: Secondary | ICD-10-CM | POA: Diagnosis not present

## 2018-12-10 DIAGNOSIS — G8929 Other chronic pain: Secondary | ICD-10-CM | POA: Diagnosis not present

## 2018-12-10 DIAGNOSIS — M4326 Fusion of spine, lumbar region: Secondary | ICD-10-CM | POA: Diagnosis not present

## 2018-12-10 DIAGNOSIS — Z1159 Encounter for screening for other viral diseases: Secondary | ICD-10-CM | POA: Diagnosis not present

## 2018-12-10 DIAGNOSIS — S329XXK Fracture of unspecified parts of lumbosacral spine and pelvis, subsequent encounter for fracture with nonunion: Secondary | ICD-10-CM | POA: Diagnosis not present

## 2018-12-10 DIAGNOSIS — M532X8 Spinal instabilities, sacral and sacrococcygeal region: Secondary | ICD-10-CM | POA: Diagnosis not present

## 2018-12-10 DIAGNOSIS — D508 Other iron deficiency anemias: Secondary | ICD-10-CM | POA: Diagnosis not present

## 2018-12-10 DIAGNOSIS — X58XXXD Exposure to other specified factors, subsequent encounter: Secondary | ICD-10-CM | POA: Diagnosis not present

## 2018-12-10 DIAGNOSIS — T8489XA Other specified complication of internal orthopedic prosthetic devices, implants and grafts, initial encounter: Secondary | ICD-10-CM | POA: Diagnosis not present

## 2018-12-10 DIAGNOSIS — S329XXA Fracture of unspecified parts of lumbosacral spine and pelvis, initial encounter for closed fracture: Secondary | ICD-10-CM | POA: Diagnosis not present

## 2018-12-10 DIAGNOSIS — Z9049 Acquired absence of other specified parts of digestive tract: Secondary | ICD-10-CM | POA: Diagnosis not present

## 2018-12-10 DIAGNOSIS — Z981 Arthrodesis status: Secondary | ICD-10-CM | POA: Diagnosis not present

## 2018-12-10 DIAGNOSIS — S32592K Other specified fracture of left pubis, subsequent encounter for fracture with nonunion: Secondary | ICD-10-CM | POA: Diagnosis not present

## 2018-12-10 DIAGNOSIS — K660 Peritoneal adhesions (postprocedural) (postinfection): Secondary | ICD-10-CM | POA: Diagnosis not present

## 2018-12-13 DIAGNOSIS — S32592K Other specified fracture of left pubis, subsequent encounter for fracture with nonunion: Secondary | ICD-10-CM | POA: Diagnosis not present

## 2018-12-13 DIAGNOSIS — S3210XK Unspecified fracture of sacrum, subsequent encounter for fracture with nonunion: Secondary | ICD-10-CM | POA: Diagnosis not present

## 2018-12-13 DIAGNOSIS — S32591K Other specified fracture of right pubis, subsequent encounter for fracture with nonunion: Secondary | ICD-10-CM | POA: Diagnosis not present

## 2018-12-13 DIAGNOSIS — T84038A Mechanical loosening of other internal prosthetic joint, initial encounter: Secondary | ICD-10-CM | POA: Diagnosis not present

## 2018-12-31 DIAGNOSIS — M545 Low back pain: Secondary | ICD-10-CM | POA: Diagnosis not present

## 2018-12-31 DIAGNOSIS — Z4889 Encounter for other specified surgical aftercare: Secondary | ICD-10-CM | POA: Diagnosis not present

## 2018-12-31 DIAGNOSIS — Z981 Arthrodesis status: Secondary | ICD-10-CM | POA: Diagnosis not present

## 2018-12-31 DIAGNOSIS — M4326 Fusion of spine, lumbar region: Secondary | ICD-10-CM | POA: Diagnosis not present

## 2018-12-31 DIAGNOSIS — Z4802 Encounter for removal of sutures: Secondary | ICD-10-CM | POA: Diagnosis not present

## 2019-01-07 DIAGNOSIS — Z79899 Other long term (current) drug therapy: Secondary | ICD-10-CM | POA: Diagnosis not present

## 2019-01-07 DIAGNOSIS — X58XXXD Exposure to other specified factors, subsequent encounter: Secondary | ICD-10-CM | POA: Diagnosis not present

## 2019-01-07 DIAGNOSIS — S329XXK Fracture of unspecified parts of lumbosacral spine and pelvis, subsequent encounter for fracture with nonunion: Secondary | ICD-10-CM | POA: Diagnosis not present

## 2019-01-07 DIAGNOSIS — Z981 Arthrodesis status: Secondary | ICD-10-CM | POA: Diagnosis not present

## 2019-01-07 DIAGNOSIS — M8580 Other specified disorders of bone density and structure, unspecified site: Secondary | ICD-10-CM | POA: Diagnosis not present

## 2019-01-07 DIAGNOSIS — M545 Low back pain: Secondary | ICD-10-CM | POA: Diagnosis not present

## 2019-01-15 ENCOUNTER — Ambulatory Visit: Payer: BC Managed Care – PPO | Admitting: Family Medicine

## 2019-01-21 DIAGNOSIS — X58XXXD Exposure to other specified factors, subsequent encounter: Secondary | ICD-10-CM | POA: Diagnosis not present

## 2019-01-21 DIAGNOSIS — S329XXK Fracture of unspecified parts of lumbosacral spine and pelvis, subsequent encounter for fracture with nonunion: Secondary | ICD-10-CM | POA: Diagnosis not present

## 2019-01-21 DIAGNOSIS — Z981 Arthrodesis status: Secondary | ICD-10-CM | POA: Diagnosis not present

## 2019-01-21 DIAGNOSIS — M545 Low back pain: Secondary | ICD-10-CM | POA: Diagnosis not present

## 2019-01-21 DIAGNOSIS — M4326 Fusion of spine, lumbar region: Secondary | ICD-10-CM | POA: Diagnosis not present

## 2019-04-01 DIAGNOSIS — M84454D Pathological fracture, pelvis, subsequent encounter for fracture with routine healing: Secondary | ICD-10-CM | POA: Diagnosis not present

## 2019-04-01 DIAGNOSIS — M955 Acquired deformity of pelvis: Secondary | ICD-10-CM | POA: Diagnosis not present

## 2019-04-01 DIAGNOSIS — Z981 Arthrodesis status: Secondary | ICD-10-CM | POA: Diagnosis not present

## 2019-04-01 DIAGNOSIS — M4326 Fusion of spine, lumbar region: Secondary | ICD-10-CM | POA: Diagnosis not present

## 2019-04-10 DIAGNOSIS — M25552 Pain in left hip: Secondary | ICD-10-CM | POA: Diagnosis not present

## 2019-04-10 DIAGNOSIS — M545 Low back pain: Secondary | ICD-10-CM | POA: Diagnosis not present

## 2019-04-10 DIAGNOSIS — M25652 Stiffness of left hip, not elsewhere classified: Secondary | ICD-10-CM | POA: Diagnosis not present

## 2019-04-10 DIAGNOSIS — M25651 Stiffness of right hip, not elsewhere classified: Secondary | ICD-10-CM | POA: Diagnosis not present

## 2019-04-15 DIAGNOSIS — M25552 Pain in left hip: Secondary | ICD-10-CM | POA: Diagnosis not present

## 2019-04-15 DIAGNOSIS — M25651 Stiffness of right hip, not elsewhere classified: Secondary | ICD-10-CM | POA: Diagnosis not present

## 2019-04-15 DIAGNOSIS — M25652 Stiffness of left hip, not elsewhere classified: Secondary | ICD-10-CM | POA: Diagnosis not present

## 2019-04-15 DIAGNOSIS — M545 Low back pain: Secondary | ICD-10-CM | POA: Diagnosis not present

## 2019-04-16 ENCOUNTER — Telehealth: Payer: Self-pay | Admitting: Obstetrics and Gynecology

## 2019-04-16 DIAGNOSIS — M25552 Pain in left hip: Secondary | ICD-10-CM | POA: Diagnosis not present

## 2019-04-16 DIAGNOSIS — M545 Low back pain: Secondary | ICD-10-CM | POA: Diagnosis not present

## 2019-04-16 DIAGNOSIS — M25651 Stiffness of right hip, not elsewhere classified: Secondary | ICD-10-CM | POA: Diagnosis not present

## 2019-04-16 DIAGNOSIS — M25652 Stiffness of left hip, not elsewhere classified: Secondary | ICD-10-CM | POA: Diagnosis not present

## 2019-04-16 NOTE — Telephone Encounter (Signed)
Patient is experiencing burning and itching in vaginal area. States she did start her cycle last night. Patient had IUD placed 10/17/18. Itching is not as bad today, but still there. She would only like to see Dr. Talbert Nan, no other providers.

## 2019-04-16 NOTE — Telephone Encounter (Signed)
Message left to return call to Triage Nurse at [phone].    

## 2019-04-17 NOTE — Telephone Encounter (Signed)
Spoke with Traci Schmidt. Traci Schmidt states having itching and burning sx in vaginal area since starting cycle x 3 days now. Traci Schmidt denies earlier appt. Traci Schmidt scheduled for OV 11/2 at 4:30pm with Dr Talbert Nan. CPS negative.   Routing to provider for final review. Patient is agreeable to disposition. Will close encounter.

## 2019-04-21 ENCOUNTER — Other Ambulatory Visit: Payer: Self-pay

## 2019-04-21 ENCOUNTER — Encounter: Payer: Self-pay | Admitting: Obstetrics and Gynecology

## 2019-04-21 ENCOUNTER — Ambulatory Visit (INDEPENDENT_AMBULATORY_CARE_PROVIDER_SITE_OTHER): Payer: BC Managed Care – PPO | Admitting: Obstetrics and Gynecology

## 2019-04-21 VITALS — BP 140/72 | HR 88 | Temp 97.6°F | Ht 63.0 in | Wt 168.0 lb

## 2019-04-21 DIAGNOSIS — N8111 Cystocele, midline: Secondary | ICD-10-CM | POA: Diagnosis not present

## 2019-04-21 DIAGNOSIS — L292 Pruritus vulvae: Secondary | ICD-10-CM

## 2019-04-21 DIAGNOSIS — M545 Low back pain: Secondary | ICD-10-CM | POA: Diagnosis not present

## 2019-04-21 DIAGNOSIS — N816 Rectocele: Secondary | ICD-10-CM

## 2019-04-21 DIAGNOSIS — M25651 Stiffness of right hip, not elsewhere classified: Secondary | ICD-10-CM | POA: Diagnosis not present

## 2019-04-21 DIAGNOSIS — M25552 Pain in left hip: Secondary | ICD-10-CM | POA: Diagnosis not present

## 2019-04-21 DIAGNOSIS — M25652 Stiffness of left hip, not elsewhere classified: Secondary | ICD-10-CM | POA: Diagnosis not present

## 2019-04-21 MED ORDER — BETAMETHASONE VALERATE 0.1 % EX OINT: 1.0000 "application " | TOPICAL_OINTMENT | Freq: Two times a day (BID) | CUTANEOUS | 0 refills | Status: DC

## 2019-04-21 NOTE — Progress Notes (Signed)
GYNECOLOGY  VISIT   HPI: 38 y.o.   Married Asian Not Hispanic or Latino  female   [phone] with No LMP recorded. (Menstrual status: IUD).   here for she has had some itching and bleeding in her vaginal area for about the last week. The Bleeding comes and goes. She has a mirena IUD, placed on 10/17/18.  She had to have repeat surgery on her pelvis and lower back in June. Finally healing, able to walk, just starting PT.  She c/o a 5 day h/o vulvar itching, mild and intermittent. No abnormal d/c. The itching is focal.  She has been having monthly cycles since the IUD was inserted, but getting lighter, skipped her last cycle.   GYNECOLOGIC HISTORY: No LMP recorded. (Menstrual status: IUD). Contraception:IUD Menopausal hormone therapy: none        OB History    Gravida  3   Para  3   Term  3   Preterm  0   AB  0   Living  3     SAB  0   TAB  0   Ectopic  0   Multiple  0   Live Births  3              Patient Active Problem List   Diagnosis Date Noted  . Female genital prolapse 09/04/2018  . DUB (dysfunctional uterine bleeding) 07/23/2018  . 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 Acute blood loss anemia 06/25/2018  . Pelvic deformity 04/24/2018  . 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 Lumbosacral radiculopathy at L5 03/15/2018  . Pain in right ankle and joints of right foot 01/21/2018  . Low vitamin D level 01/01/2018  . Obesity (BMI 30-39.9) 06/23/2017    Past Medical History:  Diagnosis Date  . Abnormal uterine bleeding   . Allergy    seasonal  . Fibroid   . Gallstones     Past Surgical History:  Procedure Laterality Date  . BALLOON DILATION N/A 11/12/2013   Procedure: BALLOON DILATION WITH STONE EXTRACTION;  Surgeon: Daneil Dolin, MD;  Location: [facility];  Service: Endoscopy;  Laterality: N/A;  . CESAREAN SECTION    . CHOLECYSTECTOMY N/A 11/05/2013   Dr. Arnoldo Morale  . ERCP N/A 11/12/2013   Procedure: ENDOSCOPIC  RETROGRADE CHOLANGIOPANCREATOGRAPHY (ERCP), SPHINCTEROTOMY, BALLOON DILATION WITH STONE EXTRACTION;  Surgeon: Daneil Dolin, MD;  Location: [facility];  Service: Endoscopy;  Laterality: N/A;  . PELVIC FRACTURE SURGERY    . SPHINCTEROTOMY N/A 11/12/2013   Procedure: SPHINCTEROTOMY;  Surgeon: Daneil Dolin, MD;  Location: [facility];  Service: Endoscopy;  Laterality: N/A;    Current Outpatient Medications  Medication Sig Dispense Refill  . HYDROcodone-acetaminophen (NORCO/VICODIN) 5-325 MG tablet Take by mouth.    . pregabalin (LYRICA) 75 MG capsule Take 1 capsule (75 mg total) by mouth 2 (two) times daily. 60 capsule 5   No current facility-administered medications for this visit.      ALLERGIES: Patient has no known allergies.  Family History  Problem Relation Age of Onset  . Cancer Mother        breast  . Diabetes Mother   . Hypertension Mother   . Breast cancer Mother 84  . Hyperlipidemia Father   . Diabetes Father   . Diabetes Brother   . Hyperlipidemia Brother   . Hyperlipidemia Brother   . Colon cancer Neg Hx     Social History   Socioeconomic History  .  Marital status: Married    Spouse name: Not on file  . Number of children: 3  . Years of education: Not on file  . Highest education level: Not on file  Occupational History  . Not on file  Social Needs  . Financial resource strain: Not hard at all  . Food insecurity    Worry: Never true    Inability: Never true  . Transportation needs    Medical: No    Non-medical: No  Tobacco Use  . Smoking status: Never Smoker  . Smokeless tobacco: Never Used  Substance and Sexual Activity  . Alcohol use: No    Frequency: Never  . Drug use: No  . Sexual activity: Not Currently    Birth control/protection: I.U.D.  Lifestyle  . Physical activity    Days per week: Not on file    Minutes per session: Not on file  . Stress: Not on file  Relationships  . Social Herbalist on phone: Not on file    Gets together:  Not on file    Attends religious service: Not on file    Active member of club or organization: Not on file    Attends meetings of clubs or organizations: Not on file    Relationship status: Not on file  . Intimate partner violence    Fear of current or ex partner: Not on file    Emotionally abused: Not on file    Physically abused: Not on file    Forced sexual activity: Not on file  Other Topics Concern  . Not on file  Social History Narrative  . Not on file    Review of Systems  All other systems reviewed and are negative.   PHYSICAL EXAMINATION:    BP 140/72   Pulse 88   Temp 97.6 F (36.4 C)   Ht 5\' 3"  (1.6 m)   Wt 168 lb (76.2 kg)   SpO2 98%   BMI 29.76 kg/m     General appearance: alert, cooperative and appears stated age  Pelvic: External genitalia:  no lesions, no lesions, focal area of irritation to the right of the introitus, no significant skin changes.               Urethra:  normal appearing urethra with no masses, tenderness or lesions              Bartholins and Skenes: normal                 Vagina: normal appearing vagina with normal color and discharge, no lesions. Grade 2 cystocele, grade 2 rectocele, grade one uterine prolapse.               Cervix: no cervical motion tenderness, no lesions and IUD string 1 cm.               Bimanual Exam:  Uterus:  normal size, contour, position, consistency, mobility, non-tender and retroverted              Adnexa: no mass, fullness, tenderness                Chaperone was present for exam.  ASSESSMENT Vulvar pruritis, focal, no signs of vaginitis Genital prolapse, stable IUD strings seen    PLAN Treat with steroid ointment Call with any concerns   An After Visit Summary was printed and given to the patient.

## 2019-04-22 DIAGNOSIS — M955 Acquired deformity of pelvis: Secondary | ICD-10-CM | POA: Diagnosis not present

## 2019-04-22 DIAGNOSIS — N8189 Other female genital prolapse: Secondary | ICD-10-CM | POA: Diagnosis not present

## 2019-04-22 DIAGNOSIS — M4326 Fusion of spine, lumbar region: Secondary | ICD-10-CM | POA: Diagnosis not present

## 2019-04-23 DIAGNOSIS — M25651 Stiffness of right hip, not elsewhere classified: Secondary | ICD-10-CM | POA: Diagnosis not present

## 2019-04-23 DIAGNOSIS — M545 Low back pain: Secondary | ICD-10-CM | POA: Diagnosis not present

## 2019-04-23 DIAGNOSIS — M25652 Stiffness of left hip, not elsewhere classified: Secondary | ICD-10-CM | POA: Diagnosis not present

## 2019-04-23 DIAGNOSIS — M25552 Pain in left hip: Secondary | ICD-10-CM | POA: Diagnosis not present

## 2019-04-25 DIAGNOSIS — M545 Low back pain: Secondary | ICD-10-CM | POA: Diagnosis not present

## 2019-04-25 DIAGNOSIS — M25651 Stiffness of right hip, not elsewhere classified: Secondary | ICD-10-CM | POA: Diagnosis not present

## 2019-04-25 DIAGNOSIS — M25652 Stiffness of left hip, not elsewhere classified: Secondary | ICD-10-CM | POA: Diagnosis not present

## 2019-04-25 DIAGNOSIS — M25552 Pain in left hip: Secondary | ICD-10-CM | POA: Diagnosis not present

## 2019-04-28 DIAGNOSIS — M25652 Stiffness of left hip, not elsewhere classified: Secondary | ICD-10-CM | POA: Diagnosis not present

## 2019-04-28 DIAGNOSIS — M25651 Stiffness of right hip, not elsewhere classified: Secondary | ICD-10-CM | POA: Diagnosis not present

## 2019-04-28 DIAGNOSIS — M25552 Pain in left hip: Secondary | ICD-10-CM | POA: Diagnosis not present

## 2019-04-28 DIAGNOSIS — M545 Low back pain: Secondary | ICD-10-CM | POA: Diagnosis not present

## 2019-04-29 ENCOUNTER — Telehealth: Payer: Self-pay | Admitting: Obstetrics and Gynecology

## 2019-04-29 ENCOUNTER — Ambulatory Visit: Payer: BC Managed Care – PPO | Admitting: Obstetrics and Gynecology

## 2019-04-29 NOTE — Telephone Encounter (Signed)
Patient states she cannot make it to an appointment this afternoon and was wondering if the 4:30 tomorrow afternoon with Dr. Talbert Nan was still available.

## 2019-04-29 NOTE — Telephone Encounter (Signed)
Returned call to patient. Scheduled reviewed with K. Sprague, RN. Patient scheduled for 04-30-2019 at 1115 with Dr. Talbert Nan. Patient agreeable to date and time of appointment. Covid prescreening negative.

## 2019-04-29 NOTE — Telephone Encounter (Signed)
Call to patient. Patient last seen on 04-21-2019. Patient states the betamethasone ointment that was prescribed is helping with the vaginal itching, but not with the vaginal burning. Patient states she is using prescription ointment once a day. Patient states the burning has not gone away since her appointment on 04-21-2019 and she is now complaining of vaginal odor. RN advised OV recommended for further evaluation. Patient agreeable. Patient scheduled for today at 1515. Covid prescreening negative. Patient advised to arrive 15 minutes prior to appointment.   Routing to provider and will close encounter.

## 2019-04-29 NOTE — Telephone Encounter (Signed)
Patient is calling regarding vaginal burning. Patient stated that Betamethasone is helping with vaginal itching, but it is not helping the burning.

## 2019-04-30 ENCOUNTER — Encounter: Payer: Self-pay | Admitting: Obstetrics and Gynecology

## 2019-04-30 ENCOUNTER — Ambulatory Visit (INDEPENDENT_AMBULATORY_CARE_PROVIDER_SITE_OTHER): Payer: BC Managed Care – PPO | Admitting: Obstetrics and Gynecology

## 2019-04-30 ENCOUNTER — Other Ambulatory Visit: Payer: Self-pay

## 2019-04-30 VITALS — BP 126/80 | HR 84 | Temp 97.5°F | Wt 169.6 lb

## 2019-04-30 DIAGNOSIS — N898 Other specified noninflammatory disorders of vagina: Secondary | ICD-10-CM | POA: Diagnosis not present

## 2019-04-30 DIAGNOSIS — R3915 Urgency of urination: Secondary | ICD-10-CM

## 2019-04-30 DIAGNOSIS — N949 Unspecified condition associated with female genital organs and menstrual cycle: Secondary | ICD-10-CM | POA: Diagnosis not present

## 2019-04-30 LAB — POCT URINALYSIS DIPSTICK
Bilirubin, UA: NEGATIVE
Blood, UA: NEGATIVE
Glucose, UA: NEGATIVE
Ketones, UA: NEGATIVE
Leukocytes, UA: NEGATIVE
Nitrite, UA: NEGATIVE
Protein, UA: NEGATIVE
Spec Grav, UA: 1.01 (ref 1.010–1.025)
Urobilinogen, UA: 0.2 E.U./dL
pH, UA: 5 (ref 5.0–8.0)

## 2019-04-30 NOTE — Progress Notes (Signed)
GYNECOLOGY  VISIT   HPI: 38 y.o.   Married Asian Not Hispanic or Latino  female   [phone] with No LMP recorded. (Menstrual status: IUD).   here for intermittent vaginal burning, and urinary frequency. She was seen last week with focal vulvar pruritis and was treated with a steroid ointment. The itching is gone, but is having intermittent burning, intense. Intermittent vaginal odor.  She is having urinary urgency, sits on the toilet and nothing happens. It burns. When she actually voids is doesn't burn, voids normal amounts.   GYNECOLOGIC HISTORY: No LMP recorded. (Menstrual status: IUD). Contraception: IUD Menopausal hormone therapy: None        OB History    Gravida  3   Para  3   Term  3   Preterm  0   AB  0   Living  3     SAB  0   TAB  0   Ectopic  0   Multiple  0   Live Births  3              Patient Active Problem List   Diagnosis Date Noted  . Female genital prolapse 09/04/2018  . DUB (dysfunctional uterine bleeding) 07/23/2018  . 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 Acute blood loss anemia 06/25/2018  . Pelvic deformity 04/24/2018  . 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 Lumbosacral radiculopathy at L5 03/15/2018  . Pain in right ankle and joints of right foot 01/21/2018  . Low vitamin D level 01/01/2018  . Obesity (BMI 30-39.9) 06/23/2017    Past Medical History:  Diagnosis Date  . Abnormal uterine bleeding   . Allergy    seasonal  . Fibroid   . Gallstones     Past Surgical History:  Procedure Laterality Date  . BALLOON DILATION N/A 11/12/2013   Procedure: BALLOON DILATION WITH STONE EXTRACTION;  Surgeon: Daneil Dolin, MD;  Location: [facility];  Service: Endoscopy;  Laterality: N/A;  . CESAREAN SECTION    . CHOLECYSTECTOMY N/A 11/05/2013   Dr. Arnoldo Morale  . ERCP N/A 11/12/2013   Procedure: ENDOSCOPIC RETROGRADE CHOLANGIOPANCREATOGRAPHY (ERCP), SPHINCTEROTOMY, BALLOON DILATION WITH STONE EXTRACTION;   Surgeon: Daneil Dolin, MD;  Location: [facility];  Service: Endoscopy;  Laterality: N/A;  . PELVIC FRACTURE SURGERY    . SPHINCTEROTOMY N/A 11/12/2013   Procedure: SPHINCTEROTOMY;  Surgeon: Daneil Dolin, MD;  Location: [facility];  Service: Endoscopy;  Laterality: N/A;    Current Outpatient Medications  Medication Sig Dispense Refill  . betamethasone valerate ointment (VALISONE) 0.1 % Apply 1 application topically 2 (two) times daily. You can use this for up to a week as needed 15 g 0  . HYDROcodone-acetaminophen (NORCO/VICODIN) 5-325 MG tablet Take by mouth.    . pregabalin (LYRICA) 75 MG capsule Take 1 capsule (75 mg total) by mouth 2 (two) times daily. 60 capsule 5   No current facility-administered medications for this visit.      ALLERGIES: Patient has no known allergies.  Family History  Problem Relation Age of Onset  . Cancer Mother        breast  . Diabetes Mother   . Hypertension Mother   . Breast cancer Mother 63  . Hyperlipidemia Father   . Diabetes Father   . Diabetes Brother   . Hyperlipidemia Brother   . Hyperlipidemia Brother   . Colon cancer Neg Hx     Social History   Socioeconomic History  .  Marital status: Married    Spouse name: Not on file  . Number of children: 3  . Years of education: Not on file  . Highest education level: Not on file  Occupational History  . Not on file  Social Needs  . Financial resource strain: Not on file  . Food insecurity    Worry: Not on file    Inability: Not on file  . Transportation needs    Medical: Not on file    Non-medical: Not on file  Tobacco Use  . Smoking status: Never Smoker  . Smokeless tobacco: Never Used  Substance and Sexual Activity  . Alcohol use: No    Frequency: Never  . Drug use: No  . Sexual activity: Not Currently    Birth control/protection: I.U.D.  Lifestyle  . Physical activity    Days per week: Not on file    Minutes per session: Not on file  . Stress: Not on file  Relationships  .  Social Herbalist on phone: Not on file    Gets together: Not on file    Attends religious service: Not on file    Active member of club or organization: Not on file    Attends meetings of clubs or organizations: Not on file    Relationship status: Not on file  . Intimate partner violence    Fear of current or ex partner: Not on file    Emotionally abused: Not on file    Physically abused: Not on file    Forced sexual activity: Not on file  Other Topics Concern  . Not on file  Social History Narrative  . Not on file    Review of Systems  Constitutional: Negative.   HENT: Negative.   Eyes: Negative.   Respiratory: Negative.   Cardiovascular: Negative.   Gastrointestinal: Negative.   Genitourinary: Positive for frequency.       Vaginal burning  Musculoskeletal: Negative.   Skin: Negative.   Neurological: Negative.   Endo/Heme/Allergies: Negative.   Psychiatric/Behavioral: Negative.     PHYSICAL EXAMINATION:    BP 126/80 (BP Location: Right Arm, Patient Position: Sitting, Cuff Size: Normal)   Pulse 84   Temp (!) 97.5 F (36.4 C) (Skin)   Wt 169 lb 9.6 oz (76.9 kg)   BMI 30.04 kg/m     General appearance: alert, cooperative and appears stated age  Pelvic: External genitalia:  no lesions              Urethra:  normal appearing urethra with no masses, tenderness or lesions              Bartholins and Skenes: normal                 Vagina: normal appearing vagina with normal color and discharge, no lesions              Cervix: no lesions and IUD string 1 cm             Chaperone was present for exam.  Wet prep: no clue, no trich, + wbc KOH: no yeast PH: 4  ASSESSMENT Urinary urgency, seems mostly from irritation near the opening of her urethra. Negative urine dip Vaginal burning, intermittent odor. Exam suspicious for yeast, not clearly seen on slides.     PLAN Urine for ua, c&s Will send nuswab for vaginitis panel Will treat with diflucan for  suspected yeast    An After Visit Summary was  printed and given to the patient.

## 2019-05-01 LAB — URINALYSIS, MICROSCOPIC ONLY: Casts: NONE SEEN /lpf

## 2019-05-02 ENCOUNTER — Telehealth: Payer: Self-pay | Admitting: Obstetrics and Gynecology

## 2019-05-02 DIAGNOSIS — M25651 Stiffness of right hip, not elsewhere classified: Secondary | ICD-10-CM | POA: Diagnosis not present

## 2019-05-02 DIAGNOSIS — M25652 Stiffness of left hip, not elsewhere classified: Secondary | ICD-10-CM | POA: Diagnosis not present

## 2019-05-02 DIAGNOSIS — M545 Low back pain: Secondary | ICD-10-CM | POA: Diagnosis not present

## 2019-05-02 DIAGNOSIS — M25552 Pain in left hip: Secondary | ICD-10-CM | POA: Diagnosis not present

## 2019-05-02 LAB — NUSWAB BV AND CANDIDA, NAA
Candida albicans, NAA: NEGATIVE
Candida glabrata, NAA: NEGATIVE

## 2019-05-02 LAB — URINE CULTURE

## 2019-05-02 NOTE — Telephone Encounter (Signed)
Spoke to pt. Pt states wants to know about getting Diflucan Rx from [facility] on 11/11. Labs from [facility] on 04/30/19 were negative,   Glorianne Manchester, RN spoke with Dr Talbert Nan via telephone. Instructed pt to not have Diflucan rx at this time since labs were negative. Pt to get Replense OTC for vaginal dryness.    Spoke with pt after Dr Albertina Senegal recommendations and pt verbalized understanding of using Replense. Pt to call back if symptoms don't improve.   Routing to provider for final review. Patient is agreeable to disposition. Will close encounter.

## 2019-05-02 NOTE — Telephone Encounter (Signed)
Patient's pharmacy does not have prescription for yeast infection that was called in on 11/11.

## 2019-05-05 DIAGNOSIS — M25552 Pain in left hip: Secondary | ICD-10-CM | POA: Diagnosis not present

## 2019-05-05 DIAGNOSIS — M25651 Stiffness of right hip, not elsewhere classified: Secondary | ICD-10-CM | POA: Diagnosis not present

## 2019-05-05 DIAGNOSIS — M25652 Stiffness of left hip, not elsewhere classified: Secondary | ICD-10-CM | POA: Diagnosis not present

## 2019-05-05 DIAGNOSIS — M545 Low back pain: Secondary | ICD-10-CM | POA: Diagnosis not present

## 2019-05-07 DIAGNOSIS — M25652 Stiffness of left hip, not elsewhere classified: Secondary | ICD-10-CM | POA: Diagnosis not present

## 2019-05-07 DIAGNOSIS — M545 Low back pain: Secondary | ICD-10-CM | POA: Diagnosis not present

## 2019-05-07 DIAGNOSIS — M25552 Pain in left hip: Secondary | ICD-10-CM | POA: Diagnosis not present

## 2019-05-07 DIAGNOSIS — M25651 Stiffness of right hip, not elsewhere classified: Secondary | ICD-10-CM | POA: Diagnosis not present

## 2019-05-11 DIAGNOSIS — M25652 Stiffness of left hip, not elsewhere classified: Secondary | ICD-10-CM | POA: Diagnosis not present

## 2019-05-11 DIAGNOSIS — M545 Low back pain: Secondary | ICD-10-CM | POA: Diagnosis not present

## 2019-05-11 DIAGNOSIS — M25552 Pain in left hip: Secondary | ICD-10-CM | POA: Diagnosis not present

## 2019-05-11 DIAGNOSIS — M25651 Stiffness of right hip, not elsewhere classified: Secondary | ICD-10-CM | POA: Diagnosis not present

## 2019-05-12 DIAGNOSIS — M545 Low back pain: Secondary | ICD-10-CM | POA: Diagnosis not present

## 2019-05-12 DIAGNOSIS — M25651 Stiffness of right hip, not elsewhere classified: Secondary | ICD-10-CM | POA: Diagnosis not present

## 2019-05-12 DIAGNOSIS — M25652 Stiffness of left hip, not elsewhere classified: Secondary | ICD-10-CM | POA: Diagnosis not present

## 2019-05-12 DIAGNOSIS — M25552 Pain in left hip: Secondary | ICD-10-CM | POA: Diagnosis not present

## 2019-05-14 DIAGNOSIS — M25651 Stiffness of right hip, not elsewhere classified: Secondary | ICD-10-CM | POA: Diagnosis not present

## 2019-05-14 DIAGNOSIS — M25552 Pain in left hip: Secondary | ICD-10-CM | POA: Diagnosis not present

## 2019-05-14 DIAGNOSIS — M545 Low back pain: Secondary | ICD-10-CM | POA: Diagnosis not present

## 2019-05-14 DIAGNOSIS — M25652 Stiffness of left hip, not elsewhere classified: Secondary | ICD-10-CM | POA: Diagnosis not present

## 2019-05-20 ENCOUNTER — Other Ambulatory Visit: Payer: Self-pay

## 2019-05-20 ENCOUNTER — Ambulatory Visit (INDEPENDENT_AMBULATORY_CARE_PROVIDER_SITE_OTHER): Payer: BC Managed Care – PPO | Admitting: *Deleted

## 2019-05-20 DIAGNOSIS — Z23 Encounter for immunization: Secondary | ICD-10-CM

## 2019-05-20 NOTE — Progress Notes (Signed)
Patient seen in office for Influenza Vaccination.   Tolerated IM administration well.   Immunization history updated.  

## 2019-05-26 DIAGNOSIS — M25552 Pain in left hip: Secondary | ICD-10-CM | POA: Diagnosis not present

## 2019-05-26 DIAGNOSIS — M25652 Stiffness of left hip, not elsewhere classified: Secondary | ICD-10-CM | POA: Diagnosis not present

## 2019-05-26 DIAGNOSIS — M25651 Stiffness of right hip, not elsewhere classified: Secondary | ICD-10-CM | POA: Diagnosis not present

## 2019-05-26 DIAGNOSIS — M545 Low back pain: Secondary | ICD-10-CM | POA: Diagnosis not present

## 2019-05-28 DIAGNOSIS — M25652 Stiffness of left hip, not elsewhere classified: Secondary | ICD-10-CM | POA: Diagnosis not present

## 2019-05-28 DIAGNOSIS — M25651 Stiffness of right hip, not elsewhere classified: Secondary | ICD-10-CM | POA: Diagnosis not present

## 2019-05-28 DIAGNOSIS — M545 Low back pain: Secondary | ICD-10-CM | POA: Diagnosis not present

## 2019-05-28 DIAGNOSIS — M25552 Pain in left hip: Secondary | ICD-10-CM | POA: Diagnosis not present

## 2019-05-30 DIAGNOSIS — M25651 Stiffness of right hip, not elsewhere classified: Secondary | ICD-10-CM | POA: Diagnosis not present

## 2019-05-30 DIAGNOSIS — M25652 Stiffness of left hip, not elsewhere classified: Secondary | ICD-10-CM | POA: Diagnosis not present

## 2019-05-30 DIAGNOSIS — M25552 Pain in left hip: Secondary | ICD-10-CM | POA: Diagnosis not present

## 2019-05-30 DIAGNOSIS — M545 Low back pain: Secondary | ICD-10-CM | POA: Diagnosis not present

## 2019-06-03 DIAGNOSIS — M25651 Stiffness of right hip, not elsewhere classified: Secondary | ICD-10-CM | POA: Diagnosis not present

## 2019-06-03 DIAGNOSIS — M545 Low back pain: Secondary | ICD-10-CM | POA: Diagnosis not present

## 2019-06-03 DIAGNOSIS — M25552 Pain in left hip: Secondary | ICD-10-CM | POA: Diagnosis not present

## 2019-06-03 DIAGNOSIS — M25652 Stiffness of left hip, not elsewhere classified: Secondary | ICD-10-CM | POA: Diagnosis not present

## 2019-06-05 DIAGNOSIS — M545 Low back pain: Secondary | ICD-10-CM | POA: Diagnosis not present

## 2019-06-05 DIAGNOSIS — M25552 Pain in left hip: Secondary | ICD-10-CM | POA: Diagnosis not present

## 2019-06-05 DIAGNOSIS — M25652 Stiffness of left hip, not elsewhere classified: Secondary | ICD-10-CM | POA: Diagnosis not present

## 2019-06-05 DIAGNOSIS — M25651 Stiffness of right hip, not elsewhere classified: Secondary | ICD-10-CM | POA: Diagnosis not present

## 2019-06-06 DIAGNOSIS — M25552 Pain in left hip: Secondary | ICD-10-CM | POA: Diagnosis not present

## 2019-06-06 DIAGNOSIS — M25651 Stiffness of right hip, not elsewhere classified: Secondary | ICD-10-CM | POA: Diagnosis not present

## 2019-06-06 DIAGNOSIS — M545 Low back pain: Secondary | ICD-10-CM | POA: Diagnosis not present

## 2019-06-06 DIAGNOSIS — M25652 Stiffness of left hip, not elsewhere classified: Secondary | ICD-10-CM | POA: Diagnosis not present

## 2019-06-11 DIAGNOSIS — M25652 Stiffness of left hip, not elsewhere classified: Secondary | ICD-10-CM | POA: Diagnosis not present

## 2019-06-11 DIAGNOSIS — M25552 Pain in left hip: Secondary | ICD-10-CM | POA: Diagnosis not present

## 2019-06-11 DIAGNOSIS — M25651 Stiffness of right hip, not elsewhere classified: Secondary | ICD-10-CM | POA: Diagnosis not present

## 2019-06-11 DIAGNOSIS — M545 Low back pain: Secondary | ICD-10-CM | POA: Diagnosis not present

## 2019-06-17 DIAGNOSIS — M25652 Stiffness of left hip, not elsewhere classified: Secondary | ICD-10-CM | POA: Diagnosis not present

## 2019-06-17 DIAGNOSIS — M25651 Stiffness of right hip, not elsewhere classified: Secondary | ICD-10-CM | POA: Diagnosis not present

## 2019-06-17 DIAGNOSIS — M545 Low back pain: Secondary | ICD-10-CM | POA: Diagnosis not present

## 2019-06-17 DIAGNOSIS — M25552 Pain in left hip: Secondary | ICD-10-CM | POA: Diagnosis not present

## 2019-06-19 DIAGNOSIS — M25552 Pain in left hip: Secondary | ICD-10-CM | POA: Diagnosis not present

## 2019-06-19 DIAGNOSIS — M25652 Stiffness of left hip, not elsewhere classified: Secondary | ICD-10-CM | POA: Diagnosis not present

## 2019-06-19 DIAGNOSIS — M545 Low back pain: Secondary | ICD-10-CM | POA: Diagnosis not present

## 2019-06-19 DIAGNOSIS — M25651 Stiffness of right hip, not elsewhere classified: Secondary | ICD-10-CM | POA: Diagnosis not present

## 2019-11-19 DIAGNOSIS — G8921 Chronic pain due to trauma: Secondary | ICD-10-CM | POA: Diagnosis not present

## 2019-11-19 DIAGNOSIS — G8918 Other acute postprocedural pain: Secondary | ICD-10-CM | POA: Diagnosis not present

## 2019-11-19 DIAGNOSIS — M955 Acquired deformity of pelvis: Secondary | ICD-10-CM | POA: Diagnosis not present

## 2019-11-26 DIAGNOSIS — M955 Acquired deformity of pelvis: Secondary | ICD-10-CM | POA: Diagnosis not present

## 2019-11-26 DIAGNOSIS — S3210XK Unspecified fracture of sacrum, subsequent encounter for fracture with nonunion: Secondary | ICD-10-CM | POA: Diagnosis not present

## 2019-11-26 DIAGNOSIS — S32402K Unspecified fracture of left acetabulum, subsequent encounter for fracture with nonunion: Secondary | ICD-10-CM | POA: Diagnosis not present

## 2019-11-26 DIAGNOSIS — M21852 Other specified acquired deformities of left thigh: Secondary | ICD-10-CM | POA: Diagnosis not present

## 2019-11-26 DIAGNOSIS — M438X8 Other specified deforming dorsopathies, sacral and sacrococcygeal region: Secondary | ICD-10-CM | POA: Diagnosis not present

## 2019-11-26 DIAGNOSIS — S329XXK Fracture of unspecified parts of lumbosacral spine and pelvis, subsequent encounter for fracture with nonunion: Secondary | ICD-10-CM | POA: Diagnosis not present

## 2020-01-07 ENCOUNTER — Telehealth: Payer: Self-pay | Admitting: Family Medicine

## 2020-01-07 NOTE — Telephone Encounter (Signed)
CB# [phone] Pt had schedule cpe through my chart on 01/12/20 @ 12  Noon  Which doesn't give Dr. enough time. I reschedule to 01/16/20 @ 10:30 I have left phone vm also sent message through my chart

## 2020-01-07 NOTE — Telephone Encounter (Signed)
Call placed to patient and patient made aware.   Agreeable to schedule change.

## 2020-01-12 ENCOUNTER — Ambulatory Visit: Payer: BC Managed Care – PPO | Admitting: Family Medicine

## 2020-01-16 ENCOUNTER — Ambulatory Visit (INDEPENDENT_AMBULATORY_CARE_PROVIDER_SITE_OTHER): Payer: BC Managed Care – PPO | Admitting: Family Medicine

## 2020-01-16 ENCOUNTER — Other Ambulatory Visit: Payer: Self-pay

## 2020-01-16 ENCOUNTER — Encounter: Payer: Self-pay | Admitting: *Deleted

## 2020-01-16 VITALS — BP 128/64 | HR 78 | Temp 98.1°F | Resp 14 | Ht 63.0 in | Wt 181.0 lb

## 2020-01-16 DIAGNOSIS — K5900 Constipation, unspecified: Secondary | ICD-10-CM

## 2020-01-16 DIAGNOSIS — Z114 Encounter for screening for human immunodeficiency virus [HIV]: Secondary | ICD-10-CM

## 2020-01-16 DIAGNOSIS — E559 Vitamin D deficiency, unspecified: Secondary | ICD-10-CM | POA: Diagnosis not present

## 2020-01-16 DIAGNOSIS — Z1159 Encounter for screening for other viral diseases: Secondary | ICD-10-CM

## 2020-01-16 DIAGNOSIS — Z1231 Encounter for screening mammogram for malignant neoplasm of breast: Secondary | ICD-10-CM | POA: Diagnosis not present

## 2020-01-16 DIAGNOSIS — N62 Hypertrophy of breast: Secondary | ICD-10-CM

## 2020-01-16 DIAGNOSIS — Z0001 Encounter for general adult medical examination with abnormal findings: Secondary | ICD-10-CM | POA: Diagnosis not present

## 2020-01-16 DIAGNOSIS — E669 Obesity, unspecified: Secondary | ICD-10-CM | POA: Diagnosis not present

## 2020-01-16 DIAGNOSIS — Z0000 Encounter for general adult medical examination without abnormal findings: Secondary | ICD-10-CM

## 2020-01-16 NOTE — Progress Notes (Signed)
Subjective:    Patient ID: Traci Schmidt, female    DOB: 10-26-1980, 39 y.o.   MRN: [id number]  Patient presents for Annual Exam (is fasting)  Patient here for complete physical exam.  Medications reviewed. She is s/p significant pelvic surgery   she has been maintained on lyrica 25mg  at bedtime She continues to have low back pain and pain into left groin, they are still trying to figure out cause She has norco but rarely takes , typically takes tylenol with her lyrica  Has appt next week with ortho Dr. Macario Carls  Due for mammogram- last done June 2020   PAP Smear UTD 2019 - GYN  - Dr. Nelson Chimes  Immunizations-    Due for TDAP/ discussed COVID-19  Discussed Hep C/HIV Screening   Family history DM    Constipation- any time she takes constipation medicine she has burning with urination, but never found to have true UTI. Even OTC meds cause the sensation, she discussed with her GYN, did not feel like it was related to Mirena. Last given Linzess which worked for bowels but then had burning sensation   Dentist  UTD  COVID-19 Vaccines- UTD    Breast reduction- her large 4 DD breast size, causes paininto shoulder and upper back. Would like to discuss reduction    Review Of Systems:  GEN- denies fatigue, fever, weight loss,weakness, recent illness HEENT- denies eye drainage, change in vision, nasal discharge, CVS- denies chest pain, palpitations RESP- denies SOB, cough, wheeze ABD- denies N/V, change in stools, abd pain GU- denies dysuria, hematuria, dribbling, incontinence MSK- + joint pain, muscle aches, injury Neuro- denies headache, dizziness, syncope, seizure activity       Objective:    BP (!) 128/64   Pulse 78   Temp 98.1 F (36.7 C) (Temporal)   Resp 14   Ht 5\' 3"  (1.6 m)   Wt 181 lb (82.1 kg)   SpO2 99%   BMI 32.06 kg/m  GEN- NAD, alert and oriented x3 HEENT- PERRL, EOMI, non injected sclera, pink conjunctiva, MMM, oropharynx clear Neck-  Supple, no thyromegaly CVS- RRR, no murmur RESP-CTAB MSK- Indentions in bilat shoulder from bra strap, TTP in these region, FROM upper ext  Psych- normal affect and mood  ABD-NABS,soft,NT,ND EXT- No edema Pulses- Radial, DP- 2+        Assessment & Plan:      Problem List Items Addressed This Visit      Unprioritized   Obesity (BMI 30-39.9)    Reduce simple carbs/snack food      Relevant Orders   Hemoglobin A1c   Vitamin D deficiency   Relevant Orders   HIV Antibody (routine testing w rflx)   Vitamin D, 25-hydroxy    Other Visit Diagnoses    Routine general medical examination at a health care facility    -  Primary   CPE done, TDAP due, will get at next visit, fasting labs, schedule mammo   Relevant Orders   CBC with Differential/Platelet   Comprehensive metabolic panel   Lipid panel   TSH   Hemoglobin A1c   Encounter for screening mammogram for malignant neoplasm of breast       Relevant Orders   MM 3D SCREEN BREAST BILATERAL   Encounter for screening for HIV       Need for hepatitis C screening test       Relevant Orders   Hepatitis C antibody   Large breasts  referral for  plastic surgery consult    Relevant Orders   Ambulatory referral to Plastic Surgery   Constipation, unspecified constipation type       Increase water, fiber in diet, add probiotics, Other OTC meds have not helped, unclear cause of dysuria with laxatives       Note: This dictation was prepared with Dragon dictation along with smaller phrase technology. Any transcriptional errors that result from this process are unintentional.

## 2020-01-16 NOTE — Assessment & Plan Note (Signed)
Reduce simple carbs/snack food

## 2020-01-16 NOTE — Patient Instructions (Addendum)
For constipation - Increase water, Fiber ( Fiberone ) , Probiotics ( Align/ Culturelle/ Digestive Advantage) Referral to plastic surgery  Schedule mammogram  F/U 1 year for physical

## 2020-01-19 LAB — CBC WITH DIFFERENTIAL/PLATELET
Absolute Monocytes: 449 cells/uL (ref 200–950)
Basophils Absolute: 32 cells/uL (ref 0–200)
Basophils Relative: 0.3 %
Eosinophils Absolute: 139 cells/uL (ref 15–500)
Eosinophils Relative: 1.3 %
HCT: 38.1 % (ref 35.0–45.0)
Hemoglobin: 11.8 g/dL (ref 11.7–15.5)
Lymphs Abs: 3788 cells/uL (ref 850–3900)
MCH: 24.9 pg — ABNORMAL LOW (ref 27.0–33.0)
MCHC: 31 g/dL — ABNORMAL LOW (ref 32.0–36.0)
MCV: 80.5 fL (ref 80.0–100.0)
MPV: 9.4 fL (ref 7.5–12.5)
Monocytes Relative: 4.2 %
Neutro Abs: 6292 cells/uL (ref 1500–7800)
Neutrophils Relative %: 58.8 %
Platelets: 431 10*3/uL — ABNORMAL HIGH (ref 140–400)
RBC: 4.73 10*6/uL (ref 3.80–5.10)
RDW: 14.2 % (ref 11.0–15.0)
Total Lymphocyte: 35.4 %
WBC: 10.7 10*3/uL (ref 3.8–10.8)

## 2020-01-19 LAB — COMPREHENSIVE METABOLIC PANEL
AG Ratio: 1.3 (calc) (ref 1.0–2.5)
ALT: 15 U/L (ref 6–29)
AST: 15 U/L (ref 10–30)
Albumin: 4 g/dL (ref 3.6–5.1)
Alkaline phosphatase (APISO): 64 U/L (ref 31–125)
BUN: 7 mg/dL (ref 7–25)
CO2: 24 mmol/L (ref 20–32)
Calcium: 9 mg/dL (ref 8.6–10.2)
Chloride: 105 mmol/L (ref 98–110)
Creat: 0.63 mg/dL (ref 0.50–1.10)
Globulin: 3.2 g/dL (calc) (ref 1.9–3.7)
Glucose, Bld: 85 mg/dL (ref 65–99)
Potassium: 4.6 mmol/L (ref 3.5–5.3)
Sodium: 140 mmol/L (ref 135–146)
Total Bilirubin: 0.3 mg/dL (ref 0.2–1.2)
Total Protein: 7.2 g/dL (ref 6.1–8.1)

## 2020-01-19 LAB — TSH: TSH: 1.38 mIU/L

## 2020-01-19 LAB — LIPID PANEL
Cholesterol: 166 mg/dL (ref <200)
HDL: 68 mg/dL (ref >=50)
LDL Cholesterol (Calc): 84 mg/dL (calc)
Non-HDL Cholesterol (Calc): 98 mg/dL (calc) (ref <130)
Total CHOL/HDL Ratio: 2.4 (calc) (ref <5.0)
Triglycerides: 62 mg/dL (ref <150)

## 2020-01-19 LAB — HIV ANTIBODY (ROUTINE TESTING W REFLEX): HIV 1&2 Ab, 4th Generation: NONREACTIVE

## 2020-01-19 LAB — VITAMIN D 25 HYDROXY (VIT D DEFICIENCY, FRACTURES): Vit D, 25-Hydroxy: 16 ng/mL — ABNORMAL LOW (ref 30–100)

## 2020-01-19 LAB — HEMOGLOBIN A1C
Hgb A1c MFr Bld: 5.4 % of total Hgb (ref <5.7)
Mean Plasma Glucose: 108 (calc)
eAG (mmol/L): 6 (calc)

## 2020-01-19 LAB — HEPATITIS C ANTIBODY
Hepatitis C Ab: NONREACTIVE
SIGNAL TO CUT-OFF: 0.02 (ref <1.00)

## 2020-01-21 ENCOUNTER — Ambulatory Visit: Payer: BC Managed Care – PPO

## 2020-01-22 ENCOUNTER — Encounter: Payer: Self-pay | Admitting: *Deleted

## 2020-01-22 ENCOUNTER — Other Ambulatory Visit: Payer: Self-pay | Admitting: *Deleted

## 2020-01-22 DIAGNOSIS — L2089 Other atopic dermatitis: Secondary | ICD-10-CM | POA: Diagnosis not present

## 2020-01-22 MED ORDER — VITAMIN D (ERGOCALCIFEROL) 1.25 MG (50000 UNIT) PO CAPS: 50000.0000 [IU] | ORAL_CAPSULE | ORAL | 0 refills | Status: AC

## 2020-01-23 ENCOUNTER — Other Ambulatory Visit (INDEPENDENT_AMBULATORY_CARE_PROVIDER_SITE_OTHER): Payer: Self-pay | Admitting: Physician Assistant

## 2020-02-03 DIAGNOSIS — X58XXXD Exposure to other specified factors, subsequent encounter: Secondary | ICD-10-CM | POA: Diagnosis not present

## 2020-02-03 DIAGNOSIS — M955 Acquired deformity of pelvis: Secondary | ICD-10-CM | POA: Diagnosis not present

## 2020-02-03 DIAGNOSIS — S329XXD Fracture of unspecified parts of lumbosacral spine and pelvis, subsequent encounter for fracture with routine healing: Secondary | ICD-10-CM | POA: Diagnosis not present

## 2020-02-03 DIAGNOSIS — S329XXK Fracture of unspecified parts of lumbosacral spine and pelvis, subsequent encounter for fracture with nonunion: Secondary | ICD-10-CM | POA: Diagnosis not present

## 2020-02-03 DIAGNOSIS — M25552 Pain in left hip: Secondary | ICD-10-CM | POA: Diagnosis not present

## 2020-02-03 DIAGNOSIS — M25559 Pain in unspecified hip: Secondary | ICD-10-CM | POA: Diagnosis not present

## 2020-02-09 DIAGNOSIS — M4316 Spondylolisthesis, lumbar region: Secondary | ICD-10-CM | POA: Diagnosis not present

## 2020-02-09 DIAGNOSIS — M25552 Pain in left hip: Secondary | ICD-10-CM | POA: Diagnosis not present

## 2020-02-09 DIAGNOSIS — X58XXXD Exposure to other specified factors, subsequent encounter: Secondary | ICD-10-CM | POA: Diagnosis not present

## 2020-02-09 DIAGNOSIS — S329XXK Fracture of unspecified parts of lumbosacral spine and pelvis, subsequent encounter for fracture with nonunion: Secondary | ICD-10-CM | POA: Diagnosis not present

## 2020-02-09 DIAGNOSIS — M955 Acquired deformity of pelvis: Secondary | ICD-10-CM | POA: Diagnosis not present

## 2020-02-09 DIAGNOSIS — Z981 Arthrodesis status: Secondary | ICD-10-CM | POA: Diagnosis not present

## 2020-02-09 DIAGNOSIS — Z8781 Personal history of (healed) traumatic fracture: Secondary | ICD-10-CM | POA: Diagnosis not present

## 2020-02-09 DIAGNOSIS — S3993XA Unspecified injury of pelvis, initial encounter: Secondary | ICD-10-CM | POA: Diagnosis not present

## 2020-02-09 DIAGNOSIS — G8929 Other chronic pain: Secondary | ICD-10-CM | POA: Diagnosis not present

## 2020-02-11 ENCOUNTER — Ambulatory Visit
Admission: RE | Admit: 2020-02-11 | Discharge: 2020-02-11 | Disposition: A | Discharge status: Home / Self Care | Payer: Medicaid Other | Source: Ambulatory Visit | Attending: Family Medicine | Admitting: Family Medicine

## 2020-02-11 ENCOUNTER — Other Ambulatory Visit: Payer: Self-pay

## 2020-02-11 DIAGNOSIS — Z1231 Encounter for screening mammogram for malignant neoplasm of breast: Secondary | ICD-10-CM

## 2020-02-13 ENCOUNTER — Other Ambulatory Visit: Payer: Self-pay | Admitting: Family Medicine

## 2020-02-13 DIAGNOSIS — R928 Other abnormal and inconclusive findings on diagnostic imaging of breast: Secondary | ICD-10-CM

## 2020-02-17 ENCOUNTER — Ambulatory Visit (INDEPENDENT_AMBULATORY_CARE_PROVIDER_SITE_OTHER): Payer: BC Managed Care – PPO | Admitting: *Deleted

## 2020-02-17 ENCOUNTER — Other Ambulatory Visit: Payer: Self-pay

## 2020-02-17 DIAGNOSIS — Z23 Encounter for immunization: Secondary | ICD-10-CM | POA: Diagnosis not present

## 2020-02-20 ENCOUNTER — Other Ambulatory Visit: Payer: Self-pay

## 2020-02-20 ENCOUNTER — Other Ambulatory Visit: Payer: Self-pay | Admitting: Family Medicine

## 2020-02-20 ENCOUNTER — Ambulatory Visit
Admission: RE | Admit: 2020-02-20 | Discharge: 2020-02-20 | Disposition: A | Discharge status: Home / Self Care | Payer: BC Managed Care – PPO | Source: Ambulatory Visit | Attending: Family Medicine | Admitting: Family Medicine

## 2020-02-20 DIAGNOSIS — R599 Enlarged lymph nodes, unspecified: Secondary | ICD-10-CM

## 2020-02-20 DIAGNOSIS — N6320 Unspecified lump in the left breast, unspecified quadrant: Secondary | ICD-10-CM

## 2020-02-20 DIAGNOSIS — R928 Other abnormal and inconclusive findings on diagnostic imaging of breast: Secondary | ICD-10-CM

## 2020-02-20 DIAGNOSIS — N6323 Unspecified lump in the left breast, lower outer quadrant: Secondary | ICD-10-CM | POA: Diagnosis not present

## 2020-02-24 ENCOUNTER — Encounter: Payer: Self-pay | Admitting: Family Medicine

## 2020-03-03 ENCOUNTER — Inpatient Hospital Stay: Admission: RE | Admit: 2020-03-03 | Payer: BC Managed Care – PPO | Source: Ambulatory Visit

## 2020-03-03 ENCOUNTER — Other Ambulatory Visit: Payer: BC Managed Care – PPO

## 2020-03-26 ENCOUNTER — Ambulatory Visit
Admission: RE | Admit: 2020-03-26 | Discharge: 2020-03-26 | Disposition: A | Discharge status: Home / Self Care | Payer: BC Managed Care – PPO | Source: Ambulatory Visit | Attending: Family Medicine | Admitting: Family Medicine

## 2020-03-26 ENCOUNTER — Other Ambulatory Visit: Payer: Self-pay

## 2020-03-26 DIAGNOSIS — R599 Enlarged lymph nodes, unspecified: Secondary | ICD-10-CM

## 2020-03-26 DIAGNOSIS — D242 Benign neoplasm of left breast: Secondary | ICD-10-CM | POA: Diagnosis not present

## 2020-03-26 DIAGNOSIS — N6320 Unspecified lump in the left breast, unspecified quadrant: Secondary | ICD-10-CM

## 2020-03-26 DIAGNOSIS — N6323 Unspecified lump in the left breast, lower outer quadrant: Secondary | ICD-10-CM | POA: Diagnosis not present

## 2020-04-13 ENCOUNTER — Ambulatory Visit (INDEPENDENT_AMBULATORY_CARE_PROVIDER_SITE_OTHER): Payer: BC Managed Care – PPO | Admitting: Plastic Surgery

## 2020-04-13 ENCOUNTER — Encounter: Payer: Self-pay | Admitting: Plastic Surgery

## 2020-04-13 ENCOUNTER — Other Ambulatory Visit: Payer: Self-pay

## 2020-04-13 VITALS — BP 123/82 | HR 79 | Temp 98.4°F | Ht 62.0 in | Wt 184.6 lb

## 2020-04-13 DIAGNOSIS — M4326 Fusion of spine, lumbar region: Secondary | ICD-10-CM

## 2020-04-13 DIAGNOSIS — M542 Cervicalgia: Secondary | ICD-10-CM

## 2020-04-13 DIAGNOSIS — M5417 Radiculopathy, lumbosacral region: Secondary | ICD-10-CM

## 2020-04-13 DIAGNOSIS — N62 Hypertrophy of breast: Secondary | ICD-10-CM | POA: Diagnosis not present

## 2020-04-13 NOTE — Progress Notes (Signed)
Patient ID: Traci Schmidt, female    DOB: 23-Apr-1981, 39 y.o.   MRN: [id number]   Chief Complaint  Patient presents with  . Advice Only    Mammary Hyperplasia: The patient is a 39 y.o. female with a history of mammary hyperplasia for several years.  She has extremely large breasts causing symptoms that include the following: Back pain in the upper and lower back, including neck pain. She pulls or pins her bra straps to provide better lift and relief of the pressure and pain. She notices relief by holding her breast up manually.  Her shoulder straps cause grooves and pain and pressure that requires padding for relief. Pain medication is sometimes required with motrin and tylenol.  Activities that are hindered by enlarged breasts include: exercise and running.  She has tried supportive clothing as well as fitted bras without improvement.  Her breasts are extremely large and fairly symmetric.  She has hyperpigmentation of the inframammary area on both sides.  The sternal to nipple distance on the right is 29 cm and the left is 29 cm.  The IMF distance is 14 cm.  She is 5 feet 2 inches tall and weighs 184 pounds.  Preoperative bra size = 38-40 DD cup. She would like to be a B cup.  The estimated excess breast tissue to be removed at the time of surgery = 575 grams on the left and 575 grams on the right.  Mammogram history: 8/21.  She had a biopsy of a lump that was negative.  Family history of breast cancer:  Mom with breast cancer.  Tobacco use:  None.  She is not a diabetic.   The patient complains of rashes in her folds.  She had a spinal fusion in July 2020.  She had extensive physical therapy at that time.    Review of Systems  Constitutional: Positive for activity change. Negative for appetite change.  HENT: Negative.   Eyes: Negative.   Respiratory: Negative.  Negative for chest tightness and shortness of breath.   Cardiovascular: Negative for leg swelling.  Endocrine:  Negative.   Genitourinary: Negative.   Musculoskeletal: Positive for back pain and neck pain.  Neurological: Negative.   Hematological: Negative.   Psychiatric/Behavioral: Negative.     Past Medical History:  Diagnosis Date  . Abnormal uterine bleeding   . Allergy    seasonal  . Fibroid   . Gallstones     Past Surgical History:  Procedure Laterality Date  . BALLOON DILATION N/A 11/12/2013   Procedure: BALLOON DILATION WITH STONE EXTRACTION;  Surgeon: Daneil Dolin, MD;  Location: [facility];  Service: Endoscopy;  Laterality: N/A;  . CESAREAN SECTION    . CESAREAN SECTION N/A    Phreesia 01/13/2020  . CHOLECYSTECTOMY N/A 11/05/2013   Dr. Arnoldo Morale  . ERCP N/A 11/12/2013   Procedure: ENDOSCOPIC RETROGRADE CHOLANGIOPANCREATOGRAPHY (ERCP), SPHINCTEROTOMY, BALLOON DILATION WITH STONE EXTRACTION;  Surgeon: Daneil Dolin, MD;  Location: [facility];  Service: Endoscopy;  Laterality: N/A;  . FRACTURE SURGERY N/A    [facility]reesia 01/13/2020  . PELVIC FRACTURE SURGERY    . SPHINCTEROTOMY N/A 11/12/2013   Procedure: SPHINCTEROTOMY;  Surgeon: Daneil Dolin, MD;  Location: [facility];  Service: Endoscopy;  Laterality: N/A;      Current Outpatient Medications:  .  pregabalin (LYRICA) 25 MG capsule, Take 25 mg by mouth at bedtime. , Disp: , Rfl:    Objective:   Vitals:   04/13/20 1341  BP:  123/82  Pulse: 79  Temp: 98.4 F (36.9 C)  SpO2: 100%    Physical Exam Vitals and nursing note reviewed.  Constitutional:      Appearance: Normal appearance.  HENT:     Head: Normocephalic and atraumatic.  Eyes:     Extraocular Movements: Extraocular movements intact.  Cardiovascular:     Rate and Rhythm: Normal rate.     Pulses: Normal pulses.  Pulmonary:     Effort: Pulmonary effort is normal.  Abdominal:     General: Abdomen is flat. There is no distension.     Tenderness: There is no abdominal tenderness.  Skin:    General: Skin is warm.     Capillary Refill: Capillary refill takes less than 2  seconds.  Neurological:     General: No focal deficit present.     Mental Status: She is alert and oriented to person, place, and time.  Psychiatric:        Mood and Affect: Mood normal.        Behavior: Behavior normal.        Thought Content: Thought content normal.     Assessment & Plan:  Lumbosacral radiculopathy at L5  Neck pain  Symptomatic mammary hypertrophy  Fusion of spine of lumbar region  The patient is a good candidate for breast reduction with possible liposuction.  We will get her records for her mammogram, PT and chiropractics.  We will also do a referral to the [facility].  The patient is usually around 170 but due to hormone therapy she is putting on weight.  She has dietary restrictions that also make it a little bit harder for her to lose weight.  Pictures were obtained of the patient and placed in the chart with the patient's or guardian's permission.  Gila, DO

## 2020-04-15 DIAGNOSIS — R102 Pelvic and perineal pain: Secondary | ICD-10-CM | POA: Diagnosis not present

## 2020-04-15 DIAGNOSIS — M25552 Pain in left hip: Secondary | ICD-10-CM | POA: Diagnosis not present

## 2020-04-17 DIAGNOSIS — M2170 Unequal limb length (acquired), unspecified site: Secondary | ICD-10-CM | POA: Insufficient documentation

## 2020-04-26 DIAGNOSIS — X58XXXA Exposure to other specified factors, initial encounter: Secondary | ICD-10-CM | POA: Diagnosis not present

## 2020-04-26 DIAGNOSIS — M955 Acquired deformity of pelvis: Secondary | ICD-10-CM | POA: Diagnosis not present

## 2020-04-26 DIAGNOSIS — M25552 Pain in left hip: Secondary | ICD-10-CM | POA: Diagnosis not present

## 2020-04-26 DIAGNOSIS — S73192A Other sprain of left hip, initial encounter: Secondary | ICD-10-CM | POA: Diagnosis not present

## 2020-04-26 DIAGNOSIS — S329XXK Fracture of unspecified parts of lumbosacral spine and pelvis, subsequent encounter for fracture with nonunion: Secondary | ICD-10-CM | POA: Diagnosis not present

## 2020-04-26 DIAGNOSIS — S73102A Unspecified sprain of left hip, initial encounter: Secondary | ICD-10-CM | POA: Diagnosis not present

## 2020-04-29 DIAGNOSIS — S73192D Other sprain of left hip, subsequent encounter: Secondary | ICD-10-CM | POA: Diagnosis not present

## 2020-05-02 DIAGNOSIS — S73192D Other sprain of left hip, subsequent encounter: Secondary | ICD-10-CM | POA: Insufficient documentation

## 2020-05-19 HISTORY — PX: BREAST REDUCTION SURGERY: SHX8

## 2020-06-02 DIAGNOSIS — M174 Other bilateral secondary osteoarthritis of knee: Secondary | ICD-10-CM | POA: Diagnosis not present

## 2020-06-02 DIAGNOSIS — M25461 Effusion, right knee: Secondary | ICD-10-CM | POA: Diagnosis not present

## 2020-06-02 DIAGNOSIS — M170 Bilateral primary osteoarthritis of knee: Secondary | ICD-10-CM | POA: Diagnosis not present

## 2020-06-02 DIAGNOSIS — G8929 Other chronic pain: Secondary | ICD-10-CM | POA: Diagnosis not present

## 2020-06-02 DIAGNOSIS — M25552 Pain in left hip: Secondary | ICD-10-CM | POA: Diagnosis not present

## 2020-06-02 DIAGNOSIS — M25462 Effusion, left knee: Secondary | ICD-10-CM | POA: Diagnosis not present

## 2020-06-03 DIAGNOSIS — M174 Other bilateral secondary osteoarthritis of knee: Secondary | ICD-10-CM | POA: Insufficient documentation

## 2020-06-07 ENCOUNTER — Ambulatory Visit (INDEPENDENT_AMBULATORY_CARE_PROVIDER_SITE_OTHER): Payer: Self-pay | Admitting: Surgical

## 2020-06-07 ENCOUNTER — Encounter: Payer: Self-pay | Admitting: Surgical

## 2020-06-07 ENCOUNTER — Other Ambulatory Visit: Payer: Self-pay

## 2020-06-07 ENCOUNTER — Encounter: Payer: BC Managed Care – PPO | Admitting: Surgical

## 2020-06-07 VITALS — BP 121/77 | HR 79 | Temp 97.8°F | Wt 182.6 lb

## 2020-06-07 DIAGNOSIS — N62 Hypertrophy of breast: Secondary | ICD-10-CM

## 2020-06-07 DIAGNOSIS — M5417 Radiculopathy, lumbosacral region: Secondary | ICD-10-CM

## 2020-06-07 DIAGNOSIS — Z719 Counseling, unspecified: Secondary | ICD-10-CM

## 2020-06-07 DIAGNOSIS — M542 Cervicalgia: Secondary | ICD-10-CM

## 2020-06-07 MED ORDER — ONDANSETRON HCL 4 MG PO TABS: 4.0000 mg | ORAL_TABLET | Freq: Three times a day (TID) | ORAL | 0 refills | Status: DC | PRN

## 2020-06-07 MED ORDER — CEPHALEXIN 500 MG PO CAPS: 500.0000 mg | ORAL_CAPSULE | Freq: Four times a day (QID) | ORAL | 0 refills | Status: AC

## 2020-06-07 MED ORDER — HYDROCODONE-ACETAMINOPHEN 5-325 MG PO TABS
1.0000 | ORAL_TABLET | Freq: Four times a day (QID) | ORAL | 0 refills | Status: AC | PRN
Start: 2020-06-07 — End: 2020-06-12

## 2020-06-07 NOTE — Progress Notes (Signed)
Patient ID: Traci Schmidt, female    DOB: 1980/09/12, 39 y.o.   MRN: [id number]  Chief Complaint  Patient presents with  . Pre-op Exam      ICD-10-CM   1. Lumbosacral radiculopathy at L5  M54.17   2. Neck pain  M54.2   3. Symptomatic mammary hypertrophy  N62     History of Present Illness: Traci Schmidt is a 39 y.o.  female  with a history of macromastia.  She presents for preoperative evaluation for upcoming procedure, bilateral breast reduction, scheduled for 06/17/2020 with Dr. Marla Roe  The patient has not had problems with anesthesia. No history of DVT/PE.  No family history of DVT/PE.  No family or personal history of bleeding or clotting disorders.  Patient is not currently taking any blood thinners.  No history of CVA/MI.   Summary of Previous Visit: STN on the right is 29 cm and left is 29 cm.  IMF is 14 cm.  She is 5 feet 2 inches tall and weighs 184 pounds.  She would like to be a B cup.  Preop bra size equals 38-40 DD cup.  Estimated excess breast tissue to remove the time of surgery equals 575 g on the left and 575 g on the right.  She had a history of a mammogram on 821.  She has history of a biopsy of the lump that was negative.  Mom with history of breast cancer.  She is not a tobacco user.  She is not a diabetic.   Past Medical History: Allergies: No Known Allergies  Current Medications:  Current Outpatient Medications:  .  acetaminophen (TYLENOL) 650 MG CR tablet, Take by mouth., Disp: , Rfl:  .  pregabalin (LYRICA) 25 MG capsule, Take 25 mg by mouth at bedtime. , Disp: , Rfl:  .  cephALEXin (KEFLEX) 500 MG capsule, Take 1 capsule (500 mg total) by mouth 4 (four) times daily for 3 days., Disp: 12 capsule, Rfl: 0 .  HYDROcodone-acetaminophen (NORCO) 5-325 MG tablet, Take 1 tablet by mouth every 6 (six) hours as needed for up to 5 days for severe pain., Disp: 20 tablet, Rfl: 0 .  ondansetron (ZOFRAN) 4 MG tablet, Take 1 tablet (4 mg  total) by mouth every 8 (eight) hours as needed for nausea or vomiting., Disp: 20 tablet, Rfl: 0  Past Medical Problems: Past Medical History:  Diagnosis Date  . Abnormal uterine bleeding   . Allergy    seasonal  . Fibroid   . Gallstones     Past Surgical History: Past Surgical History:  Procedure Laterality Date  . BALLOON DILATION N/A 11/12/2013   Procedure: BALLOON DILATION WITH STONE EXTRACTION;  Surgeon: Daneil Dolin, MD;  Location: [facility];  Service: Endoscopy;  Laterality: N/A;  . CESAREAN SECTION    . CESAREAN SECTION N/A    Phreesia 01/13/2020  . CHOLECYSTECTOMY N/A 11/05/2013   Dr. Arnoldo Morale  . ERCP N/A 11/12/2013   Procedure: ENDOSCOPIC RETROGRADE CHOLANGIOPANCREATOGRAPHY (ERCP), SPHINCTEROTOMY, BALLOON DILATION WITH STONE EXTRACTION;  Surgeon: Daneil Dolin, MD;  Location: [facility];  Service: Endoscopy;  Laterality: N/A;  . FRACTURE SURGERY N/A    [facility]reesia 01/13/2020  . PELVIC FRACTURE SURGERY    . SPHINCTEROTOMY N/A 11/12/2013   Procedure: SPHINCTEROTOMY;  Surgeon: Daneil Dolin, MD;  Location: [facility];  Service: Endoscopy;  Laterality: N/A;    Social History: Social History   Socioeconomic History  . Marital status: Married    Spouse name:  Not on file  . Number of children: 3  . Years of education: Not on file  . Highest education level: Not on file  Occupational History  . Not on file  Tobacco Use  . Smoking status: Never Smoker  . Smokeless tobacco: Never Used  Vaping Use  . Vaping Use: Never used  Substance and Sexual Activity  . Alcohol use: No  . Drug use: No  . Sexual activity: Yes    Birth control/protection: I.U.D.  Other Topics Concern  . Not on file  Social History Narrative  . Not on file   Social Determinants of Health   Financial Resource Strain: Not on file  Food Insecurity: Not on file  Transportation Needs: Not on file  Physical Activity: Not on file  Stress: Not on file  Social Connections: Not on file  Intimate Partner  Violence: Not At Risk  . Fear of Current or Ex-Partner: No  . Emotionally Abused: No  . Physically Abused: No  . Sexually Abused: No    Family History: Family History  Problem Relation Age of Onset  . Cancer Mother        breast  . Diabetes Mother   . Hypertension Mother   . Breast cancer Mother 67  . Hyperlipidemia Father   . Diabetes Father   . Diabetes Brother   . Hyperlipidemia Brother   . Hyperlipidemia Brother   . Colon cancer Neg Hx     Review of Systems: Review of Systems  Constitutional: Negative.   Respiratory: Negative.   Cardiovascular: Negative.   Gastrointestinal: Negative.   Neurological: Negative.     Physical Exam: Vital Signs BP 121/77 (BP Location: Left Arm, Patient Position: Sitting, Cuff Size: Normal)   Pulse 79   Temp 97.8 F (36.6 C) (Oral)   Wt 182 lb 9.6 oz (82.8 kg)   SpO2 100%   BMI 33.40 kg/m   Physical Exam Constitutional:      General: She is not in acute distress.    Appearance: Normal appearance. She is not ill-appearing.  HENT:     Head: Normocephalic and atraumatic.  Eyes:     Pupils: Pupils are equal, round Neck:     Musculoskeletal: Normal range of motion.  Cardiovascular:     Rate and Rhythm: Normal rate and regular rhythm.     Pulses: Normal pulses.     Heart sounds: Normal heart sounds. No murmur.  Pulmonary:     Effort: Pulmonary effort is normal. No respiratory distress.     Breath sounds: Normal breath sounds. No wheezing.  Abdominal:     General: Abdomen is flat. There is no distension.  Musculoskeletal: Normal range of motion.  Skin:    General: Skin is warm and dry.  Neurological:     General: No focal deficit present.     Mental Status: She is alert and oriented to person, place, and time. Mental status is at baseline.     Motor: No weakness.  Psychiatric:        Mood and Affect: Mood normal.        Behavior: Behavior normal.    Assessment/Plan: The patient is scheduled for bilateral breast  reduction with Dr. Marla Roe.  Risks, benefits, and alternatives of procedure discussed, questions answered and consent obtained.    **Patient reports has a history of possible reaction to adhesives.  She reports she is fine with Steri-Strips.  She reports she would like to be as small as possible.  Smoking Status:  non smoker; Counseling Given? NA Last Mammogram: 02/06/2020, underwent biopsy on 04/07/2020, negative for malignancy.  Caprini Score: 4, moderate; Risk Factors include: BMI greater than 25, Mirena IUD (progestin only, low risk of increased risk) and length of planned surgery. Recommendation for mechanical prophylaxis during surgery. Encourage early ambulation.  Pictures obtained:@[email or web address]   Post-op Rx sent to pharmacy: Norco, Zofran, Keflex  Patient was provided with the breast reduction and General Surgical Risk consent document and Pain Medication Agreement prior to their appointment.  They had adequate time to read through the risk consent documents and Pain Medication Agreement. We also discussed them in person together during this preop appointment. All of their questions were answered to their satisfaction.  Recommended calling if they have any further questions.  Risk consent form and Pain Medication Agreement to be scanned into patient's chart.  The risk that can be encountered with breast reduction were discussed and include the following but not limited to these:  Breast asymmetry, fluid accumulation, firmness of the breast, inability to breast feed, loss of nipple or areola, skin loss, decrease or no nipple sensation, fat necrosis of the breast tissue, bleeding, infection, healing delay.  There are risks of anesthesia, changes to skin sensation and injury to nerves or blood vessels.  The muscle can be temporarily or permanently injured.  You may have an allergic reaction to tape, suture, glue, blood products which can result in skin discoloration, swelling, pain, skin lesions, poor  healing.  Any of these can lead to the need for revisonal surgery or stage procedures.  A reduction has potential to interfere with diagnostic procedures.  Nipple or breast piercing can increase risks of infection.  This procedure is best done when the breast is fully developed.  Changes in the breast will continue to occur over time.  Pregnancy can alter the outcomes of previous breast reduction surgery, weight gain and weigh loss can also effect the long term appearance.     Electronically signed by: Carola Rhine Traci Leaton, PA-C 06/07/2020 10:12 AM

## 2020-06-17 ENCOUNTER — Other Ambulatory Visit: Payer: Self-pay | Admitting: Plastic Surgery

## 2020-06-18 ENCOUNTER — Telehealth: Payer: Self-pay

## 2020-06-18 ENCOUNTER — Other Ambulatory Visit: Payer: Self-pay | Admitting: Surgical

## 2020-06-18 MED ORDER — PROMETHAZINE HCL 12.5 MG PO TABS: 12.5000 mg | ORAL_TABLET | Freq: Four times a day (QID) | ORAL | 0 refills | Status: DC | PRN

## 2020-06-18 NOTE — Telephone Encounter (Signed)
Patient called to say that she had surgery yesterday with Dr. Ulice Bold.  She is throwing up anything that she eats or drinks and the zofran is not helping.  Also, she would like to know if it's ok to sleep on her side.  Please call.

## 2020-06-18 NOTE — Progress Notes (Signed)
Patient called nursing staff noting that she was having some nausea and vomiting, reported that Zofran was not providing much relief.  Prescription for Phenergan sent to pharmacy.

## 2020-06-18 NOTE — Telephone Encounter (Addendum)
Returned patients call. LMVM, advised Susy Frizzle has sent in promethazine to her pharmacy to help with the nausea and vomiting. Try to avoid taking hydrocodone if possible as it may be what is it making her sick.  Alternate taking ibuprofen and tylenol for pain for now. Drink plenty of fluids to avoid dehydration.  Avoid sleeping on the side, only the back.

## 2020-06-21 DIAGNOSIS — Z9889 Other specified postprocedural states: Secondary | ICD-10-CM | POA: Insufficient documentation

## 2020-06-21 NOTE — Progress Notes (Signed)
   Subjective:     Patient ID: Traci Schmidt, female    DOB: [birth year], 40 y.o.   MRN: [id number]  Chief Complaint  Patient presents with  . Post-op Follow-up    HPI: The patient is a 40 y.o. female here for follow-up after undergoing bilateral breast reduction on 06/17/2020 with Dr. Ulice Bold. @ [facility]  ~ 1 week PO Patient reports overall she is doing very well.  Reports she has a little bit of tiredness and occasional nausea.  No other complaints.  Denies fever/chills/vomiting and breast pain.  Reports she had removed the bandages because they had become wet in the shower.  Denies drainage.  Review of Systems  Constitutional: Positive for fatigue. Negative for chills and fever.  Respiratory: Negative for shortness of breath.   Cardiovascular: Negative for chest pain and leg swelling.  Gastrointestinal: Positive for nausea. Negative for constipation, diarrhea and vomiting.  Skin: Negative for color change, pallor, rash and wound.     Objective:   Vital Signs BP 125/89 (BP Location: Left Arm, Patient Position: Sitting, Cuff Size: Normal)   Pulse 97   SpO2 99%  Vital Signs and Nursing Note Reviewed  Physical Exam Constitutional:      General: She is not in acute distress.    Appearance: Normal appearance. She is not ill-appearing.  HENT:     Head: Normocephalic and atraumatic.  Eyes:     Extraocular Movements: Extraocular movements intact and EOM normal.  Pulmonary:     Effort: Pulmonary effort is normal.  Chest:     Comments: Bilateral incisions are healing very nicely, C/D/I.  Small amount of Dermabond present.  No signs of infection, redness, drainage, seroma/hematoma.  Bilateral breasts are soft to palpation. Musculoskeletal:        General: Normal range of motion.     Cervical back: Normal range of motion.  Skin:    General: Skin is warm and dry.     Coloration: Skin is not pale.     Findings: No erythema or rash.  Neurological:     Mental Status: She  is alert and oriented to person, place, and time.     Gait: Gait is intact.  Psychiatric:        Mood and Affect: Mood and affect normal.        Behavior: Behavior normal.        Thought Content: Thought content normal.        Cognition and Memory: Memory normal.        Judgment: Judgment normal.       Assessment/Plan:     ICD-10-CM   1. S/P bilateral breast reduction  Z98.890    Overall patient is doing very well.  Bilateral breast incisions are healing very nicely, C/D/I.  No signs of infection, redness, drainage, seroma/hematoma.  Suspect tiredness may be a combination of taking the Phenergan, pain medication, and postsurgery/anesthesia.  Continue to wear compression garment 24/7 until 6 weeks postop.  Continue to avoid heavy lifting and vigorous activity.  Next postop appointment is scheduled for next week.  Return precautions provided.  Call office with any questions/concerns.  Eldridge Abrahams, PA-C 06/25/2020, 2:40 PM

## 2020-06-22 ENCOUNTER — Telehealth: Payer: Self-pay | Admitting: Plastic Surgery

## 2020-06-22 NOTE — Telephone Encounter (Signed)
Patient called to find out if she was supposed to remove the tape that is on her incisions or leave them on. he instructions only mentioned the gauze being changed. Please call her to advise.

## 2020-06-22 NOTE — Telephone Encounter (Signed)
Returned patients call. Advised her not to remove the foam bandage off the incision to avoid wound opening. Follow up appointment is 06/25/2020 at 1:40. Patient understood and agreed.

## 2020-06-25 ENCOUNTER — Ambulatory Visit (INDEPENDENT_AMBULATORY_CARE_PROVIDER_SITE_OTHER): Payer: BC Managed Care – PPO | Admitting: Plastic Surgery

## 2020-06-25 ENCOUNTER — Encounter: Payer: Self-pay | Admitting: Plastic Surgery

## 2020-06-25 ENCOUNTER — Encounter: Payer: BC Managed Care – PPO | Admitting: Plastic Surgery

## 2020-06-25 ENCOUNTER — Other Ambulatory Visit: Payer: Self-pay

## 2020-06-25 VITALS — BP 125/89 | HR 97

## 2020-06-25 DIAGNOSIS — Z9889 Other specified postprocedural states: Secondary | ICD-10-CM

## 2020-06-25 MED ORDER — PROMETHAZINE HCL 12.5 MG PO TABS: 12.5000 mg | ORAL_TABLET | Freq: Four times a day (QID) | ORAL | 0 refills | Status: DC | PRN

## 2020-07-02 ENCOUNTER — Encounter: Payer: BC Managed Care – PPO | Admitting: Plastic Surgery

## 2020-07-02 DIAGNOSIS — Z01818 Encounter for other preprocedural examination: Secondary | ICD-10-CM | POA: Diagnosis not present

## 2020-07-02 DIAGNOSIS — X58XXXD Exposure to other specified factors, subsequent encounter: Secondary | ICD-10-CM | POA: Diagnosis not present

## 2020-07-02 DIAGNOSIS — E669 Obesity, unspecified: Secondary | ICD-10-CM | POA: Diagnosis not present

## 2020-07-02 DIAGNOSIS — M25552 Pain in left hip: Secondary | ICD-10-CM | POA: Diagnosis not present

## 2020-07-02 DIAGNOSIS — S329XXK Fracture of unspecified parts of lumbosacral spine and pelvis, subsequent encounter for fracture with nonunion: Secondary | ICD-10-CM | POA: Diagnosis not present

## 2020-07-02 DIAGNOSIS — S73192D Other sprain of left hip, subsequent encounter: Secondary | ICD-10-CM | POA: Diagnosis not present

## 2020-07-02 DIAGNOSIS — N938 Other specified abnormal uterine and vaginal bleeding: Secondary | ICD-10-CM | POA: Diagnosis not present

## 2020-07-06 ENCOUNTER — Ambulatory Visit: Payer: BC Managed Care – PPO | Admitting: Obstetrics and Gynecology

## 2020-07-07 ENCOUNTER — Ambulatory Visit (INDEPENDENT_AMBULATORY_CARE_PROVIDER_SITE_OTHER): Payer: BC Managed Care – PPO | Admitting: Surgical

## 2020-07-07 ENCOUNTER — Other Ambulatory Visit: Payer: Self-pay

## 2020-07-07 ENCOUNTER — Encounter: Payer: Self-pay | Admitting: Surgical

## 2020-07-07 VITALS — BP 117/79 | HR 94

## 2020-07-07 DIAGNOSIS — Z9889 Other specified postprocedural states: Secondary | ICD-10-CM

## 2020-07-07 NOTE — Progress Notes (Signed)
Patient is a 40 year old female here for follow-up after undergoing bilateral breast reduction with Dr. Marla Roe on 06/17/2020.  Patient reports overall she is doing well, she has some itching of her bilateral breast and some tenderness along the axillary region of bilateral breast incisions. She reports otherwise she is doing well. She reports she is scheduled for an orthopedic surgery at the beginning of next month. She is not having any other changes or issues.  Chaperone present on exam On exam bilateral breast incisions intact, bilateral NAC's are viable. No rashes or wounds noted. She does have some dried skin but no other skin changes noted. No erythema noted. There is some swelling present, however no clear fluid wave noted with palpation.  Recommend continue her compressive garment 24/7 for a total of 6 weeks after surgery. Continue to avoid strenuous activity. Continue to avoid heavy lifting. We discussed scheduling an additional follow-up in 3 weeks for reevaluation. She is scheduled for orthopedic surgery 1 Feb. I recommend she call with any questions or concerns. There is no sign of hematoma or infection. There is a possible seroma formation, but it was not very evident and we elected to allow fluid/swelling to resolve on its own and to reevaluate in 3 weeks. Recommend continuing compressive garments to assist with this.

## 2020-07-15 DIAGNOSIS — S73192D Other sprain of left hip, subsequent encounter: Secondary | ICD-10-CM | POA: Diagnosis not present

## 2020-07-16 ENCOUNTER — Encounter: Payer: BC Managed Care – PPO | Admitting: Plastic Surgery

## 2020-07-19 DIAGNOSIS — M25852 Other specified joint disorders, left hip: Secondary | ICD-10-CM | POA: Diagnosis not present

## 2020-07-19 DIAGNOSIS — Z975 Presence of (intrauterine) contraceptive device: Secondary | ICD-10-CM | POA: Diagnosis not present

## 2020-07-19 DIAGNOSIS — Z79899 Other long term (current) drug therapy: Secondary | ICD-10-CM | POA: Diagnosis not present

## 2020-07-19 DIAGNOSIS — M25552 Pain in left hip: Secondary | ICD-10-CM | POA: Diagnosis not present

## 2020-07-19 DIAGNOSIS — M24152 Other articular cartilage disorders, left hip: Secondary | ICD-10-CM | POA: Diagnosis not present

## 2020-07-19 DIAGNOSIS — Z6832 Body mass index (BMI) 32.0-32.9, adult: Secondary | ICD-10-CM | POA: Diagnosis not present

## 2020-07-19 DIAGNOSIS — M170 Bilateral primary osteoarthritis of knee: Secondary | ICD-10-CM | POA: Diagnosis not present

## 2020-07-19 DIAGNOSIS — G8929 Other chronic pain: Secondary | ICD-10-CM | POA: Diagnosis not present

## 2020-07-19 DIAGNOSIS — E669 Obesity, unspecified: Secondary | ICD-10-CM | POA: Diagnosis not present

## 2020-07-20 HISTORY — PX: HIP SURGERY: SHX245

## 2020-08-08 NOTE — Progress Notes (Signed)
Patient is a 40 year old female here for follow-up after undergoing bilateral breast reduction on 06/17/2020 with Dr. Marla Roe at [facility].  ~ 8 weeks PO Patient reports she is doing very well.  Denies fever/chills, nausea/vomiting, breast pain.  Bilateral incisions of healed very nicely, C/D/I.  No signs of infection, redness, drainage.  Bilateral breasts are soft to palpation no signs of hematoma/seroma.  She may resume normal activities as tolerated.  She no longer needs to wear sports bra at night.  May shower normally.  Follow-up as needed.  Return precautions provided.  Call office with any questions/concerns.  She is very pleased with her results.  Pictures were obtained of the patient and placed in the chart with the patient's or guardian's permission.

## 2020-08-09 DIAGNOSIS — M25552 Pain in left hip: Secondary | ICD-10-CM | POA: Diagnosis not present

## 2020-08-09 DIAGNOSIS — M25652 Stiffness of left hip, not elsewhere classified: Secondary | ICD-10-CM | POA: Diagnosis not present

## 2020-08-09 DIAGNOSIS — R531 Weakness: Secondary | ICD-10-CM | POA: Diagnosis not present

## 2020-08-09 DIAGNOSIS — Z4789 Encounter for other orthopedic aftercare: Secondary | ICD-10-CM | POA: Diagnosis not present

## 2020-08-10 ENCOUNTER — Ambulatory Visit (INDEPENDENT_AMBULATORY_CARE_PROVIDER_SITE_OTHER): Payer: BC Managed Care – PPO | Admitting: Plastic Surgery

## 2020-08-10 ENCOUNTER — Other Ambulatory Visit: Payer: Self-pay

## 2020-08-10 ENCOUNTER — Encounter: Payer: Self-pay | Admitting: Plastic Surgery

## 2020-08-10 VITALS — BP 147/90 | HR 91

## 2020-08-10 DIAGNOSIS — Z4789 Encounter for other orthopedic aftercare: Secondary | ICD-10-CM | POA: Diagnosis not present

## 2020-08-10 DIAGNOSIS — R531 Weakness: Secondary | ICD-10-CM | POA: Diagnosis not present

## 2020-08-10 DIAGNOSIS — Z9889 Other specified postprocedural states: Secondary | ICD-10-CM

## 2020-08-10 DIAGNOSIS — M25652 Stiffness of left hip, not elsewhere classified: Secondary | ICD-10-CM | POA: Diagnosis not present

## 2020-08-10 DIAGNOSIS — M25552 Pain in left hip: Secondary | ICD-10-CM | POA: Diagnosis not present

## 2020-08-13 ENCOUNTER — Encounter: Payer: Self-pay | Admitting: Family Medicine

## 2020-08-16 DIAGNOSIS — R531 Weakness: Secondary | ICD-10-CM | POA: Diagnosis not present

## 2020-08-16 DIAGNOSIS — Z4789 Encounter for other orthopedic aftercare: Secondary | ICD-10-CM | POA: Diagnosis not present

## 2020-08-16 DIAGNOSIS — M25552 Pain in left hip: Secondary | ICD-10-CM | POA: Diagnosis not present

## 2020-08-16 DIAGNOSIS — M25652 Stiffness of left hip, not elsewhere classified: Secondary | ICD-10-CM | POA: Diagnosis not present

## 2020-08-17 DIAGNOSIS — M25652 Stiffness of left hip, not elsewhere classified: Secondary | ICD-10-CM | POA: Diagnosis not present

## 2020-08-17 DIAGNOSIS — Z4789 Encounter for other orthopedic aftercare: Secondary | ICD-10-CM | POA: Diagnosis not present

## 2020-08-17 DIAGNOSIS — R531 Weakness: Secondary | ICD-10-CM | POA: Diagnosis not present

## 2020-08-17 DIAGNOSIS — M25552 Pain in left hip: Secondary | ICD-10-CM | POA: Diagnosis not present

## 2020-08-19 DIAGNOSIS — M25652 Stiffness of left hip, not elsewhere classified: Secondary | ICD-10-CM | POA: Diagnosis not present

## 2020-08-19 DIAGNOSIS — M25552 Pain in left hip: Secondary | ICD-10-CM | POA: Diagnosis not present

## 2020-08-19 DIAGNOSIS — R531 Weakness: Secondary | ICD-10-CM | POA: Diagnosis not present

## 2020-08-19 DIAGNOSIS — Z4789 Encounter for other orthopedic aftercare: Secondary | ICD-10-CM | POA: Diagnosis not present

## 2020-08-23 DIAGNOSIS — R531 Weakness: Secondary | ICD-10-CM | POA: Diagnosis not present

## 2020-08-23 DIAGNOSIS — M25652 Stiffness of left hip, not elsewhere classified: Secondary | ICD-10-CM | POA: Diagnosis not present

## 2020-08-23 DIAGNOSIS — M25552 Pain in left hip: Secondary | ICD-10-CM | POA: Diagnosis not present

## 2020-08-23 DIAGNOSIS — Z4789 Encounter for other orthopedic aftercare: Secondary | ICD-10-CM | POA: Diagnosis not present

## 2020-08-25 DIAGNOSIS — M25552 Pain in left hip: Secondary | ICD-10-CM | POA: Diagnosis not present

## 2020-08-25 DIAGNOSIS — R531 Weakness: Secondary | ICD-10-CM | POA: Diagnosis not present

## 2020-08-25 DIAGNOSIS — Z4789 Encounter for other orthopedic aftercare: Secondary | ICD-10-CM | POA: Diagnosis not present

## 2020-08-25 DIAGNOSIS — M25652 Stiffness of left hip, not elsewhere classified: Secondary | ICD-10-CM | POA: Diagnosis not present

## 2020-08-27 DIAGNOSIS — Z4789 Encounter for other orthopedic aftercare: Secondary | ICD-10-CM | POA: Diagnosis not present

## 2020-08-27 DIAGNOSIS — M25552 Pain in left hip: Secondary | ICD-10-CM | POA: Diagnosis not present

## 2020-08-27 DIAGNOSIS — M25652 Stiffness of left hip, not elsewhere classified: Secondary | ICD-10-CM | POA: Diagnosis not present

## 2020-08-27 DIAGNOSIS — R531 Weakness: Secondary | ICD-10-CM | POA: Diagnosis not present

## 2020-08-30 DIAGNOSIS — R531 Weakness: Secondary | ICD-10-CM | POA: Diagnosis not present

## 2020-08-30 DIAGNOSIS — M25652 Stiffness of left hip, not elsewhere classified: Secondary | ICD-10-CM | POA: Diagnosis not present

## 2020-08-30 DIAGNOSIS — Z4789 Encounter for other orthopedic aftercare: Secondary | ICD-10-CM | POA: Diagnosis not present

## 2020-08-30 DIAGNOSIS — M25552 Pain in left hip: Secondary | ICD-10-CM | POA: Diagnosis not present

## 2020-09-01 DIAGNOSIS — M25552 Pain in left hip: Secondary | ICD-10-CM | POA: Diagnosis not present

## 2020-09-01 DIAGNOSIS — Z4789 Encounter for other orthopedic aftercare: Secondary | ICD-10-CM | POA: Diagnosis not present

## 2020-09-01 DIAGNOSIS — M25652 Stiffness of left hip, not elsewhere classified: Secondary | ICD-10-CM | POA: Diagnosis not present

## 2020-09-01 DIAGNOSIS — R531 Weakness: Secondary | ICD-10-CM | POA: Diagnosis not present

## 2020-09-03 DIAGNOSIS — Z4789 Encounter for other orthopedic aftercare: Secondary | ICD-10-CM | POA: Diagnosis not present

## 2020-09-03 DIAGNOSIS — M25652 Stiffness of left hip, not elsewhere classified: Secondary | ICD-10-CM | POA: Diagnosis not present

## 2020-09-03 DIAGNOSIS — R531 Weakness: Secondary | ICD-10-CM | POA: Diagnosis not present

## 2020-09-03 DIAGNOSIS — M25552 Pain in left hip: Secondary | ICD-10-CM | POA: Diagnosis not present

## 2020-09-06 DIAGNOSIS — M25552 Pain in left hip: Secondary | ICD-10-CM | POA: Diagnosis not present

## 2020-09-06 DIAGNOSIS — R531 Weakness: Secondary | ICD-10-CM | POA: Diagnosis not present

## 2020-09-06 DIAGNOSIS — M25652 Stiffness of left hip, not elsewhere classified: Secondary | ICD-10-CM | POA: Diagnosis not present

## 2020-09-06 DIAGNOSIS — Z4789 Encounter for other orthopedic aftercare: Secondary | ICD-10-CM | POA: Diagnosis not present

## 2020-09-07 DIAGNOSIS — M25552 Pain in left hip: Secondary | ICD-10-CM | POA: Diagnosis not present

## 2020-09-07 DIAGNOSIS — Z4789 Encounter for other orthopedic aftercare: Secondary | ICD-10-CM | POA: Diagnosis not present

## 2020-09-07 DIAGNOSIS — R531 Weakness: Secondary | ICD-10-CM | POA: Diagnosis not present

## 2020-09-07 DIAGNOSIS — M25652 Stiffness of left hip, not elsewhere classified: Secondary | ICD-10-CM | POA: Diagnosis not present

## 2020-09-09 DIAGNOSIS — M7072 Other bursitis of hip, left hip: Secondary | ICD-10-CM | POA: Diagnosis not present

## 2020-09-10 DIAGNOSIS — M25552 Pain in left hip: Secondary | ICD-10-CM | POA: Diagnosis not present

## 2020-09-10 DIAGNOSIS — R531 Weakness: Secondary | ICD-10-CM | POA: Diagnosis not present

## 2020-09-10 DIAGNOSIS — Z4789 Encounter for other orthopedic aftercare: Secondary | ICD-10-CM | POA: Diagnosis not present

## 2020-09-10 DIAGNOSIS — M25652 Stiffness of left hip, not elsewhere classified: Secondary | ICD-10-CM | POA: Diagnosis not present

## 2020-09-13 DIAGNOSIS — M25652 Stiffness of left hip, not elsewhere classified: Secondary | ICD-10-CM | POA: Diagnosis not present

## 2020-09-13 DIAGNOSIS — M25552 Pain in left hip: Secondary | ICD-10-CM | POA: Diagnosis not present

## 2020-09-13 DIAGNOSIS — R531 Weakness: Secondary | ICD-10-CM | POA: Diagnosis not present

## 2020-09-13 DIAGNOSIS — Z4789 Encounter for other orthopedic aftercare: Secondary | ICD-10-CM | POA: Diagnosis not present

## 2020-09-14 ENCOUNTER — Ambulatory Visit (INDEPENDENT_AMBULATORY_CARE_PROVIDER_SITE_OTHER): Payer: BC Managed Care – PPO | Admitting: Internal Medicine

## 2020-09-14 ENCOUNTER — Other Ambulatory Visit: Payer: Self-pay

## 2020-09-14 ENCOUNTER — Encounter (INDEPENDENT_AMBULATORY_CARE_PROVIDER_SITE_OTHER): Payer: Self-pay | Admitting: Internal Medicine

## 2020-09-14 VITALS — BP 122/78 | HR 77 | Temp 97.7°F | Ht 61.75 in | Wt 172.2 lb

## 2020-09-14 DIAGNOSIS — E669 Obesity, unspecified: Secondary | ICD-10-CM

## 2020-09-14 DIAGNOSIS — Z13220 Encounter for screening for lipoid disorders: Secondary | ICD-10-CM

## 2020-09-14 DIAGNOSIS — E785 Hyperlipidemia, unspecified: Secondary | ICD-10-CM | POA: Diagnosis not present

## 2020-09-14 DIAGNOSIS — R5381 Other malaise: Secondary | ICD-10-CM

## 2020-09-14 DIAGNOSIS — R5383 Other fatigue: Secondary | ICD-10-CM

## 2020-09-14 DIAGNOSIS — Z131 Encounter for screening for diabetes mellitus: Secondary | ICD-10-CM

## 2020-09-14 DIAGNOSIS — E559 Vitamin D deficiency, unspecified: Secondary | ICD-10-CM | POA: Diagnosis not present
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
## 2020-09-15 DIAGNOSIS — M25652 Stiffness of left hip, not elsewhere classified: Secondary | ICD-10-CM | POA: Diagnosis not present

## 2020-09-15 DIAGNOSIS — R531 Weakness: Secondary | ICD-10-CM | POA: Diagnosis not present

## 2020-09-15 DIAGNOSIS — Z4789 Encounter for other orthopedic aftercare: Secondary | ICD-10-CM | POA: Diagnosis not present

## 2020-09-15 DIAGNOSIS — M25552 Pain in left hip: Secondary | ICD-10-CM | POA: Diagnosis not present

## 2020-09-15 LAB — CBC
HCT: 39.2 % (ref 35.0–45.0)
Hemoglobin: 12.2 g/dL (ref 11.7–15.5)
MCH: 25.6 pg — ABNORMAL LOW (ref 27.0–33.0)
MCHC: 31.1 g/dL — ABNORMAL LOW (ref 32.0–36.0)
MCV: 82.4 fL (ref 80.0–100.0)
MPV: 9.4 fL (ref 7.5–12.5)
Platelets: 428 10*3/uL — ABNORMAL HIGH (ref 140–400)
RBC: 4.76 10*6/uL (ref 3.80–5.10)
RDW: 13.5 % (ref 11.0–15.0)
WBC: 10 10*3/uL (ref 3.8–10.8)

## 2020-09-15 LAB — TSH: TSH: 1.23 mIU/L

## 2020-09-15 LAB — HEMOGLOBIN A1C
Hgb A1c MFr Bld: 5.2 % of total Hgb (ref <5.7)
Mean Plasma Glucose: 103 mg/dL
eAG (mmol/L): 5.7 mmol/L

## 2020-09-15 LAB — COMPLETE METABOLIC PANEL WITH GFR
AG Ratio: 1.3 (calc) (ref 1.0–2.5)
ALT: 69 U/L — ABNORMAL HIGH (ref 6–29)
AST: 36 U/L — ABNORMAL HIGH (ref 10–30)
Albumin: 4.2 g/dL (ref 3.6–5.1)
Alkaline phosphatase (APISO): 73 U/L (ref 31–125)
BUN: 8 mg/dL (ref 7–25)
CO2: 27 mmol/L (ref 20–32)
Calcium: 9.6 mg/dL (ref 8.6–10.2)
Chloride: 104 mmol/L (ref 98–110)
Creat: 0.59 mg/dL (ref 0.50–1.10)
GFR, Est African American: 134 mL/min/{1.73_m2} (ref >=60)
GFR, Est Non African American: 115 mL/min/{1.73_m2} (ref >=60)
Globulin: 3.2 g/dL (calc) (ref 1.9–3.7)
Glucose, Bld: 85 mg/dL (ref 65–139)
Potassium: 4.7 mmol/L (ref 3.5–5.3)
Sodium: 139 mmol/L (ref 135–146)
Total Bilirubin: 0.5 mg/dL (ref 0.2–1.2)
Total Protein: 7.4 g/dL (ref 6.1–8.1)

## 2020-09-15 LAB — T3, FREE: T3, Free: 2.7 pg/mL (ref 2.3–4.2)

## 2020-09-15 LAB — LIPID PANEL
Cholesterol: 150 mg/dL (ref <200)
HDL: 56 mg/dL (ref >=50)
LDL Cholesterol (Calc): 79 mg/dL (calc)
Non-HDL Cholesterol (Calc): 94 mg/dL (calc) (ref <130)
Total CHOL/HDL Ratio: 2.7 (calc) (ref <5.0)
Triglycerides: 73 mg/dL (ref <150)

## 2020-09-15 LAB — VITAMIN D 25 HYDROXY (VIT D DEFICIENCY, FRACTURES): Vit D, 25-Hydroxy: 32 ng/mL (ref 30–100)

## 2020-09-15 LAB — T4, FREE: Free T4: 1.1 ng/dL (ref 0.8–1.8)

## 2020-09-17 DIAGNOSIS — M25552 Pain in left hip: Secondary | ICD-10-CM | POA: Diagnosis not present

## 2020-09-17 DIAGNOSIS — R531 Weakness: Secondary | ICD-10-CM | POA: Diagnosis not present

## 2020-09-17 DIAGNOSIS — Z4789 Encounter for other orthopedic aftercare: Secondary | ICD-10-CM | POA: Diagnosis not present

## 2020-09-17 DIAGNOSIS — M25652 Stiffness of left hip, not elsewhere classified: Secondary | ICD-10-CM | POA: Diagnosis not present

## 2020-09-20 DIAGNOSIS — Z4789 Encounter for other orthopedic aftercare: Secondary | ICD-10-CM | POA: Diagnosis not present

## 2020-09-20 DIAGNOSIS — M25652 Stiffness of left hip, not elsewhere classified: Secondary | ICD-10-CM | POA: Diagnosis not present

## 2020-09-20 DIAGNOSIS — R531 Weakness: Secondary | ICD-10-CM | POA: Diagnosis not present

## 2020-09-20 DIAGNOSIS — M25552 Pain in left hip: Secondary | ICD-10-CM | POA: Diagnosis not present

## 2020-09-22 DIAGNOSIS — M25652 Stiffness of left hip, not elsewhere classified: Secondary | ICD-10-CM | POA: Diagnosis not present

## 2020-09-22 DIAGNOSIS — M25552 Pain in left hip: Secondary | ICD-10-CM | POA: Diagnosis not present

## 2020-09-22 DIAGNOSIS — R531 Weakness: Secondary | ICD-10-CM | POA: Diagnosis not present

## 2020-09-22 DIAGNOSIS — Z4789 Encounter for other orthopedic aftercare: Secondary | ICD-10-CM | POA: Diagnosis not present

## 2020-09-27 DIAGNOSIS — M25552 Pain in left hip: Secondary | ICD-10-CM | POA: Diagnosis not present

## 2020-09-27 DIAGNOSIS — R531 Weakness: Secondary | ICD-10-CM | POA: Diagnosis not present

## 2020-09-27 DIAGNOSIS — Z4789 Encounter for other orthopedic aftercare: Secondary | ICD-10-CM | POA: Diagnosis not present

## 2020-09-27 DIAGNOSIS — M25652 Stiffness of left hip, not elsewhere classified: Secondary | ICD-10-CM | POA: Diagnosis not present

## 2020-10-01 DIAGNOSIS — M25652 Stiffness of left hip, not elsewhere classified: Secondary | ICD-10-CM | POA: Diagnosis not present

## 2020-10-01 DIAGNOSIS — R531 Weakness: Secondary | ICD-10-CM | POA: Diagnosis not present

## 2020-10-01 DIAGNOSIS — Z4789 Encounter for other orthopedic aftercare: Secondary | ICD-10-CM | POA: Diagnosis not present

## 2020-10-01 DIAGNOSIS — M25552 Pain in left hip: Secondary | ICD-10-CM | POA: Diagnosis not present

## 2020-10-04 DIAGNOSIS — M25552 Pain in left hip: Secondary | ICD-10-CM | POA: Diagnosis not present

## 2020-10-04 DIAGNOSIS — Z4789 Encounter for other orthopedic aftercare: Secondary | ICD-10-CM | POA: Diagnosis not present

## 2020-10-04 DIAGNOSIS — M25652 Stiffness of left hip, not elsewhere classified: Secondary | ICD-10-CM | POA: Diagnosis not present

## 2020-10-04 DIAGNOSIS — R531 Weakness: Secondary | ICD-10-CM | POA: Diagnosis not present

## 2020-10-15 DIAGNOSIS — M25552 Pain in left hip: Secondary | ICD-10-CM | POA: Diagnosis not present

## 2020-10-15 DIAGNOSIS — M25652 Stiffness of left hip, not elsewhere classified: Secondary | ICD-10-CM | POA: Diagnosis not present

## 2020-10-15 DIAGNOSIS — Z4789 Encounter for other orthopedic aftercare: Secondary | ICD-10-CM | POA: Diagnosis not present

## 2020-10-15 DIAGNOSIS — R531 Weakness: Secondary | ICD-10-CM | POA: Diagnosis not present

## 2020-10-18 ENCOUNTER — Encounter: Payer: Self-pay | Admitting: Obstetrics and Gynecology

## 2020-10-18 ENCOUNTER — Other Ambulatory Visit: Payer: Self-pay

## 2020-10-18 ENCOUNTER — Ambulatory Visit (INDEPENDENT_AMBULATORY_CARE_PROVIDER_SITE_OTHER): Payer: BC Managed Care – PPO | Admitting: Obstetrics and Gynecology

## 2020-10-18 VITALS — BP 122/78 | HR 88 | Ht 61.0 in | Wt 165.0 lb

## 2020-10-18 DIAGNOSIS — Z975 Presence of (intrauterine) contraceptive device: Secondary | ICD-10-CM

## 2020-10-18 DIAGNOSIS — R109 Unspecified abdominal pain: Secondary | ICD-10-CM

## 2020-10-18 DIAGNOSIS — M25552 Pain in left hip: Secondary | ICD-10-CM | POA: Diagnosis not present

## 2020-10-18 DIAGNOSIS — Z4789 Encounter for other orthopedic aftercare: Secondary | ICD-10-CM | POA: Diagnosis not present

## 2020-10-18 DIAGNOSIS — M25652 Stiffness of left hip, not elsewhere classified: Secondary | ICD-10-CM | POA: Diagnosis not present

## 2020-10-18 DIAGNOSIS — R531 Weakness: Secondary | ICD-10-CM | POA: Diagnosis not present

## 2020-10-18 DIAGNOSIS — N921 Excessive and frequent menstruation with irregular cycle: Secondary | ICD-10-CM

## 2020-10-18 MED ORDER — NORETHIN ACE-ETH ESTRAD-FE 1-20 MG-MCG PO TABS
1.0000 | ORAL_TABLET | Freq: Every day | ORAL | 0 refills | Status: DC
Start: 2020-10-18 — End: 2020-11-08

## 2020-10-18 NOTE — Patient Instructions (Signed)
Oral Contraception Information Oral contraceptive pills (OCPs) are medicines taken by mouth to prevent pregnancy. They work by:  Preventing the ovaries from releasing eggs.  Thickening mucus in the lower part of the uterus (cervix). This prevents sperm from entering the uterus.  Thinning the lining of the uterus (endometrium). This prevents a fertilized egg from attaching to the endometrium. OCPs are highly effective when taken exactly as prescribed. However, OCPs do not prevent STIs (sexually transmitted infections). Using condoms while on an OCP can help prevent STIs. What happens before starting OCPs? Before you start taking OCPs:  You may have a physical exam, blood test, and Pap test.  Your health care provider will make sure you are a good candidate for oral contraception. OCPs are not a good option for certain women, such as: ? Women who smoke and are older than age 35. ? Women who have or have had certain conditions, such as:  A history of high blood pressure.  Deep vein thrombosis.  Pulmonary embolism.  Stroke.  Cardiovascular disease.  Peripheral vascular disease. Ask your health care provider about the possible side effects of the OCP you may be prescribed. Be aware that it can take 2-3 months for your body to adjust to changes in hormone levels. Types of oral contraception Birth control pills contain the hormones estrogen and progestin (synthetic progesterone) or progestin only. The combination pill This type of pill contains estrogen and progestin hormones.  Conventional contraception pills come in packs of 21 or 28 pills. ? Some packs with 28-day pills contain estrogen and progestin for the first 21-24 days. Hormone-free tablets, called placebos, are taken for the final 4-7 days. You should have menstrual bleeding during the time you take the placebos. ? In packs with 21 tablets, you take no pills for 7 days. Menstrual bleeding occurs during these days. (Some people  prefer taking a pill for 28 days to help establish a routine).  Extended-interval contraception pills come in packs of 91 pills. The first 84 tablets have both estrogen and progestin. The last 7 pills are placebos. Menstrual bleeding occurs during the placebo days. With this schedule, menstrual bleeding happens once every 3 months.  Continuous contraception pills come in packs of 28 pills. All pills in the pack contain estrogen and progestin. With this schedule, regular menstrual bleeding does not happen, but there may be spotting or irregular bleeding. Progestin-only pills This type of pill is often called the mini-pill and contains the progestin hormone only. It comes in packs of 28 pills. In some packs, the last 4 pills are placebos. The pill must be taken at the same time every day. This is very important to prevent pregnancy. Menstrual bleeding may not be regular or predictable.   What are the advantages? Oral contraception provides reliable and continuous contraception if taken as directed. It may treat or decrease symptoms of:  Menstrual period cramps.  Irregular menstrual cycle or bleeding.  Heavy menstrual flow.  Abnormal uterine bleeding.  Acne, depending on the type of pill.  Polycystic ovarian syndrome (POS).  Endometriosis.  Iron deficiency anemia.  Premenstrual symptoms, including severe irritability, depression, or anxiety. It also may:  Reduce the risk of endometrial and ovarian cancer.  Be used as emergency contraception.  Prevent ectopic pregnancies and infections of the fallopian tubes. What can make OCPs less effective? OCPs may be less effective if:  You forget to take the pill every day. For progestin-only pills, it is especially important to take the pill at the   same time each day. Even taking it 3 hours late can increase the risk of pregnancy.  You have a stomach or intestinal disease that reduces your body's ability to absorb the pill.  You take OCPs  with other medicines that make OCPs less effective, such as antibiotics, certain HIV medicines, and some seizure medicines.  You take expired OCPs.  You forget to restart the pill after 7 days of not taking it. This refers to the packs of 21 pills. What are the side effects and risks? OCPs can sometimes cause side effects, such as:  Headache.  Depression.  Trouble sleeping.  Nausea and vomiting.  Breast tenderness.  Irregular bleeding or spotting during the first several months.  Bloating or fluid retention.  Increase in blood pressure. Combination pills may slightly increase the risk of:  Blood clots.  Heart attack.  Stroke. Follow these instructions at home: Follow instructions from your health care provider about how to start taking your first cycle of OCPs. Depending on when you start the pill, you may need to use a backup form of birth control, such as condoms, during the first week. Make sure you know what steps to take if you forget to take the pill. Summary  Oral contraceptive pills (OCPs) are medicines taken by mouth to prevent pregnancy. They are highly effective when taken exactly as prescribed.  OCPs contain a combination of the hormones estrogen and progestin (synthetic progesterone) or progestin only.  Before you start taking the pill, you may have a physical exam, blood test, and Pap test. Your health care provider will make sure you are a good candidate for oral contraception.  The combination pill may come in a 21-day pack, a 28-day pack, or a 91-day pack. Progestin-only pills come in packs of 28 pills.  OCPs can sometimes cause side effects, such as headache, nausea, breast tenderness, or irregular bleeding. This information is not intended to replace advice given to you by your health care provider. Make sure you discuss any questions you have with your health care provider. Document Revised: 03/05/2020 Document Reviewed: 02/12/2020 Elsevier Patient  Education  2021 Elsevier Inc.  
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
## 2020-10-19 ENCOUNTER — Encounter (INDEPENDENT_AMBULATORY_CARE_PROVIDER_SITE_OTHER): Payer: Self-pay | Admitting: Internal Medicine

## 2020-10-19 ENCOUNTER — Ambulatory Visit (INDEPENDENT_AMBULATORY_CARE_PROVIDER_SITE_OTHER): Payer: BC Managed Care – PPO | Admitting: Internal Medicine

## 2020-10-19 VITALS — BP 116/74 | HR 78 | Temp 97.5°F | Ht 61.0 in | Wt 165.6 lb

## 2020-10-19 DIAGNOSIS — R5381 Other malaise: Secondary | ICD-10-CM

## 2020-10-19 DIAGNOSIS — L659 Nonscarring hair loss, unspecified: Secondary | ICD-10-CM | POA: Diagnosis not present

## 2020-10-19 DIAGNOSIS — R6889 Other general symptoms and signs: Secondary | ICD-10-CM

## 2020-10-19 DIAGNOSIS — R5383 Other fatigue: Secondary | ICD-10-CM

## 2020-10-19 DIAGNOSIS — E559 Vitamin D deficiency, unspecified: Secondary | ICD-10-CM | POA: Diagnosis not present

## 2020-10-19 MED ORDER — NP THYROID 60 MG PO TABS: 60.0000 mg | ORAL_TABLET | Freq: Every day | ORAL | 3 refills | Status: DC

## 2020-10-19 NOTE — Progress Notes (Signed)
Metrics: Intervention Frequency ACO  Documented Smoking Status Yearly  Screened one or more times in 24 months  Cessation Counseling or  Active cessation medication Past 24 months  Past 24 months   Guideline developer: UpToDate (See UpToDate for funding source) Date Released: 2014       Wellness Office Visit  Subjective:  Patient ID: Traci Schmidt, female    DOB: [birth year]  Age: 40 y.o. MRN: [id number]  CC: This lady comes in for follow-up regarding her blood work and further recommendations. HPI  She has suboptimal vitamin D levels.  She has suboptimal free T3 levels.  On closer questioning, she describes cold intolerance, hair loss, constipation, inability to lose weight. She has elevated platelet count without anemia and she has had uterine bleeding which may explain the bone marrow reaction to bleeding.  She did see the gynecologist yesterday and has started on birth control pills to see if this can help the bleeding.  She has an IUD in place. Past Medical History:  Diagnosis Date  . Abnormal uterine bleeding   . Allergy    seasonal  . Fibroid   . Gallstones    Past Surgical History:  Procedure Laterality Date  . BALLOON DILATION N/A 11/12/2013   Procedure: BALLOON DILATION WITH STONE EXTRACTION;  Surgeon: Daneil Dolin, MD;  Location: [facility];  Service: Endoscopy;  Laterality: N/A;  . CESAREAN SECTION    . CESAREAN SECTION N/A    Phreesia 01/13/2020  . CHOLECYSTECTOMY N/A 11/05/2013   Dr. Arnoldo Morale  . ERCP N/A 11/12/2013   Procedure: ENDOSCOPIC RETROGRADE CHOLANGIOPANCREATOGRAPHY (ERCP), SPHINCTEROTOMY, BALLOON DILATION WITH STONE EXTRACTION;  Surgeon: Daneil Dolin, MD;  Location: [facility];  Service: Endoscopy;  Laterality: N/A;  . FRACTURE SURGERY N/A    Phreesia 01/13/2020  . HIP SURGERY Left 07/2020   Done at [facility]  . PELVIC FRACTURE SURGERY  2002  . SPHINCTEROTOMY N/A 11/12/2013   Procedure: SPHINCTEROTOMY;  Surgeon: Daneil Dolin, MD;  Location: [facility];   Service: Endoscopy;  Laterality: N/A;     Family History  Problem Relation Age of Onset  . Cancer Mother        breast  . Diabetes Mother   . Hypertension Mother   . Breast cancer Mother 28  . Hyperlipidemia Father   . Diabetes Father   . Diabetes Brother   . Hyperlipidemia Brother   . Hyperlipidemia Brother   . Colon cancer Neg Hx     Social History   Social History Narrative   Married for 39 years,lives with husband and 3 kids.Stay at home.   Social History   Tobacco Use  . Smoking status: Never Smoker  . Smokeless tobacco: Never Used  Substance Use Topics  . Alcohol use: No    Current Meds  Medication Sig  . acetaminophen (TYLENOL) 650 MG CR tablet Take by mouth.  . levonorgestrel (MIRENA, 52 MG,) 20 MCG/24HR IUD 1 each by Intrauterine route once.  . meloxicam (MOBIC) 15 MG tablet Take 15 mg by mouth daily.  . norethindrone-ethinyl estradiol (LOESTRIN FE) 1-20 MG-MCG tablet Take 1 tablet by mouth daily.  . NP THYROID 60 MG tablet Take 1 tablet (60 mg total) by mouth daily before breakfast.     [place] Office Visit from 09/14/2020 in [facility]  PHQ-9 Total Score 0      Objective:   Today's Vitals: BP 116/74   Pulse 78   Temp (!) 97.5 F (36.4 C) (Temporal)  Ht 5\' 1"  (1.549 m)   Wt 165 lb 9.6 oz (75.1 kg)   SpO2 98%   BMI 31.29 kg/m  Vitals with BMI 10/19/2020 10/18/2020 09/14/2020  Height 5\' 1"  5\' 1"  5' 1.75"  Weight 165 lbs 10 oz 165 lbs 172 lbs 3 oz  BMI 31.31 70.48 88.91  Systolic [phone]  Diastolic 74 78 78  Pulse 78 88 77     Physical Exam   She looks systemically well.  She has lost 7 pounds since the last time I saw her.    Assessment   1. Malaise and fatigue   2. Vitamin D deficiency   3. Cold intolerance   4. Hair loss       Tests ordered No orders of the defined types were placed in this encounter.    Plan: 1. I recommended that she start taking vitamin D3 10,000 units daily. 2. After discussion  and shared decision making, I started her on NP thyroid 60 mg daily, off label, for symptoms of thyroid hypofunction. 3. She will continue with intermittent fasting, try to exercise more and she already eats a plant-based diet. 4. Follow-up in about 6 weeks to see how she is doing.   Meds ordered this encounter  Medications  . NP THYROID 60 MG tablet    Sig: Take 1 tablet (60 mg total) by mouth daily before breakfast.    Dispense:  30 tablet    Refill:  3    Zyair Russi Luther Parody, MD

## 2020-10-20 DIAGNOSIS — M25652 Stiffness of left hip, not elsewhere classified: Secondary | ICD-10-CM | POA: Diagnosis not present

## 2020-10-20 DIAGNOSIS — M25552 Pain in left hip: Secondary | ICD-10-CM | POA: Diagnosis not present

## 2020-10-20 DIAGNOSIS — R531 Weakness: Secondary | ICD-10-CM | POA: Diagnosis not present

## 2020-10-20 DIAGNOSIS — Z4789 Encounter for other orthopedic aftercare: Secondary | ICD-10-CM | POA: Diagnosis not present

## 2020-10-21 DIAGNOSIS — X58XXXS Exposure to other specified factors, sequela: Secondary | ICD-10-CM | POA: Diagnosis not present

## 2020-10-21 DIAGNOSIS — Z9889 Other specified postprocedural states: Secondary | ICD-10-CM | POA: Diagnosis not present

## 2020-10-21 DIAGNOSIS — M7072 Other bursitis of hip, left hip: Secondary | ICD-10-CM | POA: Diagnosis not present

## 2020-10-21 DIAGNOSIS — S73192S Other sprain of left hip, sequela: Secondary | ICD-10-CM | POA: Diagnosis not present

## 2020-10-21 DIAGNOSIS — M25552 Pain in left hip: Secondary | ICD-10-CM | POA: Diagnosis not present

## 2020-10-27 DIAGNOSIS — R531 Weakness: Secondary | ICD-10-CM | POA: Diagnosis not present

## 2020-10-27 DIAGNOSIS — M25552 Pain in left hip: Secondary | ICD-10-CM | POA: Diagnosis not present

## 2020-10-27 DIAGNOSIS — M25652 Stiffness of left hip, not elsewhere classified: Secondary | ICD-10-CM | POA: Diagnosis not present

## 2020-10-27 DIAGNOSIS — Z4789 Encounter for other orthopedic aftercare: Secondary | ICD-10-CM | POA: Diagnosis not present

## 2020-11-02 ENCOUNTER — Telehealth: Payer: Self-pay | Admitting: Surgical

## 2020-11-02 NOTE — Telephone Encounter (Signed)
Patient called to advise that she had surgery in January, but her arm pit Is still swollen and there is a hard area under her arm pit. The breast is okay. Ice does not help the swelling. Please call to advise what else she can do or if she needs to come in to be evaluated. Please call mobile to advise.

## 2020-11-02 NOTE — Telephone Encounter (Signed)
Returned patients call. LMVM to call office back. 

## 2020-11-03 ENCOUNTER — Other Ambulatory Visit: Payer: Self-pay

## 2020-11-03 ENCOUNTER — Encounter (INDEPENDENT_AMBULATORY_CARE_PROVIDER_SITE_OTHER): Payer: Self-pay | Admitting: Internal Medicine

## 2020-11-03 ENCOUNTER — Ambulatory Visit (INDEPENDENT_AMBULATORY_CARE_PROVIDER_SITE_OTHER): Payer: BC Managed Care – PPO | Admitting: Internal Medicine

## 2020-11-03 VITALS — BP 128/71 | HR 110 | Temp 97.3°F | Resp 18 | Ht 61.0 in | Wt 163.4 lb

## 2020-11-03 DIAGNOSIS — R059 Cough, unspecified: Secondary | ICD-10-CM | POA: Diagnosis not present

## 2020-11-03 MED ORDER — AZITHROMYCIN 250 MG PO TABS: ORAL_TABLET | ORAL | 0 refills | Status: DC

## 2020-11-03 MED ORDER — GUAIFENESIN-CODEINE 100-10 MG/5ML PO SYRP: 5.0000 mL | ORAL_SOLUTION | Freq: Three times a day (TID) | ORAL | 0 refills | Status: DC | PRN

## 2020-11-03 NOTE — Progress Notes (Signed)
Metrics: Intervention Frequency ACO  Documented Smoking Status Yearly  Screened one or more times in 24 months  Cessation Counseling or  Active cessation medication Past 24 months  Past 24 months   Guideline developer: UpToDate (See UpToDate for funding source) Date Released: 2014       Wellness Office Visit  Subjective:  Patient ID: Traci Schmidt, female    DOB: 02-Aug-1980  Age: 40 y.o. MRN: [id number]  CC: Cough, chest congestion. HPI  The patient comes in for an acute visit with the above symptoms for the last 4 to 5 days.  She denies fever.  She has taken a COVID-19 test at home which was negative.  She denies any body aches, nasal congestion, loss of taste or smell.  She has no significant dyspnea.  She is a non-smoker. Past Medical History:  Diagnosis Date  . Abnormal uterine bleeding   . Allergy    seasonal  . Fibroid   . Gallstones    Past Surgical History:  Procedure Laterality Date  . BALLOON DILATION N/A 11/12/2013   Procedure: BALLOON DILATION WITH STONE EXTRACTION;  Surgeon: Daneil Dolin, MD;  Location: [facility];  Service: Endoscopy;  Laterality: N/A;  . CESAREAN SECTION    . CESAREAN SECTION N/A    Phreesia 01/13/2020  . CHOLECYSTECTOMY N/A 11/05/2013   Dr. Arnoldo Morale  . ERCP N/A 11/12/2013   Procedure: ENDOSCOPIC RETROGRADE CHOLANGIOPANCREATOGRAPHY (ERCP), SPHINCTEROTOMY, BALLOON DILATION WITH STONE EXTRACTION;  Surgeon: Daneil Dolin, MD;  Location: [facility];  Service: Endoscopy;  Laterality: N/A;  . FRACTURE SURGERY N/A    Phreesia 01/13/2020  . HIP SURGERY Left 07/2020   Done at [facility]  . PELVIC FRACTURE SURGERY  2002  . SPHINCTEROTOMY N/A 11/12/2013   Procedure: SPHINCTEROTOMY;  Surgeon: Daneil Dolin, MD;  Location: [facility];  Service: Endoscopy;  Laterality: N/A;     Family History  Problem Relation Age of Onset  . Cancer Mother        breast  . Diabetes Mother   . Hypertension Mother   . Breast cancer Mother 67  . Hyperlipidemia Father   .  Diabetes Father   . Diabetes Brother   . Hyperlipidemia Brother   . Hyperlipidemia Brother   . Colon cancer Neg Hx     Social History   Social History Narrative   Married for 57 years,lives with husband and 3 kids.Stay at home.   Social History   Tobacco Use  . Smoking status: Never Smoker  . Smokeless tobacco: Never Used  Substance Use Topics  . Alcohol use: No    Current Meds  Medication Sig  . acetaminophen (TYLENOL) 650 MG CR tablet Take by mouth.  Marland Kitchen azithromycin (ZITHROMAX) 250 MG tablet Take 2 tablets the first day and then 1 tablet every day for the next 4 days  . guaiFENesin-codeine (ROBITUSSIN AC) 100-10 MG/5ML syrup Take 5 mLs by mouth 3 (three) times daily as needed for cough.  Marland Kitchen levonorgestrel (MIRENA, 52 MG,) 20 MCG/24HR IUD 1 each by Intrauterine route once.  . meloxicam (MOBIC) 15 MG tablet Take 15 mg by mouth daily.  . norethindrone-ethinyl estradiol (LOESTRIN FE) 1-20 MG-MCG tablet Take 1 tablet by mouth daily.  . NP THYROID 60 MG tablet Take 1 tablet (60 mg total) by mouth daily before breakfast.     [place] Office Visit from 09/14/2020 in [facility]  PHQ-9 Total Score 0      Objective:   Today's Vitals: BP 128/71 (  BP Location: Right Arm, Patient Position: Sitting, Cuff Size: Normal)   Pulse (!) 110   Temp (!) 97.3 F (36.3 C)   Resp 18   Ht 5\' 1"  (1.549 m)   Wt 163 lb 6.4 oz (74.1 kg)   SpO2 99%   BMI 30.87 kg/m  Vitals with BMI 11/03/2020 10/19/2020 10/18/2020  Height 5\' 1"  5\' 1"  5\' 1"   Weight 163 lbs 6 oz 165 lbs 10 oz 165 lbs  BMI 30.89 33.82 50.53  Systolic 976 734 193  Diastolic 71 74 78  Pulse 790 78 88     Physical Exam  She looks systemically well.  She is afebrile.  Saturation on room air is 99%.  Lung fields are clear.  There is no sinus tenderness.     Assessment   1. Cough       Tests ordered No orders of the defined types were placed in this encounter.    Plan: 1. She may have mild bronchitis.   Her husband had similar symptoms which have been helped significantly by Zithromax.  I will treat her with the same as well as Robitussin with codeine for symptomatic relief.  If she does not improve, she will let me know.   Meds ordered this encounter  Medications  . azithromycin (ZITHROMAX) 250 MG tablet    Sig: Take 2 tablets the first day and then 1 tablet every day for the next 4 days    Dispense:  6 tablet    Refill:  0  . guaiFENesin-codeine (ROBITUSSIN AC) 100-10 MG/5ML syrup    Sig: Take 5 mLs by mouth 3 (three) times daily as needed for cough.    Dispense:  120 mL    Refill:  0    Dicy Smigel Luther Parody, MD

## 2020-11-03 NOTE — Progress Notes (Signed)
Still have a little cough, runny nose too said her husband.

## 2020-11-03 NOTE — Telephone Encounter (Signed)
Patient called back yesterday afternoon.  She has had pain and swelling under the axillary area of the left since her BL breast reduction on 06/17/20. She has been hoping within time it would go away. Will send a message to front desk to call patient and schedule an appointment.

## 2020-11-08 ENCOUNTER — Ambulatory Visit (INDEPENDENT_AMBULATORY_CARE_PROVIDER_SITE_OTHER): Payer: BC Managed Care – PPO | Admitting: Surgical

## 2020-11-08 ENCOUNTER — Encounter: Payer: Self-pay | Admitting: Surgical

## 2020-11-08 ENCOUNTER — Other Ambulatory Visit: Payer: Self-pay

## 2020-11-08 DIAGNOSIS — Z9889 Other specified postprocedural states: Secondary | ICD-10-CM | POA: Diagnosis not present

## 2020-11-08 NOTE — Progress Notes (Signed)
   Referring Provider Doree Albee, MD [address] [place],  [place] [address]   CC:  Chief Complaint  Patient presents with  . Follow-up      Traci Schmidt is an 40 y.o. female.  HPI: Patient is a 40 year old female who underwent bilateral breast reduction with Dr. Marla Roe on 06/17/2020.  She is 5 months postop.  She is here today for concerns of ongoing swelling and tenderness within her left axilla  She reports that she feels as if she had had consistent swelling in her left axilla since surgery.  She reports that she has also noticed ongoing tenderness within her left axilla.  It is worse with palpation.  She is otherwise doing well and is very happy with how she has healed after her bilateral breast reduction.  Review of Systems General: No fever or chills Skin: No rashes  Physical Exam Vitals with BMI 11/03/2020 10/19/2020 10/18/2020  Height 5\' 1"  5\' 1"  5\' 1"   Weight 163 lbs 6 oz 165 lbs 10 oz 165 lbs  BMI 30.89 58.30 94.07  Systolic 680 881 103  Diastolic 71 74 78  Pulse 159 78 88    General:  No acute distress,  Alert and oriented, Non-Toxic, Normal speech and affect Breasts: Bilateral NAC's are viable.  Bilateral breast incisions are intact and well-healed.  Mild amount of excess axillary tissue noted.  No fluid collection noted with palpation.  No overlying skin changes noted.  No masses or lumps palpated.  No nipple areola skin changes.  She does have a lot of tenderness within the left axilla with palpation.  Assessment/Plan  Patient is 5 months postop from bilateral breast reduction.  She has had consistent tenderness within the left axilla and felt as if this would improve with time but she has not had any changes.  She is otherwise doing well.  Will send patient for ultrasound to evaluate the area.  We will also schedule a 4-week follow-up/virtual visit to discuss any changes/ultrasound results.  Recommend calling with questions or concerns or if  her symptoms change or worsen.  Picture was taken and placed in the patient's chart with patient's permission  Charlies Constable 11/08/2020, 9:57 AM

## 2020-11-20 DIAGNOSIS — M4326 Fusion of spine, lumbar region: Secondary | ICD-10-CM | POA: Diagnosis not present

## 2020-11-20 DIAGNOSIS — M533 Sacrococcygeal disorders, not elsewhere classified: Secondary | ICD-10-CM | POA: Diagnosis not present

## 2020-11-26 DIAGNOSIS — M533 Sacrococcygeal disorders, not elsewhere classified: Secondary | ICD-10-CM | POA: Diagnosis not present

## 2020-11-26 DIAGNOSIS — M6281 Muscle weakness (generalized): Secondary | ICD-10-CM | POA: Diagnosis not present

## 2020-11-26 DIAGNOSIS — M545 Low back pain, unspecified: Secondary | ICD-10-CM | POA: Diagnosis not present

## 2020-11-30 ENCOUNTER — Other Ambulatory Visit (INDEPENDENT_AMBULATORY_CARE_PROVIDER_SITE_OTHER): Payer: Self-pay | Admitting: Internal Medicine

## 2020-11-30 ENCOUNTER — Ambulatory Visit (INDEPENDENT_AMBULATORY_CARE_PROVIDER_SITE_OTHER): Payer: BC Managed Care – PPO | Admitting: Internal Medicine

## 2020-11-30 ENCOUNTER — Telehealth (INDEPENDENT_AMBULATORY_CARE_PROVIDER_SITE_OTHER): Payer: BC Managed Care – PPO | Admitting: Plastic Surgery

## 2020-11-30 ENCOUNTER — Other Ambulatory Visit: Payer: Self-pay

## 2020-11-30 DIAGNOSIS — N644 Mastodynia: Secondary | ICD-10-CM

## 2020-11-30 NOTE — Progress Notes (Signed)
The patient is a a 40 year old female joining me by phone.  She has some tenderness on the left axillary area lateral breast.  She does want to go ahead with the ultrasound.  I thought we had ordered it but I cannot tell if it has been.  She has not gotten a call about it so Georgina Peer go ahead and put it in again.  I would like to either see her or talk to her after that.

## 2020-12-01 DIAGNOSIS — M545 Low back pain, unspecified: Secondary | ICD-10-CM | POA: Diagnosis not present

## 2020-12-01 DIAGNOSIS — M533 Sacrococcygeal disorders, not elsewhere classified: Secondary | ICD-10-CM | POA: Diagnosis not present

## 2020-12-01 DIAGNOSIS — M6281 Muscle weakness (generalized): Secondary | ICD-10-CM | POA: Diagnosis not present

## 2020-12-02 DIAGNOSIS — L2089 Other atopic dermatitis: Secondary | ICD-10-CM | POA: Diagnosis not present

## 2020-12-03 ENCOUNTER — Ambulatory Visit
Admission: RE | Admit: 2020-12-03 | Discharge: 2020-12-03 | Disposition: A | Discharge status: Home / Self Care | Payer: BC Managed Care – PPO | Source: Ambulatory Visit | Attending: Surgical | Admitting: Surgical

## 2020-12-03 ENCOUNTER — Other Ambulatory Visit: Payer: Self-pay

## 2020-12-03 ENCOUNTER — Other Ambulatory Visit: Payer: Self-pay | Admitting: Surgical

## 2020-12-03 DIAGNOSIS — Z9889 Other specified postprocedural states: Secondary | ICD-10-CM

## 2020-12-03 DIAGNOSIS — N6332 Unspecified lump in axillary tail of the left breast: Secondary | ICD-10-CM | POA: Diagnosis not present

## 2020-12-03 DIAGNOSIS — M533 Sacrococcygeal disorders, not elsewhere classified: Secondary | ICD-10-CM | POA: Diagnosis not present

## 2020-12-04 DIAGNOSIS — M7072 Other bursitis of hip, left hip: Secondary | ICD-10-CM | POA: Insufficient documentation

## 2020-12-14 DIAGNOSIS — M545 Low back pain, unspecified: Secondary | ICD-10-CM | POA: Diagnosis not present

## 2020-12-14 DIAGNOSIS — M6281 Muscle weakness (generalized): Secondary | ICD-10-CM | POA: Diagnosis not present

## 2020-12-14 DIAGNOSIS — M533 Sacrococcygeal disorders, not elsewhere classified: Secondary | ICD-10-CM | POA: Diagnosis not present

## 2020-12-15 NOTE — Progress Notes (Deleted)
40 y.o. G66P3003 Married Asian Not Hispanic or Latino female here for annual exam.      No LMP recorded. (Menstrual status: IUD).          Sexually active: {yes no:314532}  The current method of family planning is {contraception:315051}.    Exercising: {yes no:314532}  {types:19826} Smoker:  {YES NO:22349}  Health Maintenance: Pap:  01/31/2018 WNL Hr HPV Neg  History of abnormal Pap:  {YES NO:22349} MMG:  02/11/20  BMD:   *** Colonoscopy: *** TDaP:  *** Gardasil: ***   reports that she has never smoked. She has never used smokeless tobacco. She reports that she does not drink alcohol and does not use drugs.  Past Medical History:  Diagnosis Date   Abnormal uterine bleeding    Allergy    seasonal   Fibroid    Gallstones     Past Surgical History:  Procedure Laterality Date   BALLOON DILATION N/A 11/12/2013   Procedure: BALLOON DILATION WITH STONE EXTRACTION;  Surgeon: Daneil Dolin, MD;  Location: [facility];  Service: Endoscopy;  Laterality: N/A;   CESAREAN SECTION     CESAREAN SECTION N/A    Phreesia 01/13/2020   CHOLECYSTECTOMY N/A 11/05/2013   Dr. Arnoldo Morale   ERCP N/A 11/12/2013   Procedure: ENDOSCOPIC RETROGRADE CHOLANGIOPANCREATOGRAPHY (ERCP), SPHINCTEROTOMY, BALLOON DILATION WITH STONE EXTRACTION;  Surgeon: Daneil Dolin, MD;  Location: [facility];  Service: Endoscopy;  Laterality: N/A;   FRACTURE SURGERY N/A    Phreesia 01/13/2020   HIP SURGERY Left 07/2020   Done at [place]   SPHINCTEROTOMY N/A 11/12/2013   Procedure: Joan Mayans;  Surgeon: Daneil Dolin, MD;  Location: [facility];  Service: Endoscopy;  Laterality: N/A;    Current Outpatient Medications  Medication Sig Dispense Refill   levonorgestrel (MIRENA, 52 MG,) 20 MCG/24HR IUD 1 each by Intrauterine route once.     meloxicam (MOBIC) 15 MG tablet Take 15 mg by mouth daily.     NP THYROID 60 MG tablet Take 1 tablet (60 mg total) by mouth daily before breakfast. 30 tablet 3   No current  facility-administered medications for this visit.    Family History  Problem Relation Age of Onset   Cancer Mother        breast   Diabetes Mother    Hypertension Mother    Breast cancer Mother 27   Hyperlipidemia Father    Diabetes Father    Diabetes Brother    Hyperlipidemia Brother    Hyperlipidemia Brother    Colon cancer Neg Hx     Review of Systems  Exam:   There were no vitals taken for this visit.  Weight change: @WEIGHTCHANGE @ Height:      Ht Readings from Last 3 Encounters:  11/03/20 5\' 1"  (1.549 m)  10/19/20 5\' 1"  (1.549 m)  10/18/20 5\' 1"  (1.549 m)    General appearance: alert, cooperative and appears stated age Head: Normocephalic, without obvious abnormality, atraumatic Neck: no adenopathy, supple, symmetrical, trachea midline and thyroid {CHL AMB PHY EX THYROID NORM DEFAULT:[phone]} Lungs: clear to auscultation bilaterally Cardiovascular: regular rate and rhythm Breasts: {Exam; breast:13139} Abdomen: soft, non-tender; non distended,  no masses,  no organomegaly Extremities: extremities normal, atraumatic, no cyanosis or edema Skin: Skin color, texture, turgor normal. No rashes or lesions Lymph nodes: Cervical, supraclavicular, and axillary nodes normal. No abnormal inguinal nodes palpated Neurologic: Grossly normal   Pelvic: External genitalia:  no lesions  Urethra:  normal appearing urethra with no masses, tenderness or lesions              Bartholins and Skenes: normal                 Vagina: normal appearing vagina with normal color and discharge, no lesions              Cervix: {CHL AMB PHY EX CERVIX NORM [phone]}               Bimanual Exam:  Uterus:  {CHL AMB PHY EX UTERUS NORM DEFAULT:[phone]}              Adnexa: {CHL AMB PHY EX ADNEXA NO MASS DEFAULT:[phone]}               Rectovaginal: Confirms               Anus:  normal sphincter tone, no lesions  *** chaperoned for the exam.  A:  Well Woman with normal  exam  P:

## 2020-12-16 ENCOUNTER — Ambulatory Visit: Payer: BC Managed Care – PPO | Admitting: Obstetrics and Gynecology

## 2020-12-16 DIAGNOSIS — Z0289 Encounter for other administrative examinations: Secondary | ICD-10-CM

## 2020-12-17 DIAGNOSIS — M7072 Other bursitis of hip, left hip: Secondary | ICD-10-CM | POA: Diagnosis not present

## 2020-12-27 NOTE — Progress Notes (Signed)
40 y.o. G3P3003 Married Asian Not Hispanic or Latino female here for annual exam.    The patient has a mirena IUD, placed in 4/20. Prior to 1/22 she was spotting for a couple of days every 2-3 months. She started having more bleeding/spotting in 1/22, in early May when she was seen she had been spotting for an entire month. She was stared on loestrin. She only took the pills for one week secondary to nausea, but her bleeding stopped. Since then she had a "normal" period x 5-7 days, light flow, no cramps.  Sexually active, no pain.    She has had a complicated history with multiple surgeries to try and repair a fractured pelvis.  The pelvic bone surgery is done (still missing a piece of pelvic bone in the front, but stable). Her left hip is giving her some problems, had arthroscopic surgery which didn't help. She has a steroid injection initially helped, 2nd time didn't help.   H/O grade 2 cystocele and rectocele and grade 1 uterine prolapse. Only notices the bulge if she checks with her hand. Also h/o C/S x 3.   She had a breast reduction in 1/22. 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 (Menstrual status: IUD).          Sexually active: Yes.    The current method of family planning is IUD.    Exercising: Yes.     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  BMD:   none Colonoscopy: none TDaP:  02/17/20 Gardasil: none, declines.    reports that she has never smoked. She has never used smokeless tobacco. She reports that she does not drink alcohol and does not use drugs. Homemaker. Kids are 13, 11 and 4. Older 2 are girls, youngest is a boy.   Past Medical History:  Diagnosis Date   Abnormal uterine bleeding    Allergy    seasonal   Fibroid    Gallstones     Past Surgical History:  Procedure Laterality  Date   BALLOON DILATION N/A 11/12/2013   Procedure: BALLOON DILATION WITH STONE EXTRACTION;  Surgeon: Daneil Dolin, MD;  Location: [facility];  Service: Endoscopy;  Laterality: N/A;   CESAREAN SECTION     CESAREAN SECTION N/A    Phreesia 01/13/2020   CHOLECYSTECTOMY N/A 11/05/2013   Dr. Arnoldo Morale   ERCP N/A 11/12/2013   Procedure: ENDOSCOPIC RETROGRADE CHOLANGIOPANCREATOGRAPHY (ERCP), SPHINCTEROTOMY, BALLOON DILATION WITH STONE EXTRACTION;  Surgeon: Daneil Dolin, MD;  Location: [facility];  Service: Endoscopy;  Laterality: N/A;   FRACTURE SURGERY N/A    Phreesia 01/13/2020   HIP SURGERY Left 07/2020   Done at [place]   SPHINCTEROTOMY N/A 11/12/2013   Procedure: Joan Mayans;  Surgeon: Daneil Dolin, MD;  Location: [facility];  Service: Endoscopy;  Laterality: N/A;    Current Outpatient Medications  Medication Sig Dispense Refill   levonorgestrel (MIRENA, 52 MG,) 20 MCG/24HR IUD 1 each by Intrauterine route once.     meloxicam (MOBIC) 15 MG tablet Take 15 mg by mouth daily.     NP THYROID 60 MG tablet Take 1 tablet (60 mg total) by mouth daily before breakfast. 30 tablet 3   No current facility-administered medications for this visit.  Family History  Problem Relation Age of Onset   Cancer Mother        breast   Diabetes Mother    Hypertension Mother    Breast cancer Mother 97   Hyperlipidemia Father    Diabetes Father    Diabetes Brother    Hyperlipidemia Brother    Hyperlipidemia Brother    Colon cancer Neg Hx     Review of Systems  Exam:   BP 120/70   Pulse 81   Ht 5\' 1"  (1.549 m)   Wt 154 lb (69.9 kg)   SpO2 100%   BMI 29.10 kg/m   Weight change: @WEIGHTCHANGE @ Height:   Height: 5\' 1"  (154.9 cm)  Ht Readings from Last 3 Encounters:  12/28/20 5\' 1"  (1.549 m)  11/03/20 5\' 1"  (1.549 m)  10/19/20 5\' 1"  (1.549 m)    General appearance: alert, cooperative and appears stated age Head: Normocephalic, without obvious abnormality,  atraumatic Neck: no adenopathy, supple, symmetrical, trachea midline and thyroid normal to inspection and palpation Lungs: clear to auscultation bilaterally Cardiovascular: regular rate and rhythm Breasts: normal appearance, no masses or tenderness Abdomen: soft, non-tender; non distended,  no masses,  no organomegaly Extremities: extremities normal, atraumatic, no cyanosis or edema Skin: Skin color, texture, turgor normal. No rashes or lesions Lymph nodes: Cervical, supraclavicular, and axillary nodes normal. Tender in the left axilla, no mass (recent negative ultrasound exam in this region) No abnormal inguinal nodes palpated Neurologic: Grossly normal   Pelvic: External genitalia:  no lesions              Urethra:  normal appearing urethra with no masses, tenderness or lesions              Bartholins and Skenes: normal                 Vagina: normal appearing vagina with normal color and discharge, no lesions. Grade 2 cystocele and rectocele, grade 1-2 uterine prolapse.               Cervix: no lesions and IUD string 1 cm               Bimanual Exam:  Uterus:  normal size, contour, position, consistency, mobility, non-tender and retroverted              Adnexa: no mass, fullness, tenderness               Rectovaginal: Confirms               Anus:  normal sphincter tone, no lesions  Gae Dry chaperoned for the exam.  1. Well woman exam Discussed breast self exam Discussed calcium and vit D intake Labs with primary Mammogram in 8/22  2. IUD check up Doing well, BTB has stopped  3. Female genital prolapse, unspecified type Not symptomatic  4. Musculoskeletal pain In her left lower abdominal wall. Information given. Declines physical therapy

## 2020-12-28 ENCOUNTER — Encounter: Payer: Self-pay | Admitting: Obstetrics and Gynecology

## 2020-12-28 ENCOUNTER — Other Ambulatory Visit: Payer: Self-pay

## 2020-12-28 ENCOUNTER — Ambulatory Visit (INDEPENDENT_AMBULATORY_CARE_PROVIDER_SITE_OTHER): Payer: BC Managed Care – PPO | Admitting: Obstetrics and Gynecology

## 2020-12-28 VITALS — BP 120/70 | HR 81 | Ht 61.0 in | Wt 154.0 lb

## 2020-12-28 DIAGNOSIS — Z01419 Encounter for gynecological examination (general) (routine) without abnormal findings: Secondary | ICD-10-CM

## 2020-12-28 DIAGNOSIS — N819 Female genital prolapse, unspecified: Secondary | ICD-10-CM

## 2020-12-28 DIAGNOSIS — Z30431 Encounter for routine checking of intrauterine contraceptive device: Secondary | ICD-10-CM | POA: Diagnosis not present

## 2020-12-28 DIAGNOSIS — M7918 Myalgia, other site: Secondary | ICD-10-CM

## 2020-12-28 NOTE — Patient Instructions (Signed)
EXERCISE   We recommended that you start or continue a regular exercise program for good health. Physical activity is anything that gets your body moving, some is better than none. The CDC recommends 150 minutes per week of Moderate-Intensity Aerobic Activity and 2 or more days of Muscle Strengthening Activity.  Benefits of exercise are limitless: helps weight loss/weight maintenance, improves mood and energy, helps with depression and anxiety, improves sleep, tones and strengthens muscles, improves balance, improves bone density, protects from chronic conditions such as heart disease, high blood pressure and diabetes and so much more. To learn more visit: https://www.cdc.gov/physicalactivity/index.html  DIET: Good nutrition starts with a healthy diet of fruits, vegetables, whole grains, and lean protein sources. Drink plenty of water for hydration. Minimize empty calories, sodium, sweets. For more information about dietary recommendations visit: https://health.gov/our-work/nutrition-physical-activity/dietary-guidelines and https://www.myplate.gov/  ALCOHOL:  Women should limit their alcohol intake to no more than 7 drinks/beers/glasses of wine (combined, not each!) per week. Moderation of alcohol intake to this level decreases your risk of breast cancer and liver damage.  If you are concerned that you may have a problem, or your friends have told you they are concerned about your drinking, there are many resources to help. A well-known program that is free, effective, and available to all people all over the nation is Alcoholics Anonymous.  Check out this site to learn more: https://www.aa.org/   CALCIUM AND VITAMIN D:  Adequate intake of calcium and Vitamin D are recommended for bone health.  You should be getting between 1000-1200 mg of calcium and 800 units of Vitamin D daily between diet and supplements  PAP SMEARS:  Pap smears, to check for cervical cancer or precancers,  have traditionally been  done yearly, scientific advances have shown that most women can have pap smears less often.  However, every woman still should have a physical exam from her gynecologist every year. It will include a breast check, inspection of the vulva and vagina to check for abnormal growths or skin changes, a visual exam of the cervix, and then an exam to evaluate the size and shape of the uterus and ovaries. We will also provide age appropriate advice regarding health maintenance, like when you should have certain vaccines, screening for sexually transmitted diseases, bone density testing, colonoscopy, mammograms, etc.   MAMMOGRAMS:  All women over 40 years old should have a routine mammogram.   COLON CANCER SCREENING: Now recommend starting at age 45. At this time colonoscopy is not covered for routine screening until 50. There are take home tests that can be done between 45-49.   COLONOSCOPY:  Colonoscopy to screen for colon cancer is recommended for all women at age 50.  We know, you hate the idea of the prep.  We agree, BUT, having colon cancer and not knowing it is worse!!  Colon cancer so often starts as a polyp that can be seen and removed at colonscopy, which can quite literally save your life!  And if your first colonoscopy is normal and you have no family history of colon cancer, most women don't have to have it again for 10 years.  Once every ten years, you can do something that may end up saving your life, right?  We will be happy to help you get it scheduled when you are ready.  Be sure to check your insurance coverage so you understand how much it will cost.  It may be covered as a preventative service at no cost, but you should check   your particular policy.      Breast Self-Awareness Breast self-awareness means being familiar with how your breasts look and feel. It involves checking your breasts regularly and reporting any changes to your health care provider. Practicing breast self-awareness is  important. A change in your breasts can be a sign of a serious medical problem. Being familiar with how your breasts look and feel allows you to find any problems early, when treatment is more likely to be successful. All women should practice breast self-awareness, including women who have had breast implants. How to do a breast self-exam One way to learn what is normal for your breasts and whether your breasts are changing is to do a breast self-exam. To do a breast self-exam: Look for Changes  Remove all the clothing above your waist. Stand in front of a mirror in a room with good lighting. Put your hands on your hips. Push your hands firmly downward. Compare your breasts in the mirror. Look for differences between them (asymmetry), such as: Differences in shape. Differences in size. Puckers, dips, and bumps in one breast and not the other. Look at each breast for changes in your skin, such as: Redness. Scaly areas. Look for changes in your nipples, such as: Discharge. Bleeding. Dimpling. Redness. A change in position. Feel for Changes Carefully feel your breasts for lumps and changes. It is best to do this while lying on your back on the floor and again while sitting or standing in the shower or tub with soapy water on your skin. Feel each breast in the following way: Place the arm on the side of the breast you are examining above your head. Feel your breast with the other hand. Start in the nipple area and make  inch (2 cm) overlapping circles to feel your breast. Use the pads of your three middle fingers to do this. Apply light pressure, then medium pressure, then firm pressure. The light pressure will allow you to feel the tissue closest to the skin. The medium pressure will allow you to feel the tissue that is a little deeper. The firm pressure will allow you to feel the tissue close to the ribs. Continue the overlapping circles, moving downward over the breast until you feel your  ribs below your breast. Move one finger-width toward the center of the body. Continue to use the  inch (2 cm) overlapping circles to feel your breast as you move slowly up toward your collarbone. Continue the up and down exam using all three pressures until you reach your armpit.  Write Down What You Find  Write down what is normal for each breast and any changes that you find. Keep a written record with breast changes or normal findings for each breast. By writing this information down, you do not need to depend only on memory for size, tenderness, or location. Write down where you are in your menstrual cycle, if you are still menstruating. If you are having trouble noticing differences in your breasts, do not get discouraged. With time you will become more familiar with the variations in your breasts and more comfortable with the exam. How often should I examine my breasts? Examine your breasts every month. If you are breastfeeding, the best time to examine your breasts is after a feeding or after using a breast pump. If you menstruate, the best time to examine your breasts is 5-7 days after your period is over. During your period, your breasts are lumpier, and it may be more   difficult to notice changes. When should I see my health care provider? See your health care provider if you notice: A change in shape or size of your breasts or nipples. A change in the skin of your breast or nipples, such as a reddened or scaly area. Unusual discharge from your nipples. A lump or thick area that was not there before. Pain in your breasts. Anything that concerns you. Musculoskeletal Pain Musculoskeletal pain refers to aches and pains in your bones, joints, muscles, and the tissues that surround them. This pain can occur in any part of the body. It can last for a short time (acute) or a long time (chronic). A physical exam, lab tests, and imaging studies may be done to find the causeof your  musculoskeletal pain. Follow these instructions at home: Lifestyle Try to control or lower your stress levels. Stress increases muscle tension and can worsen musculoskeletal pain. It is important to recognize when you are anxious or stressed and learn ways to manage it. This may include: Meditation or yoga. Cognitive or behavioral therapy. Acupuncture or massage therapy. You may continue all activities unless the activities cause more pain. When the pain gets better, slowly resume your normal activities. Gradually increase the intensity and duration of your activities or exercise. Managing pain, stiffness, and swelling     Treatment may include medicines for pain and inflammation that are taken by mouth or applied to the skin. Take over-the-counter and prescription medicines only as told by your health care provider. When your pain is severe, bed rest may be helpful. Lie or sit in any position that is comfortable, but get out of bed and walk around at least every couple of hours. If directed, apply heat to the affected area as often as told by your health care provider. Use the heat source that your health care provider recommends, such as a moist heat pack or a heating pad. Place a towel between your skin and the heat source. Leave the heat on for 20-30 minutes. Remove the heat if your skin turns bright red. This is especially important if you are unable to feel pain, heat, or cold. You may have a greater risk of getting burned. If directed, put ice on the painful area. To do this: Put ice in a plastic bag. Place a towel between your skin and the bag. Leave the ice on for 20 minutes, 2-3 times a day. Remove the ice if your skin turns bright red. This is very important. If you cannot feel pain, heat, or cold, you have a greater risk of damage to the area. General instructions Your health care provider may recommend that you see a physical therapist. This person can help you come up with a  safe exercise program. If told by your health care provider, do physical therapy exercises to improve movement and strength in the affected area. Keep all follow-up visits. This is important. This includes any physical therapy visits. Contact a health care provider if: Your pain gets worse. Medicines do not help ease your pain. You cannot use the part of your body that hurts, such as your arm, leg, or neck. You have trouble sleeping. You have trouble doing your normal activities. Get help right away if: You have a new injury and your pain is worse or different. You feel numb or you have tingling in the painful area. Summary Musculoskeletal pain refers to aches and pains in your bones, joints, muscles, and the tissues that surround them. This pain  can occur in any part of the body. Your health care provider may recommend that you see a physical therapist. This person can help you come up with a safe exercise program. Do any exercises as told by your physical therapist. Lower your stress level. Stress can worsen musculoskeletal pain. Ways to lower stress may include meditation, yoga, cognitive or behavioral therapy, acupuncture, and massage therapy. This information is not intended to replace advice given to you by your health care provider. Make sure you discuss any questions you have with your healthcare provider. Document Revised: 10/09/2019 Document Reviewed: 09/17/2019 Elsevier Patient Education  2022 [employer].

## 2020-12-30 ENCOUNTER — Encounter (INDEPENDENT_AMBULATORY_CARE_PROVIDER_SITE_OTHER): Payer: Self-pay | Admitting: Internal Medicine

## 2020-12-30 ENCOUNTER — Ambulatory Visit (INDEPENDENT_AMBULATORY_CARE_PROVIDER_SITE_OTHER): Payer: BC Managed Care – PPO | Admitting: Internal Medicine

## 2020-12-30 ENCOUNTER — Other Ambulatory Visit: Payer: Self-pay

## 2020-12-30 VITALS — BP 118/80 | HR 88 | Temp 98.1°F | Resp 18 | Ht 61.0 in | Wt 153.0 lb

## 2020-12-30 DIAGNOSIS — R748 Abnormal levels of other serum enzymes: Secondary | ICD-10-CM

## 2020-12-30 DIAGNOSIS — R5381 Other malaise: Secondary | ICD-10-CM | POA: Diagnosis not present

## 2020-12-30 DIAGNOSIS — R5383 Other fatigue: Secondary | ICD-10-CM

## 2020-12-30 DIAGNOSIS — I83812 Varicose veins of left lower extremities with pain: Secondary | ICD-10-CM

## 2020-12-30 DIAGNOSIS — R6889 Other general symptoms and signs: Secondary | ICD-10-CM | POA: Diagnosis not present

## 2020-12-30 DIAGNOSIS — L659 Nonscarring hair loss, unspecified: Secondary | ICD-10-CM | POA: Diagnosis not present

## 2020-12-30 NOTE — Progress Notes (Signed)
Metrics: Intervention Frequency ACO  Documented Smoking Status Yearly  Screened one or more times in 24 months  Cessation Counseling or  Active cessation medication Past 24 months  Past 24 months   Guideline developer: UpToDate (See UpToDate for funding source) Date Released: 2014       Wellness Office Visit  Subjective:  Patient ID: Traci Schmidt, female    DOB: August 27, 1980  Age: 40 y.o. MRN: [id number]  CC: This lady comes in for follow-up of thyroid therapy and abnormal liver enzymes. HPI  She is tolerating NP thyroid 60 mg daily and does feel better with less cold intolerance now.  She did have elevated liver enzymes and I am not sure why but we need to repeat them now. She is concerned about unsightly and painful varicose veins on the left leg and wishes to know what to do about it. Past Medical History:  Diagnosis Date   Abnormal uterine bleeding    Allergy    seasonal   Fibroid    Gallstones    Past Surgical History:  Procedure Laterality Date   BALLOON DILATION N/A 11/12/2013   Procedure: BALLOON DILATION WITH STONE EXTRACTION;  Surgeon: Daneil Dolin, MD;  Location: [facility];  Service: Endoscopy;  Laterality: N/A;   CESAREAN SECTION     CESAREAN SECTION N/A    Phreesia 01/13/2020   CHOLECYSTECTOMY N/A 11/05/2013   Dr. Arnoldo Morale   ERCP N/A 11/12/2013   Procedure: ENDOSCOPIC RETROGRADE CHOLANGIOPANCREATOGRAPHY (ERCP), SPHINCTEROTOMY, BALLOON DILATION WITH STONE EXTRACTION;  Surgeon: Daneil Dolin, MD;  Location: [facility];  Service: Endoscopy;  Laterality: N/A;   FRACTURE SURGERY N/A    Phreesia 01/13/2020   HIP SURGERY Left 07/2020   Done at [facility]   SPHINCTEROTOMY N/A 11/12/2013   Procedure: Joan Mayans;  Surgeon: Daneil Dolin, MD;  Location: [facility];  Service: Endoscopy;  Laterality: N/A;     Family History  Problem Relation Age of Onset   Cancer Mother        breast   Diabetes Mother    Hypertension Mother    Breast  cancer Mother 65   Hyperlipidemia Father    Diabetes Father    Diabetes Brother    Hyperlipidemia Brother    Hyperlipidemia Brother    Colon cancer Neg Hx     Social History   Social History Narrative   Married for 30 years,lives with husband and 3 kids.Stay at home.   Social History   Tobacco Use   Smoking status: Never   Smokeless tobacco: Never  Substance Use Topics   Alcohol use: No    Current Meds  Medication Sig   levonorgestrel (MIRENA, 52 MG,) 20 MCG/24HR IUD 1 each by Intrauterine route once.   meloxicam (MOBIC) 15 MG tablet Take 15 mg by mouth daily.   NP THYROID 60 MG tablet Take 1 tablet (60 mg total) by mouth daily before breakfast.     [facility] Office Visit from 09/14/2020 in [facility]  PHQ-9 Total Score 0       Objective:   Today's Vitals: BP 118/80 (BP Location: Right Arm, Patient Position: Sitting, Cuff Size: Small)   Pulse 88   Temp 98.1 F (36.7 C) (Temporal)   Resp 18   Ht 5\' 1"  (1.549 m)   Wt 153 lb (69.4 kg)   SpO2 93%   BMI 28.91 kg/m  Vitals with BMI 12/30/2020 12/28/2020 11/03/2020  Height 5\' 1"  5\' 1"   5\' 1"   Weight 153 lbs 154 lbs 163 lbs 6 oz  BMI 28.92 26.71 24.58  Systolic 099 833 825  Diastolic 80 70 71  Pulse 88 81 110     Physical Exam   She looks systemically well.  She has maintained weight loss.  Blood pressure is excellent. Examination of the left leg does show varicose veins when she stands up and these appear to be painful for her.    Assessment   1. Cold intolerance   2. Hair loss   3. Malaise and fatigue   4. Abnormal liver enzymes   5. Varicose veins of left lower extremity with pain       Tests ordered Orders Placed This Encounter  Procedures   T3, free   TSH   COMPLETE METABOLIC PANEL WITH GFR   Ambulatory referral to Vascular Surgery      Plan: 1.  Continue with NP thyroid 60 mg daily and we will check thyroid function today.  I have told her we may well need to escalate  the dose. 2.  Check liver enzymes today also to follow. 3.  I will refer to vascular surgery regarding the varicose veins. 4.  Follow-up in about 6 weeks.   No orders of the defined types were placed in this encounter.   Doree Albee, MD

## 2020-12-31 LAB — COMPLETE METABOLIC PANEL WITH GFR
AG Ratio: 1.2 (calc) (ref 1.0–2.5)
ALT: 20 U/L (ref 6–29)
AST: 18 U/L (ref 10–30)
Albumin: 4.1 g/dL (ref 3.6–5.1)
Alkaline phosphatase (APISO): 60 U/L (ref 31–125)
BUN: 12 mg/dL (ref 7–25)
CO2: 30 mmol/L (ref 20–32)
Calcium: 9.7 mg/dL (ref 8.6–10.2)
Chloride: 103 mmol/L (ref 98–110)
Creat: 0.73 mg/dL (ref 0.50–0.97)
Globulin: 3.4 g/dL (calc) (ref 1.9–3.7)
Glucose, Bld: 93 mg/dL (ref 65–139)
Potassium: 4.8 mmol/L (ref 3.5–5.3)
Sodium: 139 mmol/L (ref 135–146)
Total Bilirubin: 0.6 mg/dL (ref 0.2–1.2)
Total Protein: 7.5 g/dL (ref 6.1–8.1)
eGFR: 107 mL/min/{1.73_m2} (ref >=60)

## 2020-12-31 LAB — T3, FREE: T3, Free: 3.8 pg/mL (ref 2.3–4.2)

## 2020-12-31 LAB — TSH: TSH: 0.22 mIU/L — ABNORMAL LOW

## 2021-01-12 DIAGNOSIS — M25552 Pain in left hip: Secondary | ICD-10-CM | POA: Diagnosis not present

## 2021-01-12 DIAGNOSIS — M4326 Fusion of spine, lumbar region: Secondary | ICD-10-CM | POA: Diagnosis not present

## 2021-01-12 DIAGNOSIS — Z981 Arthrodesis status: Secondary | ICD-10-CM | POA: Diagnosis not present

## 2021-01-18 ENCOUNTER — Other Ambulatory Visit: Payer: Self-pay

## 2021-01-18 DIAGNOSIS — I8393 Asymptomatic varicose veins of bilateral lower extremities: Secondary | ICD-10-CM

## 2021-02-04 ENCOUNTER — Encounter: Payer: Self-pay | Admitting: Vascular Surgery

## 2021-02-04 ENCOUNTER — Ambulatory Visit (INDEPENDENT_AMBULATORY_CARE_PROVIDER_SITE_OTHER): Payer: BC Managed Care – PPO | Admitting: Vascular Surgery

## 2021-02-04 ENCOUNTER — Other Ambulatory Visit: Payer: Self-pay

## 2021-02-04 ENCOUNTER — Ambulatory Visit (HOSPITAL_COMMUNITY)
Admission: RE | Admit: 2021-02-04 | Discharge: 2021-02-04 | Disposition: A | Discharge status: Home / Self Care | Payer: BC Managed Care – PPO | Source: Ambulatory Visit | Attending: Vascular Surgery | Admitting: Vascular Surgery

## 2021-02-04 VITALS — BP 128/85 | HR 79 | Temp 97.5°F | Resp 16 | Ht 61.0 in | Wt 151.7 lb

## 2021-02-04 DIAGNOSIS — I83892 Varicose veins of left lower extremities with other complications: Secondary | ICD-10-CM | POA: Diagnosis not present

## 2021-02-04 DIAGNOSIS — I8393 Asymptomatic varicose veins of bilateral lower extremities: Secondary | ICD-10-CM | POA: Diagnosis not present

## 2021-02-04 NOTE — Progress Notes (Signed)
Patient ID: Maximino Sarin, female   DOB: [birth year], 40 y.o.   MRN: [id number]  Reason for Consult: New Patient (Initial Visit)   Referred by Doree Albee, MD  Subjective:     HPI:  Rabecca Klepinger is a 40 y.o. female without significant vascular disease.  She presents with left lower extremity superficial varicosities.  She has had 1 instance of pain in one of the varicosities that required pressure.  She does get symptomatic relief from wearing thigh-high compression stockings which she has been wearing for 2 months.  She denies any history of blood clot.  She did have bruising around one of the varicosities at the time of pain.  She has aching at 2 areas in her thigh and 1 area in her lateral calf which cause her to place pressure on her leg in the middle of the night.  She does not have any right lower extremity symptoms.  She has had arthroscopy of her left hip which exacerbated the varicosities as well as her most recent child birth which was 4 years ago.  She does not take any blood thinners.  Past Medical History:  Diagnosis Date   Abnormal uterine bleeding    Allergy    seasonal   Fibroid    Gallstones    Thyroid disease    Family History  Problem Relation Age of Onset   Cancer Mother        breast   Diabetes Mother    Hypertension Mother    Breast cancer Mother 48   Hyperlipidemia Father    Diabetes Father    Diabetes Brother    Hyperlipidemia Brother    Hyperlipidemia Brother    Colon cancer Neg Hx    Past Surgical History:  Procedure Laterality Date   BALLOON DILATION N/A 11/12/2013   Procedure: BALLOON DILATION WITH STONE EXTRACTION;  Surgeon: Daneil Dolin, MD;  Location: [facility];  Service: Endoscopy;  Laterality: N/A;   CESAREAN SECTION     CESAREAN SECTION N/A    Phreesia 01/13/2020   CHOLECYSTECTOMY N/A 11/05/2013   Dr. Arnoldo Morale   ERCP N/A 11/12/2013   Procedure: ENDOSCOPIC RETROGRADE CHOLANGIOPANCREATOGRAPHY (ERCP),  SPHINCTEROTOMY, BALLOON DILATION WITH STONE EXTRACTION;  Surgeon: Daneil Dolin, MD;  Location: [facility];  Service: Endoscopy;  Laterality: N/A;   FRACTURE SURGERY N/A    Phreesia 01/13/2020   HIP SURGERY Left 07/2020   Done at [place]   SPHINCTEROTOMY N/A 11/12/2013   Procedure: Joan Mayans;  Surgeon: Daneil Dolin, MD;  Location: [facility];  Service: Endoscopy;  Laterality: N/A;    Short Social History:  Social History   Tobacco Use   Smoking status: Never   Smokeless tobacco: Never  Substance Use Topics   Alcohol use: No    Allergies  Allergen Reactions   Other     Seasonal allergies    Current Outpatient Medications  Medication Sig Dispense Refill   acetaminophen (TYLENOL) 500 MG tablet Take 500 mg by mouth every 6 (six) hours as needed.     levonorgestrel (MIRENA, 52 MG,) 20 MCG/24HR IUD 1 each by Intrauterine route once.     meloxicam (MOBIC) 15 MG tablet Take 15 mg by mouth daily.     NP THYROID 60 MG tablet Take 1 tablet (60 mg total) by mouth daily before breakfast. 30 tablet 3   No current facility-administered medications for this visit.    Review of Systems  Constitutional:  Constitutional negative. HENT: HENT negative.  Eyes: Eyes negative.  Cardiovascular: Cardiovascular negative.  GI: Gastrointestinal negative.  Musculoskeletal: Positive for leg pain.       Left lower extremity varicosities Neurological: Neurological negative. Hematologic: Hematologic/lymphatic negative.  Psychiatric: Psychiatric negative.       Objective:  Objective   Vitals:   02/04/21 1451  BP: 128/85  Pulse: 79  Resp: 16  Temp: (!) 97.5 F (36.4 C)  SpO2: 99%  Weight: 151 lb 11.2 oz (68.8 kg)  Height: '5\' 1"'$  (1.549 m)   Body mass index is 28.66 kg/m.  Physical Exam HENT:     Head: Normocephalic.     Nose:     Comments: Wearing a mask Cardiovascular:     Rate and Rhythm: Normal rate.     Pulses: Normal pulses.  Pulmonary:      Effort: Pulmonary effort is normal.  Abdominal:     General: Abdomen is flat.  Musculoskeletal:        General: Normal range of motion.  Skin:    Comments: She has multiple varicosities laterally in her thigh and in her popliteal fossa and laterally on her leg, nothing medially  Neurological:     Mental Status: She is alert.    Data: +---------------+---------+------+-----------+------------+--------+  LEFT           Reflux NoRefluxReflux TimeDiameter cmsComments                           Yes                                   +---------------+---------+------+-----------+------------+--------+  CFV            no                                              +---------------+---------+------+-----------+------------+--------+  FV mid         no                                              +---------------+---------+------+-----------+------------+--------+  Popliteal      no                                              +---------------+---------+------+-----------+------------+--------+  GSV at [facility]     no                           0.684              +---------------+---------+------+-----------+------------+--------+  GSV prox thigh no                           0.465              +---------------+---------+------+-----------+------------+--------+  GSV mid thigh  no                           0.342              +---------------+---------+------+-----------+------------+--------+  GSV dist thigh no                           0.369              +---------------+---------+------+-----------+------------+--------+  GSV at knee    no                           0.355              +---------------+---------+------+-----------+------------+--------+  GSV prox calf  no                           0.274              +---------------+---------+------+-----------+------------+--------+  SSV Pop Fossa  no                            0.233              +---------------+---------+------+-----------+------------+--------+  SSV prox calf  no                            0.18              +---------------+---------+------+-----------+------------+--------+  SSV mid calf   no                           0.223              +---------------+---------+------+-----------+------------+--------+  Thigh extension          yes    >500 ms     0.403              +---------------+---------+------+-----------+------------+--------+          Summary:  Left:  - No evidence of deep vein thrombosis seen in the left lower extremity,  from the common femoral through the popliteal veins.  - No evidence of superficial venous thrombosis in the left lower  extremity.  - No evidence of deep vein reflux.  - Superficial vein reflux in the thigh extension of the SSV.         Assessment/Plan:     40 year old female here for evaluation of left lower extremity varicosities.  These are all lateral in her thigh and in her popliteal fossa and leg.  They do not appear to have any extension to superficial refluxing vein that is sizable enough for laser ablation.  She does have significant discomfort in her thigh and 1 area in the calf.  I discussed sclerotherapy as her primary treatment modality and she is agreeable to proceed.  Izora Gala discussed the risk benefits and alternatives and they are planning to schedule today.  She can follow-up with MD as needed.     Waynetta Sandy MD [facility]

## 2021-02-15 ENCOUNTER — Ambulatory Visit (INDEPENDENT_AMBULATORY_CARE_PROVIDER_SITE_OTHER): Payer: BC Managed Care – PPO | Admitting: Internal Medicine

## 2021-02-23 ENCOUNTER — Ambulatory Visit: Payer: BC Managed Care – PPO | Admitting: Internal Medicine

## 2021-03-01 ENCOUNTER — Encounter: Payer: Self-pay | Admitting: Physician Assistant

## 2021-03-01 ENCOUNTER — Other Ambulatory Visit: Payer: Self-pay

## 2021-03-01 ENCOUNTER — Ambulatory Visit (INDEPENDENT_AMBULATORY_CARE_PROVIDER_SITE_OTHER): Payer: BC Managed Care – PPO | Admitting: Physician Assistant

## 2021-03-01 DIAGNOSIS — L2089 Other atopic dermatitis: Secondary | ICD-10-CM

## 2021-03-01 MED ORDER — TRIAMCINOLONE ACETONIDE 0.1 % EX CREA: 1.0000 "application " | TOPICAL_CREAM | Freq: Every day | CUTANEOUS | 2 refills | Status: DC | PRN
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
## 2021-03-07 ENCOUNTER — Other Ambulatory Visit: Payer: Self-pay

## 2021-03-07 ENCOUNTER — Ambulatory Visit (INDEPENDENT_AMBULATORY_CARE_PROVIDER_SITE_OTHER): Payer: BC Managed Care – PPO | Admitting: Nurse Practitioner

## 2021-03-07 ENCOUNTER — Encounter: Payer: Self-pay | Admitting: Physician Assistant

## 2021-03-07 ENCOUNTER — Encounter: Payer: Self-pay | Admitting: Nurse Practitioner

## 2021-03-07 VITALS — BP 108/74 | HR 56 | Temp 98.0°F | Ht 62.0 in | Wt 148.6 lb

## 2021-03-07 DIAGNOSIS — K5900 Constipation, unspecified: Secondary | ICD-10-CM | POA: Diagnosis not present

## 2021-03-07 DIAGNOSIS — M174 Other bilateral secondary osteoarthritis of knee: Secondary | ICD-10-CM

## 2021-03-07 DIAGNOSIS — N938 Other specified abnormal uterine and vaginal bleeding: Secondary | ICD-10-CM | POA: Diagnosis not present

## 2021-03-07 DIAGNOSIS — R7989 Other specified abnormal findings of blood chemistry: Secondary | ICD-10-CM

## 2021-03-07 DIAGNOSIS — Z7689 Persons encountering health services in other specified circumstances: Secondary | ICD-10-CM | POA: Diagnosis not present

## 2021-03-07 DIAGNOSIS — M5417 Radiculopathy, lumbosacral region: Secondary | ICD-10-CM | POA: Diagnosis not present

## 2021-03-07 NOTE — Progress Notes (Signed)
   New Patient   Subjective  Traci Schmidt is a 40 y.o. female who presents for the following: Skin Problem (Left and right arm x 6-8 months- comes and goes- is very itchy tx- cerave healing ointment). Cerave helps to decrease itch. She does have seasonal allergies.   The following portions of the chart were reviewed this encounter and updated as appropriate:  Tobacco  Allergies  Meds  Problems  Med Hx  Surg Hx  Fam Hx      Objective  Well appearing patient in no apparent distress; mood and affect are within normal limits.  All skin waist up examined.  Left Forearm - Anterior, Mid Back, Right Forearm - Anterior Ill-defined pink hypopigmented and hyperpigmented xerotic scale.        Assessment & Plan  Other atopic dermatitis Left Forearm - Anterior; Right Forearm - Anterior; Mid Back  triamcinolone cream (KENALOG) 0.1 % - Left Forearm - Anterior, Mid Back, Right Forearm - Anterior Apply 1 application topically daily as needed.    I, Shylyn Younce, PA-C, have reviewed all documentation's for this visit.  The documentation on 03/07/21 for the exam, diagnosis, procedures and orders are all accurate and complete.

## 2021-03-08 ENCOUNTER — Ambulatory Visit (INDEPENDENT_AMBULATORY_CARE_PROVIDER_SITE_OTHER): Payer: Self-pay

## 2021-03-08 DIAGNOSIS — I8393 Asymptomatic varicose veins of bilateral lower extremities: Secondary | ICD-10-CM

## 2021-03-08 NOTE — Progress Notes (Signed)
Treated pt's spider and reticular veins on both legs with Asclera 1% administered with a 27g butterfly.  Patient received a total of 4 mL. Anticipate good results. Easy access. Pt tolerated well. Pt was given post procedure care instructions on both handout and verbally. We discussed the larger reticular veins may need to be treated again in future. Will follow PRN.  Photos: Yes.    Compression stockings applied: Yes.

## 2021-03-09 NOTE — Addendum Note (Signed)
Addended by: Robyne Askew R on: 03/09/2021 05:05 PM   Modules accepted: Level of Service

## 2021-03-11 DIAGNOSIS — R7989 Other specified abnormal findings of blood chemistry: Secondary | ICD-10-CM | POA: Insufficient documentation

## 2021-03-11 DIAGNOSIS — K5900 Constipation, unspecified: Secondary | ICD-10-CM | POA: Insufficient documentation

## 2021-03-11 NOTE — Assessment & Plan Note (Signed)
From review of record, it looks like she was started on NP thyroid in May 2022.  At that time, her TSH was 1.23, T3 and T4 were within normal range.  A couple of months ago, her TSH was rechecked and it was low.  I recommend discontinuing the NP thyroid at this time.  We can plan to recheck TSH at next visit, especially if her symptoms return or worsen.
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
## 2021-03-11 NOTE — Assessment & Plan Note (Signed)
Chronic.  She eats plenty of fiber and drinks plenty of water.  She has also tried Linzess and milk of magnesia without significant improvement.  Referral placed to gastroenterology for further work-up and treatment.

## 2021-03-11 NOTE — Assessment & Plan Note (Signed)
Improved with Mirena.  She asked me about hysterectomy today-we discussed the pros and cons of this.  I encouraged her to discuss this with her GYN when it is closer to time to get the Mirena out.
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
## 2021-03-22 ENCOUNTER — Encounter: Payer: Self-pay | Admitting: Gastroenterology

## 2021-03-23 ENCOUNTER — Ambulatory Visit: Payer: BC Managed Care – PPO | Admitting: Physician Assistant

## 2021-03-24 DIAGNOSIS — M5116 Intervertebral disc disorders with radiculopathy, lumbar region: Secondary | ICD-10-CM | POA: Diagnosis not present

## 2021-03-24 DIAGNOSIS — M5440 Lumbago with sciatica, unspecified side: Secondary | ICD-10-CM | POA: Diagnosis not present

## 2021-04-12 ENCOUNTER — Telehealth: Payer: Self-pay

## 2021-04-12 NOTE — Telephone Encounter (Signed)
Called pt to f/u after sclerotherapy. She is pleased with results. No questions/concerns at this time.

## 2021-04-13 DIAGNOSIS — Z981 Arthrodesis status: Secondary | ICD-10-CM | POA: Diagnosis not present

## 2021-04-13 DIAGNOSIS — Q7649 Other congenital malformations of spine, not associated with scoliosis: Secondary | ICD-10-CM | POA: Diagnosis not present

## 2021-04-13 DIAGNOSIS — M5116 Intervertebral disc disorders with radiculopathy, lumbar region: Secondary | ICD-10-CM | POA: Diagnosis not present

## 2021-04-13 DIAGNOSIS — M5440 Lumbago with sciatica, unspecified side: Secondary | ICD-10-CM | POA: Diagnosis not present

## 2021-04-14 DIAGNOSIS — Z9889 Other specified postprocedural states: Secondary | ICD-10-CM | POA: Diagnosis not present

## 2021-04-14 DIAGNOSIS — S334XXA Traumatic rupture of symphysis pubis, initial encounter: Secondary | ICD-10-CM | POA: Diagnosis not present

## 2021-04-15 DIAGNOSIS — M1612 Unilateral primary osteoarthritis, left hip: Secondary | ICD-10-CM | POA: Diagnosis not present

## 2021-04-18 ENCOUNTER — Encounter: Payer: Self-pay | Admitting: Gastroenterology

## 2021-04-18 ENCOUNTER — Ambulatory Visit (INDEPENDENT_AMBULATORY_CARE_PROVIDER_SITE_OTHER): Payer: BC Managed Care – PPO | Admitting: Gastroenterology

## 2021-04-18 VITALS — BP 102/72 | HR 95 | Ht 62.0 in | Wt 147.0 lb

## 2021-04-18 DIAGNOSIS — K5900 Constipation, unspecified: Secondary | ICD-10-CM

## 2021-04-18 DIAGNOSIS — R140 Abdominal distension (gaseous): Secondary | ICD-10-CM | POA: Diagnosis not present

## 2021-04-18 MED ORDER — TRULANCE 3 MG PO TABS: 3.0000 mg | ORAL_TABLET | Freq: Every day | ORAL | 0 refills | Status: DC
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
## 2021-04-18 NOTE — Patient Instructions (Addendum)
It was my pleasure to provide care to you today. Based on our discussion, I am providing you with my recommendations below:  RECOMMENDATION(S):   I recommend that you eat at least 25-30 grams of fiber daily and drink at least 64 ounces of water daily. You will want to gradually increase the fiber in your diet to avoid bloating. You may increase the fiber through diet and through fiber supplements including psyllium and methycellulose.  I recommend that you use Metamucil twice every day between now and when we see each other again.  Natural laxatives include prunes, apples, apricots, cherries, peaches, pears, aloe, rhubarb, kiwi, bananas, mango, papaya, and watermelon. In particular, two kiwi a day has been show to cause less likely to cause bloating than prunes or psyllium.  As long as you have healthy kidneys, another options is using magnesium oxide supplements. I recommend starting with 500 mg daily. You could increase the dose to 1000 mg daily after one week if that doesn't seem to be helping.   I am sending you home with some samples of Trulance. This is dosed at 3 mg daily. The most common side effect is diarrhea. With your history, I hope this doesn't happen to you.  I have recommended anorectal manometry as a way to figure out why you continue to have constipation and even diarrhea despite multiple treatment trials.   You have been scheduled to have an anorectal manometry at [facility] on Wednesday 07/20/21 at 8:30 am. Please arrive 30 minutes prior to your appointment time for registration (1st floor of the hospital-admissions).  Please make certain to use 1 Fleets enema 2 hours prior to coming for your appointment. You can purchase Fleets enemas from the laxative section at your drug store. You should not eat anything during the two hours prior to the procedure. You may take regular medications with small sips of water at least 2 hours prior to the study.  Anorectal manometry is  a test performed to evaluate patients with constipation or fecal incontinence. This test measures the pressures of the anal sphincter muscles, the sensation in the rectum, and the neural reflexes that are needed for normal bowel movements.  THE PROCEDURE The test takes approximately 30 minutes to 1 hour. You will be asked to change into a hospital gown. A technician or nurse will explain the procedure to you, take a brief health history, and answer any questions you may have. The patient then lies on his or her left side. A small, flexible tube, about the size of a thermometer, with a balloon at the end is inserted into the rectum. The catheter is connected to a machine that measures the pressure. During the test, the small balloon attached to the catheter may be inflated in the rectum to assess the normal reflex pathways. The nurse or technician may also ask the person to squeeze, relax, and push at various times. The anal sphincter muscle pressures are measured during each of these maneuvers. To squeeze, the patient tightens the sphincter muscles as if trying to prevent anything from coming out. To push or bear down, the patient strains down as if trying to have a bowel movement.  FOLLOW UP:  After your procedure, you will receive a call from my office staff regarding my recommendation for follow up.  BMI:  If you are age 61 or older, your body mass index should be between 23-30. Your Body mass index is 26.89 kg/m. If this is out of the aforementioned  range listed, please consider follow up with your Primary Care Provider.  If you are age 60 or younger, your body mass index should be between 19-25. Your Body mass index is 26.89 kg/m. If this is out of the aformentioned range listed, please consider follow up with your Primary Care Provider.   MY CHART:  The [facility] providers would like to encourage you to use [facility] to communicate with providers for non-urgent requests or questions.  Due  to long hold times on the telephone, sending your provider a message by [facility] may be a faster and more efficient way to get a response.  Please allow 48 business hours for a response.  Please remember that this is for non-urgent requests.   Thank you for trusting me with your gastrointestinal care!    Thornton Park, MD, MPH

## 2021-04-22 ENCOUNTER — Encounter: Payer: Self-pay | Admitting: Obstetrics and Gynecology

## 2021-04-22 DIAGNOSIS — N921 Excessive and frequent menstruation with irregular cycle: Secondary | ICD-10-CM

## 2021-04-22 MED ORDER — NORETHINDRONE ACETATE 5 MG PO TABS: ORAL_TABLET | ORAL | 0 refills | Status: DC

## 2021-04-22 NOTE — Telephone Encounter (Signed)
Patient scheduled on 05/05/21

## 2021-04-22 NOTE — Telephone Encounter (Signed)
Previously she had spotting with the IUD, heavy bleeding isn't normal. Please have her start Aygestin 5 mg BID until bleeding stops, then 1 tablet a day, #30, no refills.  Please set her up for an ultrasound in the next 2 weeks with a f/u visit with me.

## 2021-04-22 NOTE — Telephone Encounter (Signed)
Patient has Mirena IUD

## 2021-05-05 ENCOUNTER — Other Ambulatory Visit: Payer: Self-pay

## 2021-05-05 ENCOUNTER — Other Ambulatory Visit: Payer: BC Managed Care – PPO

## 2021-05-05 ENCOUNTER — Encounter: Payer: Self-pay | Admitting: Obstetrics and Gynecology

## 2021-05-05 ENCOUNTER — Ambulatory Visit (INDEPENDENT_AMBULATORY_CARE_PROVIDER_SITE_OTHER): Payer: BC Managed Care – PPO

## 2021-05-05 ENCOUNTER — Ambulatory Visit (INDEPENDENT_AMBULATORY_CARE_PROVIDER_SITE_OTHER): Payer: BC Managed Care – PPO | Admitting: Obstetrics and Gynecology

## 2021-05-05 ENCOUNTER — Other Ambulatory Visit: Payer: BC Managed Care – PPO | Admitting: Obstetrics and Gynecology

## 2021-05-05 VITALS — BP 118/82 | HR 64 | Ht 62.0 in | Wt 147.0 lb

## 2021-05-05 DIAGNOSIS — N921 Excessive and frequent menstruation with irregular cycle: Secondary | ICD-10-CM | POA: Diagnosis not present

## 2021-05-05 DIAGNOSIS — Z975 Presence of (intrauterine) contraceptive device: Secondary | ICD-10-CM

## 2021-05-05 DIAGNOSIS — N939 Abnormal uterine and vaginal bleeding, unspecified: Secondary | ICD-10-CM | POA: Diagnosis not present

## 2021-05-05 DIAGNOSIS — Z30431 Encounter for routine checking of intrauterine contraceptive device: Secondary | ICD-10-CM | POA: Diagnosis not present

## 2021-05-05 DIAGNOSIS — R7989 Other specified abnormal findings of blood chemistry: Secondary | ICD-10-CM | POA: Diagnosis not present

## 2021-05-05 NOTE — Progress Notes (Signed)
GYNECOLOGY  VISIT   HPI: 40 y.o.   Married Asian Not Hispanic or Latino  female   [phone] with Patient's last menstrual period was 04/15/2021 (approximate).   here for evaluation of AUB with the Letts IUD. Prior to IUD insertion she had a negative evaluation for AUB other than suspected adenomyosis. The IUD was placed in 4/20 for management of her AUB and contraception.  Prior to 1/22 she reported spotting for a couple of days every 2-3 months. Starting in January of 2022 her spotting went from 10 days of the month and progressively worsened to spotting the whole month of April, 2022. In 5/22 she was given a course of OCP's to try and stop her spotting. She only took the pills for one week secondary to nausea, but her bleeding stopped.   She had one or two normal cycles since 7/22 and prior to the end of 10/28.  She reached on 04/22/21 c/o having her period since 04/15/21 with heavy flow. She was changing saturated overnight pads 4-5 x in 24 hours.  She was started on Aygestin and scheduled for this visit. She took it for 4-5 days, bleeding stopped. She stopped the aygestin and she started spotting again.   She was on a low dose of synthroid earlier this year secondary to thyroid symptoms (normal TSH). Another provider took her off because her TSH was low. Not currently on medication.   H/O C/S x 3. H/O asymptomatic grade 2 cystocele and rectocele and grade 1 uterine prolapse.   GYNECOLOGIC HISTORY: Patient's last menstrual period was 04/15/2021 (approximate). Contraception:IUD  Menopausal hormone therapy: none         OB History     Gravida  3   Para  3   Term  3   Preterm  0   AB  0   Living  3      SAB  0   IAB  0   Ectopic  0   Multiple  0   Live Births  3              Patient Active Problem List   Diagnosis Date Noted   Constipation 03/11/2021   Abnormal TSH 03/11/2021   Iliopsoas bursitis of left hip 12/04/2020   Breast pain, left 11/30/2020   SI  (sacroiliac) pain 11/20/2020   S/P bilateral breast reduction 06/21/2020   Other bilateral secondary osteoarthritis of knee 06/03/2020   Acetabular labrum tear, left, subsequent encounter 05/02/2020   Acquired leg length discrepancy 04/17/2020   Neck pain 04/13/2020   Symptomatic mammary hypertrophy 04/13/2020   Female genital prolapse 09/04/2018   DUB (dysfunctional uterine bleeding) 07/23/2018   Fusion of spine of lumbar region 07/18/2018   Pelvic deformity 04/24/2018   Lumbosacral radiculopathy at L5 03/15/2018   Vitamin D deficiency 01/01/2018   Obesity (BMI 30-39.9) 06/23/2017    Past Medical History:  Diagnosis Date   Abnormal uterine bleeding    Allergy    seasonal   Fibroid    Gallstones    Thyroid disease     Past Surgical History:  Procedure Laterality Date   BALLOON DILATION N/A 11/12/2013   Procedure: BALLOON DILATION WITH STONE EXTRACTION;  Surgeon: Daneil Dolin, MD;  Location: [facility];  Service: Endoscopy;  Laterality: N/A;   BREAST REDUCTION SURGERY  05/2020   CESAREAN SECTION     CESAREAN SECTION N/A    [employer] 01/13/2020   cesarean section  2018   CHOLECYSTECTOMY N/A 11/05/2013  Dr. Arnoldo Morale   ERCP N/A 11/12/2013   Procedure: ENDOSCOPIC RETROGRADE CHOLANGIOPANCREATOGRAPHY (ERCP), SPHINCTEROTOMY, BALLOON DILATION WITH STONE EXTRACTION;  Surgeon: Daneil Dolin, MD;  Location: [facility];  Service: Endoscopy;  Laterality: N/A;   FRACTURE SURGERY N/A    [employer] 01/13/2020   HIP SURGERY Left 07/2020   Done at [place] N/A 11/12/2013   Procedure: Joan Mayans;  Surgeon: Daneil Dolin, MD;  Location: [facility];  Service: Endoscopy;  Laterality: N/A;    Current Outpatient Medications  Medication Sig Dispense Refill   acetaminophen (TYLENOL) 500 MG tablet Take 500 mg by mouth every 6 (six) hours as needed.     levonorgestrel (MIRENA, 52 MG,) 20 MCG/24HR IUD 1 each by Intrauterine route once.     meloxicam (MOBIC)  15 MG tablet Take 15 mg by mouth daily as needed.     norethindrone (AYGESTIN) 5 MG tablet Take 1 tablet by mouth twice daily until bleeding stops then 1 tablet by mouth daily. 30 tablet 0   Plecanatide (TRULANCE) 3 MG TABS Take 3 mg by mouth daily. 30 tablet 0   sennosides-docusate sodium (SENOKOT-S) 8.6-50 MG tablet Take 1 tablet by mouth in the morning, at noon, and at bedtime.     triamcinolone cream (KENALOG) 0.1 % Apply 1 application topically daily as needed. 453 g 2   No current facility-administered medications for this visit.     ALLERGIES: Other  Family History  Problem Relation Age of Onset   Hypertension Mother    Breast cancer Mother 35   Hyperlipidemia Father    Diabetes Father    Diabetes Brother    Hyperlipidemia Brother    Hyperlipidemia Brother    Colon cancer Neg Hx     Social History   Socioeconomic History   Marital status: Married    Spouse name: Not on file   Number of children: 3   Years of education: Not on file   Highest education level: Not on file  Occupational History   Not on file  Tobacco Use   Smoking status: Never   Smokeless tobacco: Never  Vaping Use   Vaping Use: Never used  Substance and Sexual Activity   Alcohol use: No   Drug use: No   Sexual activity: Yes    Birth control/protection: I.U.D.  Other Topics Concern   Not on file  Social History Narrative   Married for 8 years,lives with husband and 3 kids.Stay at home.   Social Determinants of Health   Financial Resource Strain: Not on file  Food Insecurity: Not on file  Transportation Needs: Not on file  Physical Activity: Not on file  Stress: Not on file  Social Connections: Not on file  Intimate Partner Violence: Not on file    Review of Systems  All other systems reviewed and are negative.  Pelvic ultrasound  Indications: AUB with mirena IUD  Findings:  Anteverted Uterus 7.34 x 5.29 x 4.76 cm  Endometrium 3.14 mm IUD in place  Left ovary 3.27 x 2.88 x  1.52 cm  Right ovary 2.93 x 2.43 x 1.84 cm  No free fluid  Impression:  Normal pelvic ultrasound IUD in place  PHYSICAL EXAMINATION:    LMP 04/15/2021 (Approximate)     General appearance: alert, cooperative and appears stated age  31. Abnormal uterine bleeding Etiology of heavy bleeding in unclear. Current bleeding has stopped, ultrasound is normal, IUD in place.  - CBC - TSH -  She has stopped the aygestin, she was going to restart it secondary to spotting. I told her not to restart it, just to keep track of the bleeding.  -Calendar all bleeding, call with recurrent episode of heavy bleeding.  -If she has recurrent heavy bleeding will retreat with aygestin  2. IUD check up In place on ultrasound  In addition to reviewing the ultrasound, history was obtained, labs were drawn and management was discussed.

## 2021-05-06 LAB — CBC
HCT: 42.2 % (ref 35.0–45.0)
Hemoglobin: 13.6 g/dL (ref 11.7–15.5)
MCH: 28 pg (ref 27.0–33.0)
MCHC: 32.2 g/dL (ref 32.0–36.0)
MCV: 86.8 fL (ref 80.0–100.0)
MPV: 9.6 fL (ref 7.5–12.5)
Platelets: 403 10*3/uL — ABNORMAL HIGH (ref 140–400)
RBC: 4.86 10*6/uL (ref 3.80–5.10)
RDW: 12.9 % (ref 11.0–15.0)
WBC: 9.2 10*3/uL (ref 3.8–10.8)

## 2021-05-06 LAB — TSH: TSH: 1.25 mIU/L

## 2021-05-18 ENCOUNTER — Encounter: Payer: Self-pay | Admitting: Nurse Practitioner

## 2021-05-18 ENCOUNTER — Telehealth: Payer: Self-pay | Admitting: Nurse Practitioner

## 2021-05-18 ENCOUNTER — Ambulatory Visit (INDEPENDENT_AMBULATORY_CARE_PROVIDER_SITE_OTHER): Payer: BC Managed Care – PPO | Admitting: Nurse Practitioner

## 2021-05-18 ENCOUNTER — Other Ambulatory Visit: Payer: Self-pay

## 2021-05-18 VITALS — BP 122/78 | HR 72 | Temp 97.3°F | Resp 18 | Ht 62.0 in | Wt 147.0 lb

## 2021-05-18 DIAGNOSIS — J069 Acute upper respiratory infection, unspecified: Secondary | ICD-10-CM

## 2021-05-18 DIAGNOSIS — Z298 Encounter for other specified prophylactic measures: Secondary | ICD-10-CM

## 2021-05-18 MED ORDER — DOXYCYCLINE HYCLATE 100 MG PO TABS
100.0000 mg | ORAL_TABLET | Freq: Every day | ORAL | 0 refills | Status: DC
Start: 2021-05-18 — End: 2021-05-18

## 2021-05-18 MED ORDER — DOXYCYCLINE HYCLATE 100 MG PO TABS: 100.0000 mg | ORAL_TABLET | Freq: Every day | ORAL | 0 refills | Status: AC

## 2021-05-18 NOTE — Progress Notes (Signed)
Subjective:    Patient ID: Traci Schmidt, female    DOB: August 05, 1980, 40 y.o.   MRN: [id number]  HPI: Traci Schmidt is a 40 y.o. female presenting for follow-up and nasal congestion.  Chief Complaint  Patient presents with   Follow-up    Antimalaria meds   Nasal Congestion    For several days   Cough    For several days, denies fever   UPPER RESPIRATORY TRACT INFECTION Onset: 2-3 days ago COVID-19 testing history: no covid test yet COVID-19 vaccination status: Has had 2 vaccines Fever: no Cough:  yes, congested Shortness of breath: no Wheezing: no Chest pain: no Chest tightness: no Chest congestion: yes Nasal congestion: yes Runny nose: no Post nasal drip: no Sneezing: no Sore throat: no Swollen glands: no Sinus pressure: no Headache: yes Face pain: no Toothache: no Ear pain: no  Ear pressure: no  Eyes red/itching:no Eye drainage/crusting: no  Nausea: no  Vomiting: no Diarrhea: no  Change in appetite: no  Loss of taste/smell: no  Rash: no Fatigue: no Sick contacts: no Strep contacts: no  Context: stable Recurrent sinusitis: no Treatments attempted:  nothing -she is trying to let it run its course  Patient reports she is traveling to [place] middle of next month to take son for treatment for autism.  She is requesting antimalarial medication prophylaxis.  Allergies  Allergen Reactions   Other     Seasonal allergies    Outpatient Encounter Medications as of 05/18/2021  Medication Sig   acetaminophen (TYLENOL) 500 MG tablet Take 500 mg by mouth every 6 (six) hours as needed.   levonorgestrel (MIRENA, 52 MG,) 20 MCG/24HR IUD 1 each by Intrauterine route once.   meloxicam (MOBIC) 15 MG tablet Take 15 mg by mouth daily as needed.   norethindrone (AYGESTIN) 5 MG tablet Take 1 tablet by mouth twice daily until bleeding stops then 1 tablet by mouth daily.   triamcinolone cream (KENALOG) 0.1 % Apply 1 application topically daily as  needed.   [DISCONTINUED] doxycycline (VIBRA-TABS) 100 MG tablet Take 1 tablet (100 mg total) by mouth daily. Start 1-2 before leaving, continue while in [place], and then continue for 1 week after return   doxycycline (VIBRA-TABS) 100 MG tablet Take 1 tablet (100 mg total) by mouth daily. Start 1-2 before leaving, continue while in [place], and then continue for 4 weeks after return to [place]   No facility-administered encounter medications on file as of 05/18/2021.    Patient Active Problem List   Diagnosis Date Noted   Acute left-sided low back pain with sciatica 03/24/2021   Intervertebral disc disorder with radiculopathy of lumbar region 03/24/2021   Constipation 03/11/2021   Abnormal TSH 03/11/2021   Iliopsoas bursitis of left hip 12/04/2020   Breast pain, left 11/30/2020   SI (sacroiliac) pain 11/20/2020   S/P bilateral breast reduction 06/21/2020   Other bilateral secondary osteoarthritis of knee 06/03/2020   Acetabular labrum tear, left, subsequent encounter 05/02/2020   Acquired leg length discrepancy 04/17/2020   Neck pain 04/13/2020   Symptomatic mammary hypertrophy 04/13/2020   Female genital prolapse 09/04/2018   DUB (dysfunctional uterine bleeding) 07/23/2018   Fusion of spine of lumbar region 07/18/2018   Pelvic deformity 04/24/2018   Lumbosacral radiculopathy at L5 03/15/2018   Vitamin D deficiency 01/01/2018   Obesity (BMI 30-39.9) 06/23/2017    Past Medical History:  Diagnosis Date   Abnormal uterine bleeding    Allergy    seasonal  Fibroid    Gallstones    Thyroid disease     Relevant past medical, surgical, family and social history reviewed and updated as indicated. Interim medical history since our last visit reviewed.  Review of Systems Per HPI unless specifically indicated above     Objective:    BP 122/78   Pulse 72   Temp (!) 97.3 F (36.3 C) (Temporal)   Resp 18   Ht 5\' 2"  (1.575 m)   Wt 147 lb (66.7 kg)   SpO2 97%   BMI 26.89 kg/m   Wt  Readings from Last 3 Encounters:  05/18/21 147 lb (66.7 kg)  05/05/21 147 lb (66.7 kg)  04/18/21 147 lb (66.7 kg)    Physical Exam Vitals and nursing note reviewed.  Constitutional:      General: She is not in acute distress.    Appearance: Normal appearance. She is not toxic-appearing.  HENT:     Head: Normocephalic and atraumatic.     Right Ear: Tympanic membrane, ear canal and external ear normal.     Left Ear: Tympanic membrane, ear canal and external ear normal.     Nose: Congestion present.     Right Sinus: No maxillary sinus tenderness or frontal sinus tenderness.     Left Sinus: No maxillary sinus tenderness or frontal sinus tenderness.     Mouth/Throat:     Mouth: Mucous membranes are moist.     Pharynx: Oropharynx is clear. Posterior oropharyngeal erythema present.  Eyes:     General: No scleral icterus.    Extraocular Movements: Extraocular movements intact.  Cardiovascular:     Rate and Rhythm: Normal rate and regular rhythm.     Heart sounds: Normal heart sounds. No murmur heard. Pulmonary:     Effort: Pulmonary effort is normal. No respiratory distress.     Breath sounds: Normal breath sounds. No wheezing, rhonchi or rales.  Musculoskeletal:     Cervical back: Normal range of motion.     Right lower leg: No edema.     Left lower leg: No edema.  Lymphadenopathy:     Cervical: No cervical adenopathy.  Skin:    General: Skin is warm and dry.     Capillary Refill: Capillary refill takes less than 2 seconds.     Coloration: Skin is not jaundiced or pale.     Findings: No erythema.  Neurological:     Mental Status: She is alert and oriented to person, place, and time.     Motor: No weakness.     Gait: Gait normal.  Psychiatric:        Mood and Affect: Mood normal.        Behavior: Behavior normal.        Thought Content: Thought content normal.        Judgment: Judgment normal.      Assessment & Plan:  1. Viral upper respiratory tract infection Acute x 3  days.  Viral testing obtained today. Reassured patient that symptoms and exam findings are most consistent with a viral upper respiratory infection and explained lack of efficacy of antibiotics against viruses.  Discussed expected course and features suggestive of secondary bacterial infection.  Continue supportive care. Increase fluid intake with water or electrolyte solution like pedialyte. Encouraged acetaminophen as needed for fever/pain. Encouraged salt water gargling, chloraseptic spray and throat lozenges. Encouraged OTC guaifenesin. Encouraged saline sinus flushes and/or neti with humidified air.  Follow up with no improvement in symptoms after 7 to 10  days.  - SARS-CoV-2 RNA (COVID-19) and Respiratory Viral Panel, Qualitative NAAT  2. Need for malaria prophylaxis Start doxycycline per CDC guidelines and travel to endemic area for malaria is present.  - doxycycline (VIBRA-TABS) 100 MG tablet; Take 1 tablet (100 mg total) by mouth daily. Start 1-2 before leaving, continue while in [place], and then continue for 4 weeks after return to [place]  Dispense: 65 tablet; Refill: 0    Follow up plan: Return in about 1 year (around 05/18/2022), or if symptoms worsen or fail to improve.

## 2021-05-22 LAB — SARS-COV-2 RNA (COVID-19) RESP VIRAL PNL QL NAAT
Adenovirus B: NOT DETECTED
HUMAN PARAINFLU VIRUS 1: DETECTED — AB
HUMAN PARAINFLU VIRUS 2: NOT DETECTED
HUMAN PARAINFLU VIRUS 3: NOT DETECTED
INFLUENZA A SUBTYPE H1: NOT DETECTED
INFLUENZA A SUBTYPE H3: NOT DETECTED
Influenza A: NOT DETECTED
Influenza B: NOT DETECTED
Metapneumovirus: NOT DETECTED
Respiratory Syncytial Virus A: NOT DETECTED
Respiratory Syncytial Virus B: NOT DETECTED
Rhinovirus: NOT DETECTED
SARS CoV2 RNA: NOT DETECTED

## 2021-05-25 ENCOUNTER — Other Ambulatory Visit: Payer: Self-pay | Admitting: Obstetrics and Gynecology

## 2021-05-25 NOTE — Telephone Encounter (Signed)
I think it is fine for her to take the aygestin daily. I've called in a 90 day supply. If she has to she can double up on the Aygestin when she is in [place].

## 2021-05-25 NOTE — Telephone Encounter (Signed)
I called patient to confirm that she requested this refill and not just a pharmacy request.  SHe said she did request it. She said she took the Rx you gave her 04/22/21 and finished it 3 days ago. For 2 days she did not bleed but on 3rd day last night she started bleeding and she said it seems like it is going to be heavy again. Her concern is she is leaving for [place] on 05/31/21 and will not return for a month. She asked if you would call in 2 months worth?  She also ask if you do refill it if she should take it continuously or should she stop at points?

## 2021-05-25 NOTE — Telephone Encounter (Signed)
Spoke with patient and informed her. °

## 2021-05-31 ENCOUNTER — Encounter: Payer: Self-pay | Admitting: Nurse Practitioner

## 2021-05-31 DIAGNOSIS — M1612 Unilateral primary osteoarthritis, left hip: Secondary | ICD-10-CM | POA: Diagnosis not present

## 2021-07-12 ENCOUNTER — Encounter (HOSPITAL_COMMUNITY): Payer: Self-pay | Admitting: Gastroenterology

## 2021-07-12 ENCOUNTER — Encounter: Payer: Self-pay | Admitting: *Deleted

## 2021-07-12 DIAGNOSIS — M533 Sacrococcygeal disorders, not elsewhere classified: Secondary | ICD-10-CM | POA: Diagnosis not present

## 2021-07-12 DIAGNOSIS — M4326 Fusion of spine, lumbar region: Secondary | ICD-10-CM | POA: Diagnosis not present

## 2021-07-13 ENCOUNTER — Other Ambulatory Visit: Payer: Self-pay | Admitting: Obstetrics and Gynecology

## 2021-07-13 DIAGNOSIS — Z1231 Encounter for screening mammogram for malignant neoplasm of breast: Secondary | ICD-10-CM

## 2021-07-20 ENCOUNTER — Encounter (HOSPITAL_COMMUNITY)
Admission: RE | Disposition: A | Discharge status: Home / Self Care | Payer: Self-pay | Source: Home / Self Care | Attending: Gastroenterology

## 2021-07-20 ENCOUNTER — Ambulatory Visit (HOSPITAL_COMMUNITY)
Admission: RE | Admit: 2021-07-20 | Discharge: 2021-07-20 | Disposition: A | Discharge status: Home / Self Care | Payer: BC Managed Care – PPO | Attending: Gastroenterology | Admitting: Gastroenterology

## 2021-07-20 DIAGNOSIS — K5902 Outlet dysfunction constipation: Secondary | ICD-10-CM | POA: Diagnosis not present

## 2021-07-20 DIAGNOSIS — K6289 Other specified diseases of anus and rectum: Secondary | ICD-10-CM | POA: Diagnosis not present

## 2021-07-20 DIAGNOSIS — K5900 Constipation, unspecified: Secondary | ICD-10-CM | POA: Diagnosis not present

## 2021-07-20 HISTORY — PX: ANAL RECTAL MANOMETRY: SHX6358

## 2021-07-20 SURGERY — MANOMETRY, ANORECTAL

## 2021-07-20 NOTE — Progress Notes (Signed)
Anal manometry done per protocol. Pt tolerated well without distress or complication . A. Bell in room during procedure as 2nd RN

## 2021-07-21 ENCOUNTER — Encounter (HOSPITAL_COMMUNITY): Payer: Self-pay | Admitting: Gastroenterology

## 2021-07-29 ENCOUNTER — Ambulatory Visit
Admission: RE | Admit: 2021-07-29 | Discharge: 2021-07-29 | Disposition: A | Discharge status: Home / Self Care | Payer: BC Managed Care – PPO | Source: Ambulatory Visit | Attending: Obstetrics and Gynecology | Admitting: Obstetrics and Gynecology

## 2021-07-29 DIAGNOSIS — Z1231 Encounter for screening mammogram for malignant neoplasm of breast: Secondary | ICD-10-CM | POA: Diagnosis not present

## 2021-07-29 NOTE — Telephone Encounter (Signed)
Erroneous encounter. Please disregard.

## 2021-08-01 ENCOUNTER — Other Ambulatory Visit: Payer: Self-pay

## 2021-08-01 DIAGNOSIS — K6289 Other specified diseases of anus and rectum: Secondary | ICD-10-CM

## 2021-08-02 ENCOUNTER — Other Ambulatory Visit: Payer: Self-pay

## 2021-08-02 ENCOUNTER — Ambulatory Visit: Payer: BC Managed Care – PPO | Attending: Gastroenterology | Admitting: Physical Therapy

## 2021-08-02 ENCOUNTER — Encounter: Payer: Self-pay | Admitting: Physical Therapy

## 2021-08-02 DIAGNOSIS — K6289 Other specified diseases of anus and rectum: Secondary | ICD-10-CM | POA: Diagnosis not present

## 2021-08-02 DIAGNOSIS — R279 Unspecified lack of coordination: Secondary | ICD-10-CM

## 2021-08-02 DIAGNOSIS — R293 Abnormal posture: Secondary | ICD-10-CM

## 2021-08-02 DIAGNOSIS — M6281 Muscle weakness (generalized): Secondary | ICD-10-CM

## 2021-08-02 NOTE — Patient Instructions (Addendum)
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
## 2021-08-09 ENCOUNTER — Telehealth: Payer: Self-pay

## 2021-08-09 NOTE — Telephone Encounter (Signed)
Per Dr. Tarri Glenn request, called this pt and advised to contact her GYN re: these concerns. Also informed that her PT has been made aware as well. Verbalized acceptance and understanding. No further action required from GI perspective.

## 2021-08-09 NOTE — Telephone Encounter (Signed)
-----   Message from Thornton Park, MD sent at 08/09/2021 10:43 AM EST ----- Thanks for checking in. Yes, that is okay with me.  Kyley Solow, please ask the patient to follow-up with her GYN given her concurrent symptoms.   KLB ----- Message ----- From: Junie Panning, PT Sent: 08/02/2021  12:11 PM EST To: Thornton Park, MD  Hello, I have seen this patient today for pelvic floor evaluation with referral from Dr. Tarri Glenn for "anal sphincter incompetence" with history of constipation and she had a recent anal manometry stating there was weakness at the internal sphincter and hyposensitivity. I completed the eval today and will further treat this of course but she also reported pain internally with intercourse and some urinary leakage with urgency and frequency. I just wanted to clear with you guys before doing a vaginal pelvic floor assessment and treating this as well as the referral was specifically for anal sphincter incompetence.    Thank you, Stacy Gardner, PT, DPT 08/02/2310:11 PM

## 2021-08-12 ENCOUNTER — Other Ambulatory Visit: Payer: Self-pay

## 2021-08-12 ENCOUNTER — Ambulatory Visit: Payer: BC Managed Care – PPO | Admitting: Physical Therapy

## 2021-08-12 DIAGNOSIS — K6289 Other specified diseases of anus and rectum: Secondary | ICD-10-CM | POA: Diagnosis not present

## 2021-08-12 DIAGNOSIS — M6281 Muscle weakness (generalized): Secondary | ICD-10-CM

## 2021-08-12 DIAGNOSIS — R293 Abnormal posture: Secondary | ICD-10-CM

## 2021-08-12 DIAGNOSIS — R279 Unspecified lack of coordination: Secondary | ICD-10-CM

## 2021-08-12 NOTE — Therapy (Signed)
[facility] @ [address] [place], [place], [address] Phone: [phone]   Fax:  [phone]  Physical Therapy Treatment  Patient Details  Name: Traci Schmidt MRN: [id number] Date of Birth: 02-24-1981 Referring Provider (PT): Thornton Park, MD   Encounter Date: 08/12/2021   PT End of Session - 08/12/21 1020     Visit Number 2    Date for PT Re-Evaluation 10/30/21    Authorization Type BCBS    PT Start Time 0930    PT Stop Time 1013    PT Time Calculation (min) 43 min    Activity Tolerance Patient tolerated treatment well    Behavior During Therapy [facility] for tasks assessed/performed             Past Medical History:  Diagnosis Date   Abnormal uterine bleeding    Allergy    seasonal   Fibroid    Gallstones    Thyroid disease     Past Surgical History:  Procedure Laterality Date   ANAL RECTAL MANOMETRY N/A 07/20/2021   Procedure: ANO RECTAL MANOMETRY;  Surgeon: Thornton Park, MD;  [facility];  Service: Gastroenterology;  Laterality: N/A;   BALLOON DILATION N/A 11/12/2013   Procedure: BALLOON DILATION WITH STONE EXTRACTION;  Surgeon: Daneil Dolin, MD;  Location: [facility];  Service: Endoscopy;  Laterality: N/A;   BREAST REDUCTION SURGERY  05/2020   CESAREAN SECTION     CESAREAN SECTION N/A    Phreesia 01/13/2020   cesarean section  2018   CHOLECYSTECTOMY N/A 11/05/2013   Dr. Arnoldo Morale   ERCP N/A 11/12/2013   Procedure: ENDOSCOPIC RETROGRADE CHOLANGIOPANCREATOGRAPHY (ERCP), SPHINCTEROTOMY, BALLOON DILATION WITH STONE EXTRACTION;  Surgeon: Daneil Dolin, MD;  Location: [facility];  Service: Endoscopy;  Laterality: N/A;   FRACTURE SURGERY N/A    Phreesia 01/13/2020   HIP SURGERY Left 07/2020   Done at [place] N/A 11/12/2013   Procedure: Joan Mayans;  Surgeon: Daneil Dolin, MD;  Location: [facility];  Service: Endoscopy;  Laterality: N/A;     There were no vitals filed for this visit.   Subjective Assessment - 08/12/21 0934     Subjective Pt reports she has increased steamed veg intake and drinking one protein shake per day instead of 2, now having BMs every other day without medications and pt is trying abdominal massage but not reguarly.    Currently in Pain? No/denies                            Pelvic Floor Special Questions - 08/12/21 0001     External Perineal Exam WFL however post pelvic surgery in 2002 does have alignment of pubic bone difference.    Prolapse Anterior Wall   appears to be grade 2 in hooklying   Pelvic Floor Internal Exam patient identified and patient confirms consent for PT to perform internal soft tissue work and muscle strength and integrity assessment    Exam Type Vaginal    Sensation WFL    Palpation no TTP    Strength weak squeeze, no lift    Strength # of reps 6    Strength # of seconds 4    Tone decreased               [facility] Adult PT Treatment/Exercise - 08/12/21 0001       Self-Care   Self-Care  Other Self-Care Comments    Other Self-Care Comments  Pt educated on HEP and pelvic floor exercises for improved strength for decreased leakage. Abdominal massage also went over to improve peristalsis      Neuro Re-ed    Neuro Re-ed Details  Pt directed in x10 diaphragmatic breathing for improved carry over for HEP and during pelvic floor assessment x5 reps of contractions with decreased compensatory strategies cued, and coordination with breathing                     PT Education - 08/12/21 1015     Education Details Pt educated on breathing mechanics, HEP, and technique for pelvic floor contractions/relaxation without compensatory strategies. Pt also educated on abdominal massage    Person(s) Educated Patient    Methods Explanation;Demonstration;Tactile cues;Verbal cues;Handout    Comprehension Verbalized understanding;Returned demonstration               PT Short Term Goals - 08/02/21 1223       PT SHORT TERM GOAL #1   Title pt to be I with HEP    Time 4    Period Weeks    Status New    Target Date 08/30/21      PT SHORT TERM GOAL #2   Title pt to recall good voiding mechanics without cues 100% of the time.    Time 4    Period Weeks    Status New    Target Date 08/30/21      PT SHORT TERM GOAL #3   Title pt to demonstrate at least 3/5 pelvic floor strength with ability to hold contraction for 8s and ability to relax/bulge after contraction for improved pelvic floor mobility    Time 4    Period Weeks    Status New    Target Date 08/30/21               PT Long Term Goals - 08/02/21 1224       PT LONG TERM GOAL #1   Title pt to be I with advanced HEP    Time 3    Period Months    Status New    Target Date 10/30/21      PT LONG TERM GOAL #2   Title pt to demonstrate at least 4/5 pelvic floor strength with ability to hold contraction for 15s and ability to relax/bulge after contraction for improved pelvic floor mobility    Time 3    Period Months    Status New    Target Date 10/30/21      PT LONG TERM GOAL #3   Title Pt to report improved bowel regularity to at least 3 BMs per week without straining and types 3-4 for improved bowel health.    Time 3    Period Months    Status New    Target Date 10/30/21      PT LONG TERM GOAL #4   Title pt to report no more than 1 urinary leakage instance per month for decrease symptoms and ability to hold urine at least 3 hours without leakage for improved QOL.    Time 3    Period Months    Status New    Target Date 10/30/21                   Plan - 08/12/21 1016     Clinical Impression Statement Pt reports she has been trying step stool in bathroom, change  in diet and drinking more water and has seen improvement with BMs every other day without medication. Pt session focused on internal vaginal pelvic floor assessment and found to have weakness,  decreased coordination and endurance and initially had compensatory strategies for holding breath and overuse of glutes. Pt improved with cues, NMRE for coordination of pelvic floor and breathing and education on this and diaphragmatic breathing for home. Pt given HEP and handout on abdominal massage and all went over. All questions answered and denied additional questions/cocerns at end of session. Pt would benfit from additional PT to further address deficits.    Personal Factors and Comorbidities Time since onset of injury/illness/exacerbation;Comorbidity 1    Comorbidities x3 c-sections, urinary leakage and urgency and conspitation    Examination-Activity Limitations Continence;Lift;Other   intercourse, voiding   Examination-Participation Restrictions Community Activity;Interpersonal Relationship    Stability/Clinical Decision Making Stable/Uncomplicated    Rehab Potential Good    PT Frequency 1x / week    PT Duration Other (comment)   10 visits   PT Treatment/Interventions ADLs/Self Care Home Management;Functional mobility training;Therapeutic activities;Therapeutic exercise;Neuromuscular re-education;Manual techniques;Cryotherapy;Moist Heat;Taping;Patient/family education;Passive range of motion;Energy conservation;Dry needling    PT Next Visit Plan internal as needed, go over all handouts    Consulted and Agree with Plan of Care Patient             Patient will benefit from skilled therapeutic intervention in order to improve the following deficits and impairments:  Decreased coordination, Decreased endurance, Increased fascial restricitons, Impaired tone, Impaired flexibility, Improper body mechanics, Postural dysfunction, Impaired sensation, Decreased strength, Decreased mobility  Visit Diagnosis: Muscle weakness (generalized)  Abnormal posture  Unspecified lack of coordination     Problem List Patient Active Problem List   Diagnosis Date Noted   Acute left-sided low back  pain with sciatica 03/24/2021   Intervertebral disc disorder with radiculopathy of lumbar region 03/24/2021   Constipation 03/11/2021   Abnormal TSH 03/11/2021   Iliopsoas bursitis of left hip 12/04/2020   Breast pain, left 11/30/2020   SI (sacroiliac) pain 11/20/2020   S/P bilateral breast reduction 06/21/2020   Other bilateral secondary osteoarthritis of knee 06/03/2020   Acetabular labrum tear, left, subsequent encounter 05/02/2020   Acquired leg length discrepancy 04/17/2020   Neck pain 04/13/2020   Symptomatic mammary hypertrophy 04/13/2020   Female genital prolapse 09/04/2018   DUB (dysfunctional uterine bleeding) 07/23/2018   Fusion of spine of lumbar region 07/18/2018   Pelvic deformity 04/24/2018   Lumbosacral radiculopathy at L5 03/15/2018   Vitamin D deficiency 01/01/2018   Obesity (BMI 30-39.9) 06/23/2017    No emotional/communication barriers or cognitive limitation. Patient is motivated to learn. Patient understands and agrees with treatment goals and plan. PT explains patient will be examined in standing, sitting, and lying down to see how their muscles and joints work. When they are ready, they will be asked to remove their underwear so PT can examine their perineum. The patient is also given the option of providing their own chaperone as one is not provided in our facility. The patient also has the right and is explained the right to defer or refuse any part of the evaluation or treatment including the internal exam. With the patient's consent, PT will use one gloved finger to gently assess the muscles of the pelvic floor, seeing how well it contracts and relaxes and if there is muscle symmetry. After, the patient will get dressed and PT and patient will discuss exam findings and plan of care. PT  and patient discuss plan of care, schedule, attendance policy and HEP activities.   Stacy Gardner, PT, DPT 08/12/2308:20 AM   [facility] @ [address] [place], [place], [address] Phone: [phone]   Fax:  [phone]  Name: Traci Schmidt MRN: [id number] Date of Birth: 1981-04-18

## 2021-08-19 ENCOUNTER — Other Ambulatory Visit: Payer: Self-pay

## 2021-08-19 ENCOUNTER — Encounter: Payer: Self-pay | Admitting: Physical Therapy

## 2021-08-19 ENCOUNTER — Ambulatory Visit: Payer: BC Managed Care – PPO | Attending: Gastroenterology | Admitting: Physical Therapy

## 2021-08-19 DIAGNOSIS — M6281 Muscle weakness (generalized): Secondary | ICD-10-CM | POA: Diagnosis not present

## 2021-08-19 DIAGNOSIS — R279 Unspecified lack of coordination: Secondary | ICD-10-CM | POA: Diagnosis not present

## 2021-08-19 DIAGNOSIS — R293 Abnormal posture: Secondary | ICD-10-CM | POA: Diagnosis not present

## 2021-08-19 NOTE — Therapy (Signed)
[facility] @ [address] [place], [place], [address] Phone: [phone]   Fax:  [phone]  Physical Therapy Treatment  Patient Details  Name: Traci Schmidt MRN: [id number] Date of Birth: April 10, 1981 Referring Provider (PT): Thornton Park, MD   Encounter Date: 08/19/2021   PT End of Session - 08/19/21 0928     Visit Number 3    Date for PT Re-Evaluation 10/30/21    Authorization Type BC[employer]    PT Start Time 0930    PT Stop Time 1010    PT Time Calculation (min) 40 min    Activity Tolerance Patient tolerated treatment well    Behavior During Therapy [facility] for tasks assessed/performed             Past Medical History:  Diagnosis Date   Abnormal uterine bleeding    Allergy    seasonal   Fibroid    Gallstones    Thyroid disease     Past Surgical History:  Procedure Laterality Date   ANAL RECTAL MANOMETRY N/A 07/20/2021   Procedure: ANO RECTAL MANOMETRY;  Surgeon: Thornton Park, MD;  Location: [facility];  Service: Gastroenterology;  Laterality: N/A;   BALLOON DILATION N/A 11/12/2013   Procedure: BALLOON DILATION WITH STONE EXTRACTION;  Surgeon: Daneil Dolin, MD;  Location: [facility];  Service: Endoscopy;  Laterality: N/A;   BREAST REDUCTION SURGERY  05/2020   CESAREAN SECTION     CESAREAN SECTION N/A    Phreesia 01/13/2020   cesarean section  2018   CHOLECYSTECTOMY N/A 11/05/2013   Dr. Arnoldo Morale   ERCP N/A 11/12/2013   Procedure: ENDOSCOPIC RETROGRADE CHOLANGIOPANCREATOGRAPHY (ERCP), SPHINCTEROTOMY, BALLOON DILATION WITH STONE EXTRACTION;  Surgeon: Daneil Dolin, MD;  Location: [facility];  Service: Endoscopy;  Laterality: N/A;   FRACTURE SURGERY N/A    Phreesia 01/13/2020   HIP SURGERY Left 07/2020   Done at [place] N/A 11/12/2013   Procedure: Joan Mayans;  Surgeon: Daneil Dolin, MD;  Location: [facility];  Service: Endoscopy;  Laterality: N/A;     There were no vitals filed for this visit.   Subjective Assessment - 08/19/21 0931     Subjective Pt reports she has been implementing diet changes, is now having 1 BM per day without straining did need to take medication one time but that is it, abdominal massage has been very helpful.    Currently in Pain? No/denies                               [facility] Adult PT Treatment/Exercise - 08/19/21 0001       Self-Care   Self-Care Other Self-Care Comments    Other Self-Care Comments  Pt educated on urge drill and knack method with handout given      Exercises   Exercises Lumbar;Knee/Hip      Lumbar Exercises: Stretches   Passive Hamstring Stretch Right;Left;2 reps;30 seconds    ITB Stretch Right;Left;2 reps;10 seconds    Other Lumbar Stretch Exercise adductor stretch with strap 2x30s      Lumbar Exercises: Aerobic   Elliptical --      Lumbar Exercises: Standing   Functional Squats 20 reps    Functional Squats Limitations 10# kettle bell with coordinated pelvic floor and breathing    Other Standing Lumbar Exercises blue band palloffs x20 each; palloff rotation x20 blue band  Lumbar Exercises: Seated   Other Seated Lumbar Exercises opp arm/knee press x10 with exhale      Lumbar Exercises: Quadruped   Opposite Arm/Leg Raise Right arm/Left leg;Left arm/Right leg;10 reps                     PT Education - 08/19/21 0937     Education Details Pt educated on knack method, breathing mechanics and core activation with activity for coordination    Person(s) Educated Patient    Methods Explanation;Demonstration;Tactile cues;Verbal cues;Handout    Comprehension Returned demonstration;Verbalized understanding              PT Short Term Goals - 08/02/21 1223       PT SHORT TERM GOAL #1   Title pt to be I with HEP    Time 4    Period Weeks    Status New    Target Date 08/30/21      PT SHORT TERM GOAL #2   Title pt to recall good  voiding mechanics without cues 100% of the time.    Time 4    Period Weeks    Status New    Target Date 08/30/21      PT SHORT TERM GOAL #3   Title pt to demonstrate at least 3/5 pelvic floor strength with ability to hold contraction for 8s and ability to relax/bulge after contraction for improved pelvic floor mobility    Time 4    Period Weeks    Status New    Target Date 08/30/21               PT Long Term Goals - 08/02/21 1224       PT LONG TERM GOAL #1   Title pt to be I with advanced HEP    Time 3    Period Months    Status New    Target Date 10/30/21      PT LONG TERM GOAL #2   Title pt to demonstrate at least 4/5 pelvic floor strength with ability to hold contraction for 15s and ability to relax/bulge after contraction for improved pelvic floor mobility    Time 3    Period Months    Status New    Target Date 10/30/21      PT LONG TERM GOAL #3   Title Pt to report improved bowel regularity to at least 3 BMs per week without straining and types 3-4 for improved bowel health.    Time 3    Period Months    Status New    Target Date 10/30/21      PT LONG TERM GOAL #4   Title pt to report no more than 1 urinary leakage instance per month for decrease symptoms and ability to hold urine at least 3 hours without leakage for improved QOL.    Time 3    Period Months    Status New    Target Date 10/30/21                   Plan - 08/19/21 1003     Clinical Impression Statement Pt presents to clinic reporting she is seeing improvements with constipation overall and going about 1 time per day without straining and only needed to use medication one time, has implemented diet changes and abdominal massage has been very helpful. Pt session focused on education on urge drill, knack method for improved urinary leakage symptoms and ther ex for core  and hip strengthening with coordinating pelvic floor and breathing for improved ability to support self without leakage  during activity. Pt tolerated well with minimal tactile and verbal cues. Pt would benfit from additional PT to further address deficits.    Personal Factors and Comorbidities Time since onset of injury/illness/exacerbation;Comorbidity 1    Comorbidities x3 c-sections, urinary leakage and urgency and conspitation    Examination-Activity Limitations Continence;Lift;Other   intercourse, voiding   Examination-Participation Restrictions Community Activity;Interpersonal Relationship    Stability/Clinical Decision Making Stable/Uncomplicated    Rehab Potential Good    PT Frequency 1x / week    PT Duration Other (comment)   10 visits   PT Treatment/Interventions ADLs/Self Care Home Management;Functional mobility training;Therapeutic activities;Therapeutic exercise;Neuromuscular re-education;Manual techniques;Cryotherapy;Moist Heat;Taping;Patient/family education;Passive range of motion;Energy conservation;Dry needling    PT Next Visit Plan continue to implement pelvic floor strengthening and coordination with all exercises    Consulted and Agree with Plan of Care Patient             Patient will benefit from skilled therapeutic intervention in order to improve the following deficits and impairments:  Decreased coordination, Decreased endurance, Increased fascial restricitons, Impaired tone, Impaired flexibility, Improper body mechanics, Postural dysfunction, Impaired sensation, Decreased strength, Decreased mobility  Visit Diagnosis: Muscle weakness (generalized)  Abnormal posture  Unspecified lack of coordination     Problem List Patient Active Problem List   Diagnosis Date Noted   Acute left-sided low back pain with sciatica 03/24/2021   Intervertebral disc disorder with radiculopathy of lumbar region 03/24/2021   Constipation 03/11/2021   Abnormal TSH 03/11/2021   Iliopsoas bursitis of left hip 12/04/2020   Breast pain, left 11/30/2020   SI (sacroiliac) pain 11/20/2020   S/P  bilateral breast reduction 06/21/2020   Other bilateral secondary osteoarthritis of knee 06/03/2020   Acetabular labrum tear, left, subsequent encounter 05/02/2020   Acquired leg length discrepancy 04/17/2020   Neck pain 04/13/2020   Symptomatic mammary hypertrophy 04/13/2020   Female genital prolapse 09/04/2018   DUB (dysfunctional uterine bleeding) 07/23/2018   Fusion of spine of lumbar region 07/18/2018   Pelvic deformity 04/24/2018   Lumbosacral radiculopathy at L5 03/15/2018   Vitamin D deficiency 01/01/2018   Obesity (BMI 30-39.9) 06/23/2017    Stacy Gardner, PT, DPT 08/19/2308:12 AM   [facility] @ [address] [place] [place], [place], [address] Phone: [phone]   Fax:  [phone]  Name: Traci Schmidt MRN: [id number] Date of Birth: 1981-05-28

## 2021-08-19 NOTE — Patient Instructions (Addendum)
?  THE KNACK ? ?The Knack is a strategy you may use to help to reduce or prevent leakage or passing of urine, gas or feces during an activity that causes downward force on the pelvic floor muscles.   ? ?Activities that can cause downward pressure on the pelvic floor muscles include coughing, sneezing, laughing, bending, lifting, and transitioning from different body positions such as from laying down to sitting up and sitting to standing. ? ?To perform The Knack, consciously squeeze and lift your pelvic floor muscles to perform a strong, well-timed pelvic muscle contraction BEFORE AND DURING these activities above.  As your contraction gets more coordinated and your muscles get stronger, you will become more effective in controlling your experience of incontinence or gas passing during these activities.   ? ? ?Urge Incontinence ? ?Ideal urination frequency is every 2-4 wakeful hours, which equates to 5-8 times within a 24-hour period.   ?Urge incontinence is leakage that occurs when the bladder muscle contracts, creating a sudden need to go before getting to the bathroom.   ?Going too often when your bladder isn't actually full can disrupt the body's automatic signals to store and hold urine longer, which will increase urgency/frequency.  In this case, the bladder ?is running the show? and strategies can be learned to retrain this pattern.   ?One should be able to control the first urge to urinate, at around 138mL.  The bladder can hold up to a ?grande latte,? or 455mL. ?To help you gain control, practice the Urge Drill below when urgency strikes.  This drill will help retrain your bladder signals and allow you to store and hold urine longer.  The overall goal is to stretch out your time between voids to reach a more manageable voiding schedule.    ?Practice your "quick flicks" often throughout the day (each waking hour) even when you don't need feel the urge to go.  This will help strengthen your pelvic floor  muscles, making them more effective in controlling leakage. ? ?Urge Drill ? ?When you feel an urge to go, follow these steps to regain control: ?Stop what you are doing and be still ?Take one deep breath, directing your air into your abdomen ?Think an affirming thought, such as ?I've got this.? ?Do 5 quick flicks of your pelvic floor ?Walk with control to the bathroom to void, or delay voiding ? ? ?

## 2021-08-25 DIAGNOSIS — N926 Irregular menstruation, unspecified: Secondary | ICD-10-CM | POA: Diagnosis not present

## 2021-08-25 DIAGNOSIS — N920 Excessive and frequent menstruation with regular cycle: Secondary | ICD-10-CM | POA: Diagnosis not present

## 2021-08-25 DIAGNOSIS — Z6826 Body mass index (BMI) 26.0-26.9, adult: Secondary | ICD-10-CM | POA: Diagnosis not present

## 2021-08-25 DIAGNOSIS — Z30431 Encounter for routine checking of intrauterine contraceptive device: Secondary | ICD-10-CM | POA: Diagnosis not present

## 2021-08-26 ENCOUNTER — Ambulatory Visit: Payer: BC Managed Care – PPO | Admitting: Physical Therapy

## 2021-09-01 ENCOUNTER — Encounter: Payer: BC Managed Care – PPO | Admitting: Physical Therapy

## 2021-09-02 ENCOUNTER — Ambulatory Visit: Payer: BC Managed Care – PPO | Admitting: Gastroenterology

## 2021-09-02 ENCOUNTER — Encounter: Payer: Self-pay | Admitting: Gastroenterology

## 2021-09-02 VITALS — BP 102/70 | HR 66 | Ht 62.0 in | Wt 148.0 lb

## 2021-09-02 DIAGNOSIS — K6289 Other specified diseases of anus and rectum: Secondary | ICD-10-CM
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
## 2021-09-02 NOTE — Patient Instructions (Addendum)
It was my pleasure to provide care to you today. Based on our discussion, I am providing you with my recommendations below: ? ?RECOMMENDATION(S):  ? ?Please send me a MyChart message when you finish PT with an update in how you are feeling. ? ?You should plan to have a colonoscopy at age 41. ? ?Please let me know if I can help in any other way.  ? ?FOLLOW UP: ? ?I would like for you to follow up with me in as needed . Please call the office at [phone] to schedule your appointment. ? ?BMI: ? ?If you are age 56 or younger, your body mass index should be between 19-25. Your Body mass index is 27.07 kg/m?Marland Kitchen If this is out of the aformentioned range listed, please consider follow up with your Primary Care Provider.  ? ?MY CHART: ? ?The [facility] [place] providers would like to encourage you to use [facility] to communicate with providers for non-urgent requests or questions.  Due to long hold times on the telephone, sending your provider a message by [facility] may be a faster and more efficient way to get a response.  Please allow 48 business hours for a response.  Please remember that this is for non-urgent requests.  ? ?Thank you for trusting me with your gastrointestinal care!   ? ?Thornton Park, MD, MPH ? ?

## 2021-09-09 ENCOUNTER — Other Ambulatory Visit: Payer: Self-pay

## 2021-09-09 ENCOUNTER — Ambulatory Visit: Payer: BC Managed Care – PPO | Admitting: Physical Therapy

## 2021-09-09 DIAGNOSIS — M6281 Muscle weakness (generalized): Secondary | ICD-10-CM | POA: Diagnosis not present

## 2021-09-09 DIAGNOSIS — R293 Abnormal posture: Secondary | ICD-10-CM | POA: Diagnosis not present

## 2021-09-09 DIAGNOSIS — R279 Unspecified lack of coordination: Secondary | ICD-10-CM | POA: Diagnosis not present

## 2021-09-09 NOTE — Therapy (Addendum)
[facility] @ [place], [place], [address] Phone: [phone]   Fax:  [phone]  Physical Therapy Treatment  Patient Details  Name: Traci Schmidt MRN: [id number] Date of Birth: [birth year] Referring Provider (PT): Thornton Park, MD   Encounter Date: 09/09/2021   PT End of Session - 09/09/21 0930     Visit Number 4    Date for PT Re-Evaluation 10/30/21    Authorization Type BCBS    PT Start Time 0930    PT Stop Time 1000   pt denied additional treatment and ended appt early   PT Time Calculation (min) 30 min    Activity Tolerance Patient tolerated treatment well    Behavior During Therapy [facility] for tasks assessed/performed             Past Medical History:  Diagnosis Date   Abnormal uterine bleeding    Allergy    seasonal   Fibroid    Gallstones    Thyroid disease     Past Surgical History:  Procedure Laterality Date   ANAL RECTAL MANOMETRY N/A 07/20/2021   Procedure: ANO RECTAL MANOMETRY;  Surgeon: Thornton Park, MD;  Location: [facility];  Service: Gastroenterology;  Laterality: N/A;   BALLOON DILATION N/A 11/12/2013   Procedure: BALLOON DILATION WITH STONE EXTRACTION;  Surgeon: Daneil Dolin, MD;  Location: [facility];  Service: Endoscopy;  Laterality: N/A;   BREAST REDUCTION SURGERY  05/2020   CESAREAN SECTION     CESAREAN SECTION N/A    Phreesia 01/13/2020   cesarean section  2018   CHOLECYSTECTOMY N/A 11/05/2013   Dr. Arnoldo Morale   ERCP N/A 11/12/2013   Procedure: ENDOSCOPIC RETROGRADE CHOLANGIOPANCREATOGRAPHY (ERCP), SPHINCTEROTOMY, BALLOON DILATION WITH STONE EXTRACTION;  Surgeon: Daneil Dolin, MD;  Location: [facility];  Service: Endoscopy;  Laterality: N/A;   FRACTURE SURGERY N/A    Phreesia 01/13/2020   HIP SURGERY Left 07/2020   Done at [facility] N/A 11/12/2013   Procedure: Joan Mayans;  Surgeon: Daneil Dolin, MD;   Location: [facility];  Service: Endoscopy;  Laterality: N/A;    There were no vitals filed for this visit.   Subjective Assessment - 09/09/21 0930     Subjective Pt reports no longer taking medications to have BMs, 1 BMs every other day no straining. Pt reports abdominal massage and squatty potty have been very helpful and have increased water intake which worked well. Pt reports she now only has leakage of urine with not getting to the bathroom quickly enough and it is a small amount    Currently in Pain? No/denies                               [facility] Adult PT Treatment/Exercise - 09/09/21 0001       Self-Care   Self-Care Other Self-Care Comments    Other Self-Care Comments  Pt educated on continuing urge drill to give more time to reach bathroom without leakage and for increasing time between voids as needed, bladder diary given and educated on.                     PT Education - 09/09/21 0957     Education Details Pt educated on bladder diary and continued Urge drill, HEP given and reviewed.    Person(s) Educated Patient    Methods Explanation;Demonstration;Tactile  cues;Verbal cues;Handout    Comprehension Returned demonstration;Verbalized understanding              PT Short Term Goals - 09/09/21 1001       PT SHORT TERM GOAL #1   Title pt to be I with HEP    Time 4    Period Weeks    Status Achieved    Target Date 08/30/21      PT SHORT TERM GOAL #2   Title pt to recall good voiding mechanics without cues 100% of the time.    Time 4    Period Weeks    Status Achieved    Target Date 08/30/21      PT SHORT TERM GOAL #3   Title pt to demonstrate at least 3/5 pelvic floor strength with ability to hold contraction for 8s and ability to relax/bulge after contraction for improved pelvic floor mobility    Time 4    Period Weeks    Status On-going    Target Date 08/30/21               PT Long Term Goals - 09/09/21 1001       PT  LONG TERM GOAL #1   Title pt to be I with advanced HEP    Time 3    Period Months    Status On-going    Target Date 10/30/21      PT LONG TERM GOAL #2   Title pt to demonstrate at least 4/5 pelvic floor strength with ability to hold contraction for 15s and ability to relax/bulge after contraction for improved pelvic floor mobility    Time 3    Period Months    Status On-going    Target Date 10/30/21      PT LONG TERM GOAL #3   Title Pt to report improved bowel regularity to at least 3 BMs per week without straining and types 3-4 for improved bowel health.    Time 3    Period Months    Status Achieved    Target Date 10/30/21      PT LONG TERM GOAL #4   Title pt to report no more than 1 urinary leakage instance per month for decrease symptoms and ability to hold urine at least 3 hours without leakage for improved QOL.    Time 3    Period Months    Status On-going    Target Date 10/30/21                   Plan - 09/09/21 0957     Clinical Impression Statement Pt to return in 3-4 weeks for appointment to give more time to implement urge drill, irritants, and bladder retraining to decrease leakage. Pt reports she would like this to see if this improves between appointments and if it does following appointment for DC, if not additional appointments wil be scheduled. Pt session focused on education of bladder diary, urge drill, HEP, and proper technique for pelvic contraction and relaxation. Pt denied additional treatment after education as her knee has been bothering her and she did not want to attempt exercises. PT educated pt on alternatives for positions to relieve knee pain or limit stress at knees or importance of hip strengthening and pt requested a hip exercises to attempt at home. PT added and reivewed this with pt and she returned demonstration with good technique. Pt would benefit from additional PT to further address deficits.    Personal  Factors and Comorbidities Time  since onset of injury/illness/exacerbation;Comorbidity 1    Comorbidities x3 c-sections, urinary leakage and urgency and conspitation    Examination-Activity Limitations Continence;Lift;Other   intercourse, voiding   Examination-Participation Restrictions Community Activity;Interpersonal Relationship    Stability/Clinical Decision Making Stable/Uncomplicated    Rehab Potential Good    PT Frequency 1x / week    PT Duration Other (comment)   10 visits   PT Treatment/Interventions ADLs/Self Care Home Management;Functional mobility training;Therapeutic activities;Therapeutic exercise;Neuromuscular re-education;Manual techniques;Cryotherapy;Moist Heat;Taping;Patient/family education;Passive range of motion;Energy conservation;Dry needling    PT Next Visit Plan review gains/progress at end visit and assess for further need for PT    Consulted and Agree with Plan of Care Patient             Patient will benefit from skilled therapeutic intervention in order to improve the following deficits and impairments:  Decreased coordination, Decreased endurance, Increased fascial restricitons, Impaired tone, Impaired flexibility, Improper body mechanics, Postural dysfunction, Impaired sensation, Decreased strength, Decreased mobility  Visit Diagnosis: Muscle weakness (generalized)  Unspecified lack of coordination     Problem List Patient Active Problem List   Diagnosis Date Noted   Acute left-sided low back pain with sciatica 03/24/2021   Intervertebral disc disorder with radiculopathy of lumbar region 03/24/2021   Constipation 03/11/2021   Abnormal TSH 03/11/2021   Iliopsoas bursitis of left hip 12/04/2020   Breast pain, left 11/30/2020   SI (sacroiliac) pain 11/20/2020   S/P bilateral breast reduction 06/21/2020   Other bilateral secondary osteoarthritis of knee 06/03/2020   Acetabular labrum tear, left, subsequent encounter 05/02/2020   Acquired leg length discrepancy 04/17/2020   Neck  pain 04/13/2020   Symptomatic mammary hypertrophy 04/13/2020   Female genital prolapse 09/04/2018   DUB (dysfunctional uterine bleeding) 07/23/2018   Fusion of spine of lumbar region 07/18/2018   Pelvic deformity 04/24/2018   Lumbosacral radiculopathy at L5 03/15/2018   Vitamin D deficiency 01/01/2018   Obesity (BMI 30-39.9) 06/23/2017   Stacy Gardner, PT, DPT 09/09/2308:03 AM    PHYSICAL THERAPY DISCHARGE SUMMARY  Visits from Start of Care: 4  Current functional level related to goals / functional outcomes: Unable to formally reassess as pt has not returned since last visit   Remaining deficits: Unknown as pt has not returned    Education / Equipment: HEP   Patient agrees to discharge. Patient goals were partially met. Patient is being discharged due to not returning since the last visit.   Stacy Gardner, PT, DPT 11/08/2309:38 AM   [facility] @ [place], [place], [address] Phone: [phone]   Fax:  [phone]  Name: Jonnae Fonseca MRN: [id number] Date of Birth: August 22, 1980

## 2021-09-16 ENCOUNTER — Encounter: Payer: BC Managed Care – PPO | Admitting: Physical Therapy

## 2021-09-23 ENCOUNTER — Encounter: Payer: BC Managed Care – PPO | Admitting: Physical Therapy

## 2021-09-30 ENCOUNTER — Ambulatory Visit: Payer: BC Managed Care – PPO | Attending: Gastroenterology | Admitting: Physical Therapy

## 2021-09-30 DIAGNOSIS — R279 Unspecified lack of coordination: Secondary | ICD-10-CM | POA: Insufficient documentation

## 2021-09-30 DIAGNOSIS — M6281 Muscle weakness (generalized): Secondary | ICD-10-CM | POA: Insufficient documentation

## 2021-09-30 DIAGNOSIS — R293 Abnormal posture: Secondary | ICD-10-CM | POA: Insufficient documentation

## 2021-10-13 ENCOUNTER — Encounter: Payer: Self-pay | Admitting: Internal Medicine

## 2021-10-13 ENCOUNTER — Ambulatory Visit (INDEPENDENT_AMBULATORY_CARE_PROVIDER_SITE_OTHER): Payer: BC Managed Care – PPO | Admitting: Internal Medicine

## 2021-10-13 VITALS — BP 140/88 | HR 63 | Resp 18 | Ht 62.0 in | Wt 149.6 lb

## 2021-10-13 DIAGNOSIS — K5900 Constipation, unspecified: Secondary | ICD-10-CM | POA: Diagnosis not present

## 2021-10-13 DIAGNOSIS — Z0000 Encounter for general adult medical examination without abnormal findings: Secondary | ICD-10-CM

## 2021-10-13 DIAGNOSIS — Z981 Arthrodesis status: Secondary | ICD-10-CM | POA: Insufficient documentation

## 2021-10-13 DIAGNOSIS — M955 Acquired deformity of pelvis: Secondary | ICD-10-CM

## 2021-10-13 NOTE — Assessment & Plan Note (Addendum)
From L3-S1 for lumbar radiculopathy Followed by orthopedic surgery at [facility] Gets SI joint injections Mobic as needed for pain 
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
## 2021-10-13 NOTE — Assessment & Plan Note (Signed)
Chronic, followed by GI ?Has annual sphincter incompetence ?Has been PT ?Continue OTC stool softener ?Did not tolerate Linzess and Trulance in the past ?

## 2021-10-13 NOTE — Patient Instructions (Addendum)
Please continue to take stool softener as needed for constipation. ? ?Please continue to maintain adequate hydration by taking at least 64 ounces per day. ? ?Please continue to perform moderate exercise/walking at least 150 mins/week. ?
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
## 2021-12-21 ENCOUNTER — Ambulatory Visit: Payer: BC Managed Care – PPO | Admitting: Internal Medicine

## 2022-01-24 ENCOUNTER — Ambulatory Visit: Payer: BC Managed Care – PPO | Admitting: Internal Medicine

## 2022-01-24 DIAGNOSIS — Z0000 Encounter for general adult medical examination without abnormal findings: Secondary | ICD-10-CM | POA: Diagnosis not present

## 2022-01-25 LAB — TSH: TSH: 1.75 u[IU]/mL (ref 0.450–4.500)

## 2022-01-25 LAB — CBC WITH DIFFERENTIAL/PLATELET
Basophils Absolute: 0 10*3/uL (ref 0.0–0.2)
Basos: 0 %
EOS (ABSOLUTE): 0.1 10*3/uL (ref 0.0–0.4)
Eos: 1 %
Hematocrit: 40.5 % (ref 34.0–46.6)
Hemoglobin: 13.7 g/dL (ref 11.1–15.9)
Immature Grans (Abs): 0 10*3/uL (ref 0.0–0.1)
Immature Granulocytes: 0 %
Lymphocytes Absolute: 3.4 10*3/uL — ABNORMAL HIGH (ref 0.7–3.1)
Lymphs: 40 %
MCH: 29.3 pg (ref 26.6–33.0)
MCHC: 33.8 g/dL (ref 31.5–35.7)
MCV: 87 fL (ref 79–97)
Monocytes Absolute: 0.5 10*3/uL (ref 0.1–0.9)
Monocytes: 6 %
Neutrophils Absolute: 4.4 10*3/uL (ref 1.4–7.0)
Neutrophils: 53 %
Platelets: 337 10*3/uL (ref 150–450)
RBC: 4.67 x10E6/uL (ref 3.77–5.28)
RDW: 11.6 % — ABNORMAL LOW (ref 11.7–15.4)
WBC: 8.4 10*3/uL (ref 3.4–10.8)

## 2022-01-25 LAB — LIPID PANEL
Chol/HDL Ratio: 2.6 ratio (ref 0.0–4.4)
Cholesterol, Total: 171 mg/dL (ref 100–199)
HDL: 67 mg/dL (ref >39)
LDL Chol Calc (NIH): 88 mg/dL (ref 0–99)
Triglycerides: 84 mg/dL (ref 0–149)
VLDL Cholesterol Cal: 16 mg/dL (ref 5–40)

## 2022-01-25 LAB — CMP14+EGFR
ALT: 25 IU/L (ref 0–32)
AST: 27 IU/L (ref 0–40)
Albumin/Globulin Ratio: 1.4 (ref 1.2–2.2)
Albumin: 4.3 g/dL (ref 3.9–4.9)
Alkaline Phosphatase: 66 IU/L (ref 44–121)
BUN/Creatinine Ratio: 12 (ref 9–23)
BUN: 9 mg/dL (ref 6–24)
Bilirubin Total: 1 mg/dL (ref 0.0–1.2)
CO2: 20 mmol/L (ref 20–29)
Calcium: 9.7 mg/dL (ref 8.7–10.2)
Chloride: 101 mmol/L (ref 96–106)
Creatinine, Ser: 0.73 mg/dL (ref 0.57–1.00)
Globulin, Total: 3.1 g/dL (ref 1.5–4.5)
Glucose: 91 mg/dL (ref 70–99)
Potassium: 4.8 mmol/L (ref 3.5–5.2)
Sodium: 137 mmol/L (ref 134–144)
Total Protein: 7.4 g/dL (ref 6.0–8.5)
eGFR: 107 mL/min/{1.73_m2} (ref >59)

## 2022-01-25 LAB — HEMOGLOBIN A1C
Est. average glucose Bld gHb Est-mCnc: 108 mg/dL
Hgb A1c MFr Bld: 5.4 % (ref 4.8–5.6)

## 2022-01-25 LAB — VITAMIN D 25 HYDROXY (VIT D DEFICIENCY, FRACTURES): Vit D, 25-Hydroxy: 19.2 ng/mL — ABNORMAL LOW (ref 30.0–100.0)

## 2022-01-31 ENCOUNTER — Ambulatory Visit (INDEPENDENT_AMBULATORY_CARE_PROVIDER_SITE_OTHER): Payer: BC Managed Care – PPO | Admitting: Internal Medicine

## 2022-01-31 ENCOUNTER — Encounter: Payer: Self-pay | Admitting: Internal Medicine

## 2022-01-31 VITALS — BP 104/72 | HR 83 | Resp 18 | Ht 62.0 in | Wt 158.8 lb

## 2022-01-31 DIAGNOSIS — E559 Vitamin D deficiency, unspecified: Secondary | ICD-10-CM

## 2022-01-31 DIAGNOSIS — L219 Seborrheic dermatitis, unspecified: Secondary | ICD-10-CM

## 2022-01-31 DIAGNOSIS — Z981 Arthrodesis status: Secondary | ICD-10-CM | POA: Diagnosis not present

## 2022-01-31 MED ORDER — KETOCONAZOLE 2 % EX SHAM: 1.0000 | MEDICATED_SHAMPOO | CUTANEOUS | 0 refills | Status: DC

## 2022-01-31 NOTE — Assessment & Plan Note (Signed)
From L3-S1 for lumbar radiculopathy Followed by orthopedic surgery at [facility] Gets SI joint injections Mobic as needed for pain 
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
## 2022-01-31 NOTE — Assessment & Plan Note (Signed)
Advised to take Vitamin D 5000 IU QD 

## 2022-01-31 NOTE — Patient Instructions (Addendum)
Please take Vitamin D 5000 IU once daily.  Please apply Ketoconazole shampoo for hair loss.  Please continue taking other medications as prescribed.  Please continue to follow heart healthy diet and perform moderate exercise/walking at least 150 mins/week.

## 2022-01-31 NOTE — Assessment & Plan Note (Signed)
Patchy hair loss could be due to seborrheic dermatitis Ketoconazole shampoo prescribed Referred to dermatology Her advised to take hair, skin and nail multivitamin 

## 2022-02-13 NOTE — Progress Notes (Signed)
41 y.o. [id number] Married Asian Not Hispanic or Latino female here for annual exam.   She has a mirena IUD, placed in 4/20.  Prior to IUD insertion she had a negative evaluation for AUB other than suspected adenomyosis. The IUD was placed for management of her AUB and contraception.  Prior to 1/22 she was spotting for a couple of days every 2-3 months.  She spotted the month of April, 2022 and was given a course of OCP's to try and stop her bleeding. She stopped the pill secondary to nausea, but her bleeding stopped.  She was seen for recurrent AUB in 11/22 and an ultrasound was done. The IUD was in place and her endometrium was 3.14 mm. Normal ultrasound.  Since 11/22 she has had irregular bleeding. Would bleed for 10 days then stop for 10 days. She has not having any heavy bleeding, but is have spotting most days. Tolerable.   Sexually active, no pain.      She has had a complicated history with multiple surgeries to try and repair a fractured pelvis.  The pelvic bone surgery is done (still missing a piece of pelvic bone in the front, but stable).   She is going to need a left hip replacement. Getting by with steroid injections for now.   H/O C/S x 3. H/O asymptomatic grade 2 cystocele and rectocele and grade 1 uterine prolapse.   She has some urge incontinence, leaks small amounts daily. Started 2-3 months ago. Voids frequently, but normal amounts. No dysuria.   Previously did pelvic floor PT for constipation and it made her hip pain worse.  She has a BM 1 x a day, sometimes needs to strain.   No LMP recorded. (Menstrual status: IUD).          Sexually active: Yes.    The current method of family planning is IUD.    Exercising: No.  The patient does not participate in regular exercise at present. Smoker:  no  Health Maintenance: Pap:  01/31/18 WNL Hr HPV Neg  History of abnormal Pap:  no MMG:  07/29/21 density B Bi-rads 2 benign  BMD:   none  Colonoscopy: none  TDaP:   02/17/20 Gardasil: none, has declined in the past    reports that she has never smoked. She has never used smokeless tobacco. She reports that she does not drink alcohol and does not use drugs. Homemaker. Kids are 14, 12 and 5. Older 2 are girls, youngest is a boy.   Past Medical History:  Diagnosis Date   Abnormal uterine bleeding    Allergy    seasonal   Fibroid    Gallstones    Thyroid disease     Past Surgical History:  Procedure Laterality Date   ANAL RECTAL MANOMETRY N/A 07/20/2021   Procedure: ANO RECTAL MANOMETRY;  Surgeon: Thornton Park, MD;  [facility]: [facility];  Service: Gastroenterology;  Laterality: N/A;   BALLOON DILATION N/A 11/12/2013   Procedure: BALLOON DILATION WITH STONE EXTRACTION;  Surgeon: Daneil Dolin, MD;  Location: [facility];  Service: Endoscopy;  Laterality: N/A;   BREAST REDUCTION SURGERY  05/2020   CESAREAN SECTION     CESAREAN SECTION N/A    Phreesia 01/13/2020   cesarean section  2018   CHOLECYSTECTOMY N/A 11/05/2013   Dr. Arnoldo Morale   ERCP N/A 11/12/2013   Procedure: ENDOSCOPIC RETROGRADE CHOLANGIOPANCREATOGRAPHY (ERCP), SPHINCTEROTOMY, BALLOON DILATION WITH STONE EXTRACTION;  Surgeon: Daneil Dolin, MD;  Location: [facility];  Service: Endoscopy;  Laterality: N/A;   FRACTURE SURGERY N/A    Phreesia 01/13/2020   HIP SURGERY Left 07/2020   Done at [place] N/A 11/12/2013   Procedure: Joan Mayans;  Surgeon: Daneil Dolin, MD;  Location: [facility];  Service: Endoscopy;  Laterality: N/A;    Current Outpatient Medications  Medication Sig Dispense Refill   acetaminophen (TYLENOL) 500 MG tablet Take 500 mg by mouth every 6 (six) hours as needed.     ketoconazole (NIZORAL) 2 % shampoo Apply 1 Application topically 2 (two) times a week. 120 mL 0   levonorgestrel (MIRENA, 52 MG,) 20 MCG/24HR IUD 1 each by Intrauterine route once.     meloxicam (MOBIC) 15 MG tablet Take 15 mg by mouth daily as needed.      triamcinolone cream (KENALOG) 0.1 % Apply 1 application topically daily as needed. 453 g 2   No current facility-administered medications for this visit.    Family History  Problem Relation Age of Onset   Hypertension Mother    Breast cancer Mother 57   Hyperlipidemia Father    Diabetes Father    Diabetes Brother    Hyperlipidemia Brother    Hyperlipidemia Brother    Colon cancer Neg Hx     Review of Systems  All other systems reviewed and are negative.   Exam:   BP 134/72   Pulse 66   Ht '5\' 2"'$  (1.575 m)   Wt 160 lb (72.6 kg)   SpO2 100%   BMI 29.26 kg/m   Weight change: '@WEIGHTCHANGE'$ @ Height:   Height: '5\' 2"'$  (157.5 cm)  Ht Readings from Last 3 Encounters:  02/23/22 '5\' 2"'$  (1.575 m)  01/31/22 '5\' 2"'$  (1.575 m)  10/13/21 '5\' 2"'$  (1.575 m)    General appearance: alert, cooperative and appears stated age Head: Normocephalic, without obvious abnormality, atraumatic Neck: no adenopathy, supple, symmetrical, trachea midline and thyroid normal to inspection and palpation Lungs: clear to auscultation bilaterally Cardiovascular: regular rate and rhythm Breasts: normal appearance, no masses or tenderness Abdomen: soft, non-tender; non distended,  no masses,  no organomegaly Extremities: extremities normal, atraumatic, no cyanosis or edema Skin: Skin color, texture, turgor normal. No rashes or lesions Lymph nodes: Cervical, supraclavicular, and axillary nodes normal. No abnormal inguinal nodes palpated Neurologic: Grossly normal   Pelvic: External genitalia:  no lesions              Urethra:  normal appearing urethra with no masses, tenderness or lesions              Bartholins and Skenes: normal                 Vagina: normal appearing vagina with normal color and discharge, no lesions. Grade 2 cystocele and rectocele, grade 1-2 uterine prolapse.               Cervix: no lesions and IUD string missing, unable to tease out of the cervix with the cytobrush               Bimanual  Exam:  Uterus:  normal size, contour, position, consistency, mobility, non-tender and retroverted              Adnexa: no mass, fullness, tenderness               Rectovaginal: Confirms               Anus:  normal sphincter tone, no lesions  Gae Dry chaperoned for the exam.  1. Well woman exam Discussed breast self exam Discussed calcium and vit D intake Pap next year Mammogram is UTD She has stable genital prolapse, not bothersome  2. IUD check up Spotting is tolerable. String is missing, was 1 cm last year.   3. Abnormal uterine bleeding Spotting with the IUD, tolerable. Prior normal ultrasound, IUD in place, thin stripe.  - US PELVIS TRANSVAGINAL NON-OB (TV ONLY); Future  4. Urge incontinence Discussed options of PT and medication. She will reach out if she is interested - Urinalysis, Complete - Urine Culture  5. Intrauterine contraceptive device threads lost, initial encounter - US PELVIS TRANSVAGINAL NON-OB (TV ONLY); Future

## 2022-02-23 ENCOUNTER — Ambulatory Visit (INDEPENDENT_AMBULATORY_CARE_PROVIDER_SITE_OTHER): Payer: BC Managed Care – PPO | Admitting: Obstetrics and Gynecology

## 2022-02-23 ENCOUNTER — Encounter: Payer: Self-pay | Admitting: Obstetrics and Gynecology

## 2022-02-23 VITALS — BP 134/72 | HR 66 | Ht 62.0 in | Wt 160.0 lb

## 2022-02-23 DIAGNOSIS — T8332XA Displacement of intrauterine contraceptive device, initial encounter: Secondary | ICD-10-CM

## 2022-02-23 DIAGNOSIS — N939 Abnormal uterine and vaginal bleeding, unspecified: Secondary | ICD-10-CM | POA: Diagnosis not present

## 2022-02-23 DIAGNOSIS — Z01419 Encounter for gynecological examination (general) (routine) without abnormal findings: Secondary | ICD-10-CM

## 2022-02-23 DIAGNOSIS — Z30431 Encounter for routine checking of intrauterine contraceptive device: Secondary | ICD-10-CM

## 2022-02-23 DIAGNOSIS — N3941 Urge incontinence: Secondary | ICD-10-CM | POA: Diagnosis not present

## 2022-02-23 LAB — URINALYSIS, COMPLETE
Bacteria, UA: NONE SEEN /HPF
Bilirubin Urine: NEGATIVE
Casts: NONE SEEN /LPF
Crystals: NONE SEEN /HPF
Glucose, UA: NEGATIVE
Hyaline Cast: NONE SEEN /LPF
Ketones, ur: NEGATIVE
Leukocytes,Ua: NEGATIVE
Nitrite: NEGATIVE
Protein, ur: NEGATIVE
RBC / HPF: NONE SEEN /HPF (ref 0–2)
Specific Gravity, Urine: 1.01 (ref 1.001–1.035)
WBC, UA: NONE SEEN /HPF (ref 0–5)
Yeast: NONE SEEN /HPF
pH: 5.5 (ref 5.0–8.0)

## 2022-02-23 NOTE — Patient Instructions (Signed)
EXERCISE   We recommended that you start or continue a regular exercise program for good health. Physical activity is anything that gets your body moving, some is better than none. The CDC recommends 150 minutes per week of Moderate-Intensity Aerobic Activity and 2 or more days of Muscle Strengthening Activity.  Benefits of exercise are limitless: helps weight loss/weight maintenance, improves mood and energy, helps with depression and anxiety, improves sleep, tones and strengthens muscles, improves balance, improves bone density, protects from chronic conditions such as heart disease, high blood pressure and diabetes and so much more. To learn more visit: https://www.cdc.gov/physicalactivity/index.html  DIET: Good nutrition starts with a healthy diet of fruits, vegetables, whole grains, and lean protein sources. Drink plenty of water for hydration. Minimize empty calories, sodium, sweets. For more information about dietary recommendations visit: https://health.gov/our-work/nutrition-physical-activity/dietary-guidelines and https://www.myplate.gov/  ALCOHOL:  Women should limit their alcohol intake to no more than 7 drinks/beers/glasses of wine (combined, not each!) per week. Moderation of alcohol intake to this level decreases your risk of breast cancer and liver damage.  If you are concerned that you may have a problem, or your friends have told you they are concerned about your drinking, there are many resources to help. A well-known program that is free, effective, and available to all people all over the nation is Alcoholics Anonymous.  Check out this site to learn more: https://www.aa.org/   CALCIUM AND VITAMIN D:  Adequate intake of calcium and Vitamin D are recommended for bone health.  You should be getting between 1000-1200 mg of calcium and 800 units of Vitamin D daily between diet and supplements  PAP SMEARS:  Pap smears, to check for cervical cancer or precancers,  have traditionally been  done yearly, scientific advances have shown that most women can have pap smears less often.  However, every woman still should have a physical exam from her gynecologist every year. It will include a breast check, inspection of the vulva and vagina to check for abnormal growths or skin changes, a visual exam of the cervix, and then an exam to evaluate the size and shape of the uterus and ovaries. We will also provide age appropriate advice regarding health maintenance, like when you should have certain vaccines, screening for sexually transmitted diseases, bone density testing, colonoscopy, mammograms, etc.   MAMMOGRAMS:  All women over 40 years old should have a routine mammogram.   COLON CANCER SCREENING: Now recommend starting at age 45. At this time colonoscopy is not covered for routine screening until 50. There are take home tests that can be done between 45-49.   COLONOSCOPY:  Colonoscopy to screen for colon cancer is recommended for all women at age 50.  We know, you hate the idea of the prep.  We agree, BUT, having colon cancer and not knowing it is worse!!  Colon cancer so often starts as a polyp that can be seen and removed at colonscopy, which can quite literally save your life!  And if your first colonoscopy is normal and you have no family history of colon cancer, most women don't have to have it again for 10 years.  Once every ten years, you can do something that may end up saving your life, right?  We will be happy to help you get it scheduled when you are ready.  Be sure to check your insurance coverage so you understand how much it will cost.  It may be covered as a preventative service at no cost, but you should check   your particular policy.      Breast Self-Awareness Breast self-awareness means being familiar with how your breasts look and feel. It involves checking your breasts regularly and reporting any changes to your health care provider. Practicing breast self-awareness is  important. A change in your breasts can be a sign of a serious medical problem. Being familiar with how your breasts look and feel allows you to find any problems early, when treatment is more likely to be successful. All women should practice breast self-awareness, including women who have had breast implants. How to do a breast self-exam One way to learn what is normal for your breasts and whether your breasts are changing is to do a breast self-exam. To do a breast self-exam: Look for Changes  Remove all the clothing above your waist. Stand in front of a mirror in a room with good lighting. Put your hands on your hips. Push your hands firmly downward. Compare your breasts in the mirror. Look for differences between them (asymmetry), such as: Differences in shape. Differences in size. Puckers, dips, and bumps in one breast and not the other. Look at each breast for changes in your skin, such as: Redness. Scaly areas. Look for changes in your nipples, such as: Discharge. Bleeding. Dimpling. Redness. A change in position. Feel for Changes Carefully feel your breasts for lumps and changes. It is best to do this while lying on your back on the floor and again while sitting or standing in the shower or tub with soapy water on your skin. Feel each breast in the following way: Place the arm on the side of the breast you are examining above your head. Feel your breast with the other hand. Start in the nipple area and make  inch (2 cm) overlapping circles to feel your breast. Use the pads of your three middle fingers to do this. Apply light pressure, then medium pressure, then firm pressure. The light pressure will allow you to feel the tissue closest to the skin. The medium pressure will allow you to feel the tissue that is a little deeper. The firm pressure will allow you to feel the tissue close to the ribs. Continue the overlapping circles, moving downward over the breast until you feel your  ribs below your breast. Move one finger-width toward the center of the body. Continue to use the  inch (2 cm) overlapping circles to feel your breast as you move slowly up toward your collarbone. Continue the up and down exam using all three pressures until you reach your armpit.  Write Down What You Find  Write down what is normal for each breast and any changes that you find. Keep a written record with breast changes or normal findings for each breast. By writing this information down, you do not need to depend only on memory for size, tenderness, or location. Write down where you are in your menstrual cycle, if you are still menstruating. If you are having trouble noticing differences in your breasts, do not get discouraged. With time you will become more familiar with the variations in your breasts and more comfortable with the exam. How often should I examine my breasts? Examine your breasts every month. If you are breastfeeding, the best time to examine your breasts is after a feeding or after using a breast pump. If you menstruate, the best time to examine your breasts is 5-7 days after your period is over. During your period, your breasts are lumpier, and it may be more   difficult to notice changes. When should I see my health care provider? See your health care provider if you notice: A change in shape or size of your breasts or nipples. A change in the skin of your breast or nipples, such as a reddened or scaly area. Unusual discharge from your nipples. A lump or thick area that was not there before. Pain in your breasts. Anything that concerns you. Overactive Bladder, Adult  Overactive bladder is a condition in which a person has a sudden and frequent need to urinate. A person might also leak urine if he or she cannot get to the bathroom fast enough (urinary incontinence). Sometimes, symptoms can interfere with work or social activities. What are the causes? Overactive bladder is  associated with poor nerve signals between your bladder and your brain. Your bladder may get the signal to empty before it is full. You may also have very sensitive muscles that make your bladder squeeze too soon. This condition may also be caused by other factors, such as: Medical conditions: Urinary tract infection. Infection of nearby tissues. Prostate enlargement. Bladder stones, inflammation, or tumors. Diabetes. Muscle or nerve weakness, especially from these conditions: A spinal cord injury. Stroke. Multiple sclerosis. Parkinson's disease. Other causes: Surgery on the uterus or urethra. Drinking too much caffeine or alcohol. Certain medicines, especially those that eliminate extra fluid in the body (diuretics). Constipation. What increases the risk? You may be at greater risk for overactive bladder if you: Are an older adult. Smoke. Are going through menopause. Have prostate problems. Have a neurological disease, such as stroke, dementia, Parkinson's disease, or multiple sclerosis (MS). Eat or drink alcohol, spicy food, caffeine, and other things that irritate the bladder. Are overweight or obese. What are the signs or symptoms? Symptoms of this condition include a sudden, strong urge to urinate. Other symptoms include: Leaking urine. Urinating 8 or more times a day. Waking up to urinate 2 or more times overnight. How is this diagnosed? This condition may be diagnosed based on: Your symptoms and medical history. A physical exam. Blood or urine tests to check for possible causes, such as infection. You may also need to see a health care provider who specializes in urinary tract problems. This is called a urologist. How is this treated? Treatment for overactive bladder depends on the cause of your condition and whether it is mild or severe. Treatment may include: Bladder training, such as: Learning to control the urge to urinate by following a schedule to urinate at  regular intervals. Doing Kegel exercises to strengthen the pelvic floor muscles that support your bladder. Special devices, such as: Biofeedback. This uses sensors to help you become aware of your body's signals. Electrical stimulation. This uses electrodes placed inside the body (implanted) or outside the body. These electrodes send gentle pulses of electricity to strengthen the nerves or muscles that control the bladder. Women may use a plastic device, called a pessary, that fits into the vagina and supports the bladder. Medicines, such as: Antibiotics to treat bladder infection. Antispasmodics to stop the bladder from releasing urine at the wrong time. Tricyclic antidepressants to relax bladder muscles. Injections of botulinum toxin type A directly into the bladder tissue to relax bladder muscles. Surgery, such as: A device may be implanted to help manage the nerve signals that control urination. An electrode may be implanted to stimulate electrical signals in the bladder. A procedure may be done to change the shape of the bladder. This is done only in very severe  cases. Follow these instructions at home: Eating and drinking  Make diet or lifestyle changes recommended by your health care provider. These may include: Drinking fluids throughout the day and not only with meals. Cutting down on caffeine or alcohol. Eating a healthy and balanced diet to prevent constipation. This may include: Choosing foods that are high in fiber, such as beans, whole grains, and fresh fruits and vegetables. Limiting foods that are high in fat and processed sugars, such as fried and sweet foods. Lifestyle  Lose weight if needed. Do not use any products that contain nicotine or tobacco. These include cigarettes, chewing tobacco, and vaping devices, such as e-cigarettes. If you need help quitting, ask your health care provider. General instructions Take over-the-counter and prescription medicines only as  told by your health care provider. If you were prescribed an antibiotic medicine, take it as told by your health care provider. Do not stop taking the antibiotic even if you start to feel better. Use any implants or pessary as told by your health care provider. If needed, wear pads to absorb urine leakage. Keep a log to track how much and when you drink, and when you need to urinate. This will help your health care provider monitor your condition. Keep all follow-up visits. This is important. Contact a health care provider if: You have a fever or chills. Your symptoms do not get better with treatment. Your pain and discomfort get worse. You have more frequent urges to urinate. Get help right away if: You are not able to control your bladder. Summary Overactive bladder refers to a condition in which a person has a sudden and frequent need to urinate. Several conditions may lead to an overactive bladder. Treatment for overactive bladder depends on the cause and severity of your condition. Making lifestyle changes, doing Kegel exercises, keeping a log, and taking medicines can help with this condition. This information is not intended to replace advice given to you by your health care provider. Make sure you discuss any questions you have with your health care provider. Document Revised: 02/23/2020 Document Reviewed: 02/23/2020 Elsevier Patient Education  [place].

## 2022-02-24 LAB — URINE CULTURE
MICRO NUMBER:: 13885554
SPECIMEN QUALITY:: ADEQUATE

## 2022-02-28 ENCOUNTER — Other Ambulatory Visit: Payer: Self-pay | Admitting: Obstetrics and Gynecology

## 2022-02-28 MED ORDER — AMOXICILLIN 500 MG PO CAPS: 500.0000 mg | ORAL_CAPSULE | Freq: Three times a day (TID) | ORAL | 0 refills | Status: DC

## 2022-03-02 ENCOUNTER — Ambulatory Visit (INDEPENDENT_AMBULATORY_CARE_PROVIDER_SITE_OTHER): Payer: BC Managed Care – PPO

## 2022-03-02 ENCOUNTER — Telehealth: Payer: Self-pay | Admitting: Obstetrics and Gynecology

## 2022-03-02 DIAGNOSIS — T8332XA Displacement of intrauterine contraceptive device, initial encounter: Secondary | ICD-10-CM | POA: Diagnosis not present

## 2022-03-02 DIAGNOSIS — N939 Abnormal uterine and vaginal bleeding, unspecified: Secondary | ICD-10-CM | POA: Diagnosis not present

## 2022-03-02 NOTE — Telephone Encounter (Signed)
Please let the patient know that her ultrasound is normal and her IUD is in the proper location.

## 2022-04-26 DIAGNOSIS — Z1389 Encounter for screening for other disorder: Secondary | ICD-10-CM | POA: Diagnosis not present

## 2022-04-26 DIAGNOSIS — Z981 Arthrodesis status: Secondary | ICD-10-CM | POA: Diagnosis not present

## 2022-04-26 DIAGNOSIS — R635 Abnormal weight gain: Secondary | ICD-10-CM | POA: Diagnosis not present

## 2022-04-26 DIAGNOSIS — E663 Overweight: Secondary | ICD-10-CM | POA: Diagnosis not present

## 2022-04-26 DIAGNOSIS — E8889 Other specified metabolic disorders: Secondary | ICD-10-CM | POA: Diagnosis not present

## 2022-04-26 DIAGNOSIS — E559 Vitamin D deficiency, unspecified: Secondary | ICD-10-CM | POA: Diagnosis not present

## 2022-07-13 IMAGING — US US BREAST BX W LOC DEV 1ST LESION IMG BX SPEC US GUIDE*L*
1 series · 12 of 21 positions shown · non-contrast
Comparison: Previous exam(s).
COMPARISON: Previous exam(s).

Addendum:
CLINICAL DATA: New 1.2 cm mass seen in the 5 o'clock position the
left breast at recent mammography and ultrasound. There were also 3
left axillary lymph nodes with mild cortical thickening at recent
ultrasound. The patient reports that she had a left arm CGAZZ-GJ
vaccine in October 2019. Family history of breast cancer with her mother
diagnosed at approximately age 60.

EXAM:
ULTRASOUND GUIDED LEFT BREAST CORE NEEDLE BIOPSY

[Series 1: us breast bx w loc dev 1st lesion img bx spec us g · 0.06mm/px · 12 of 21 slices shown]
[im 1/21]
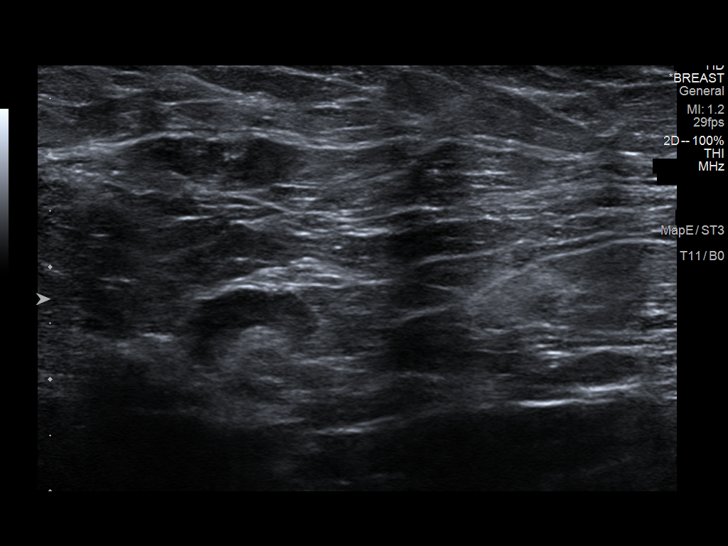
[im 3/21]
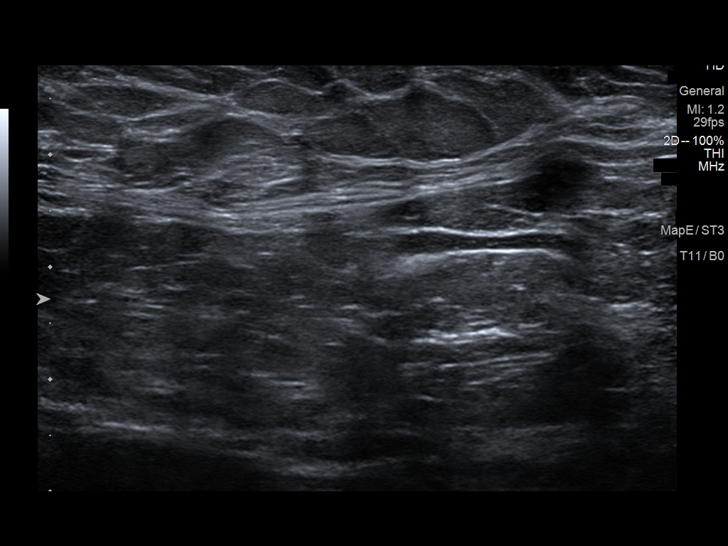
[im 5/21]
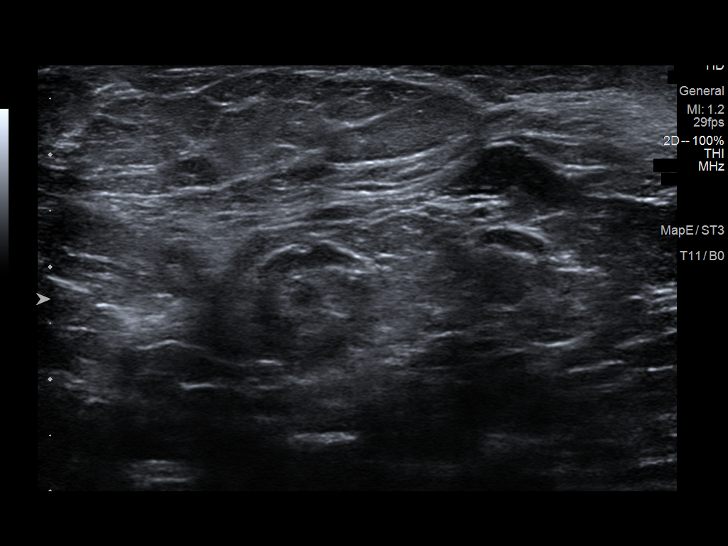
[im 7/21]
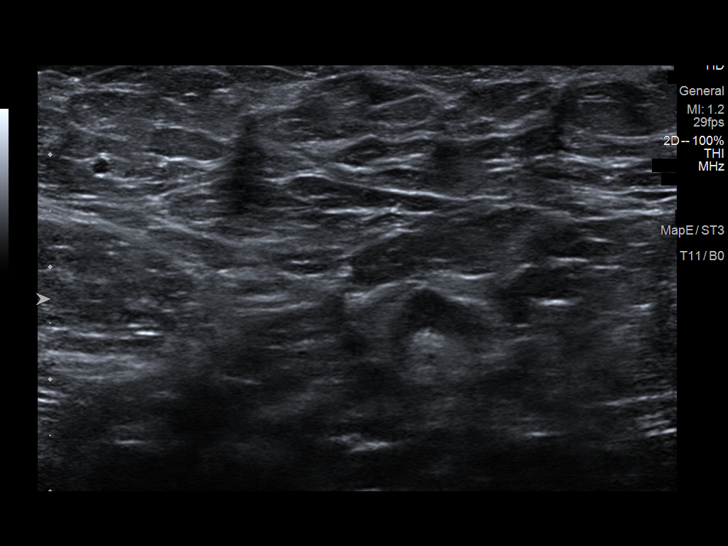
[im 8/21]
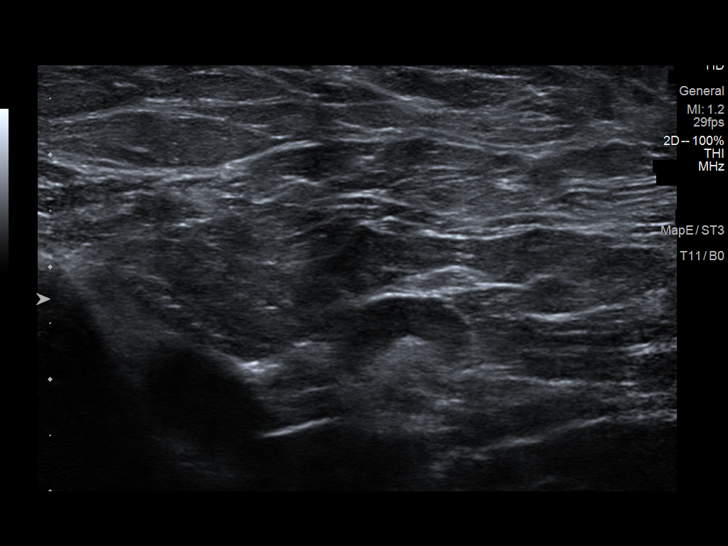
[im 10/21]
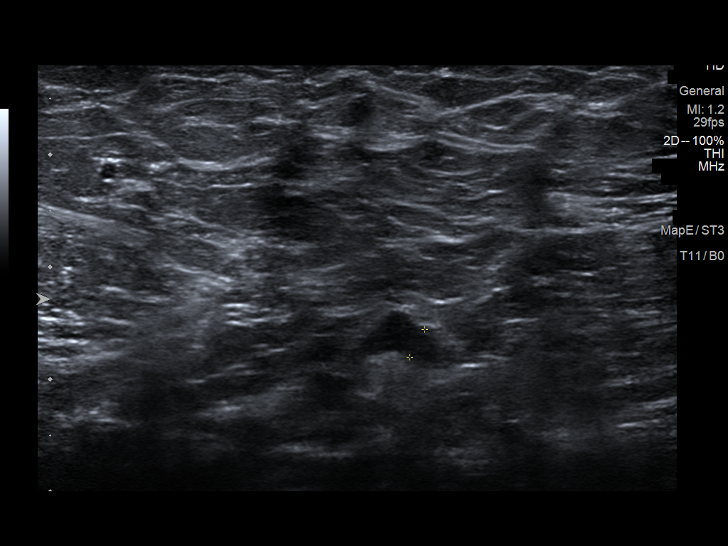
[im 12/21]
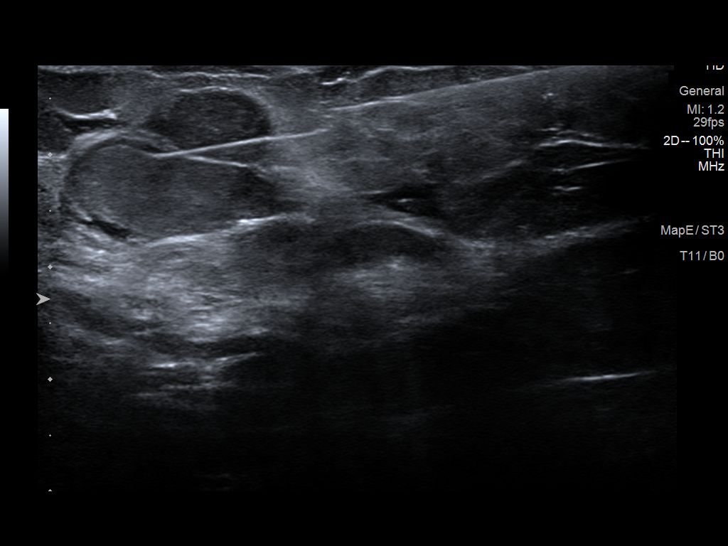
[im 14/21]
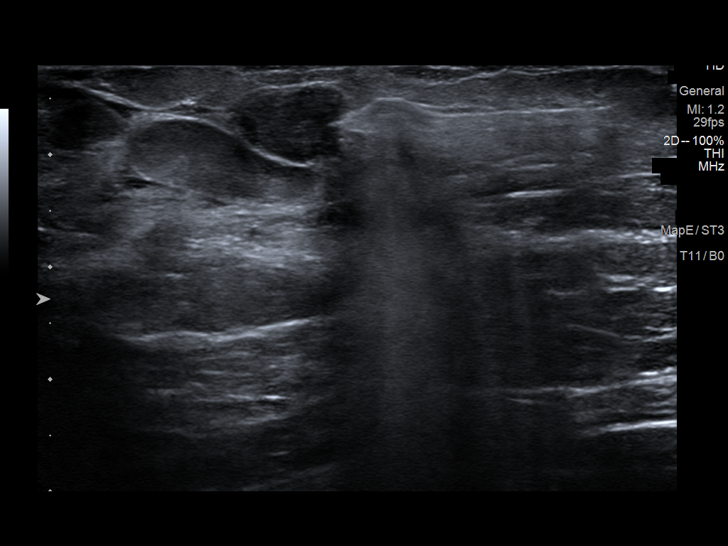
[im 15/21]
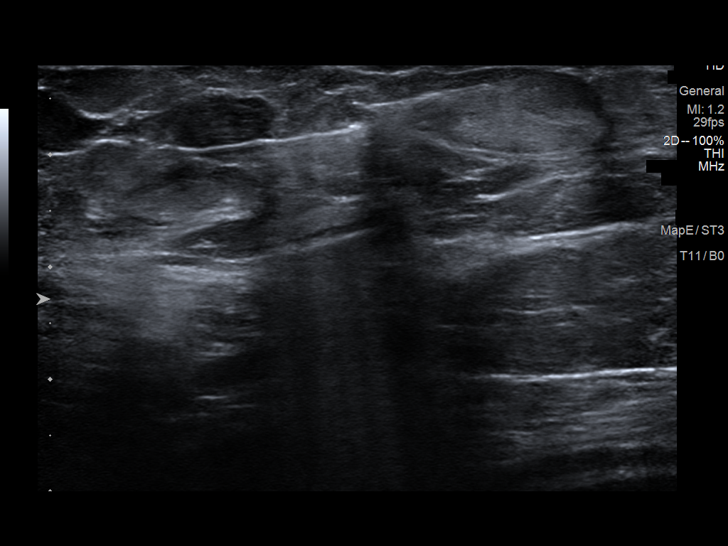
[im 17/21]
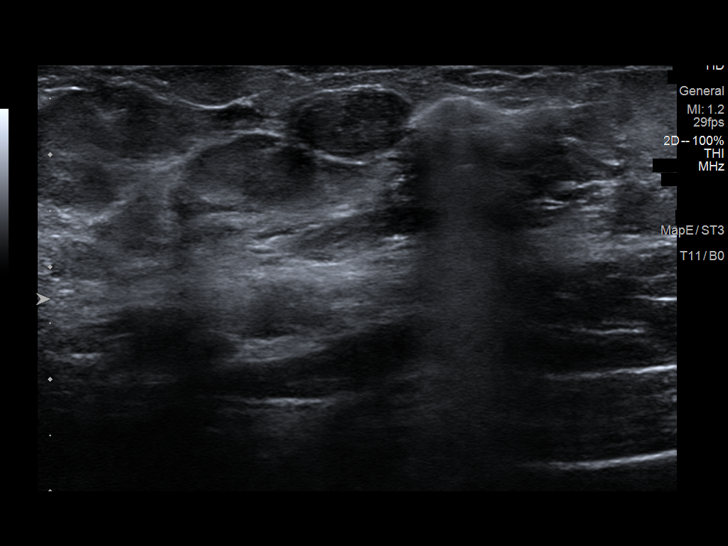
[im 19/21]
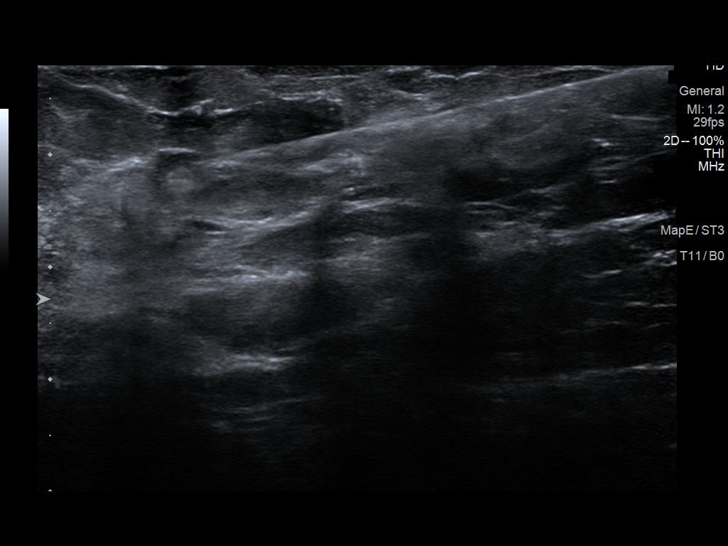
[im 21/21]
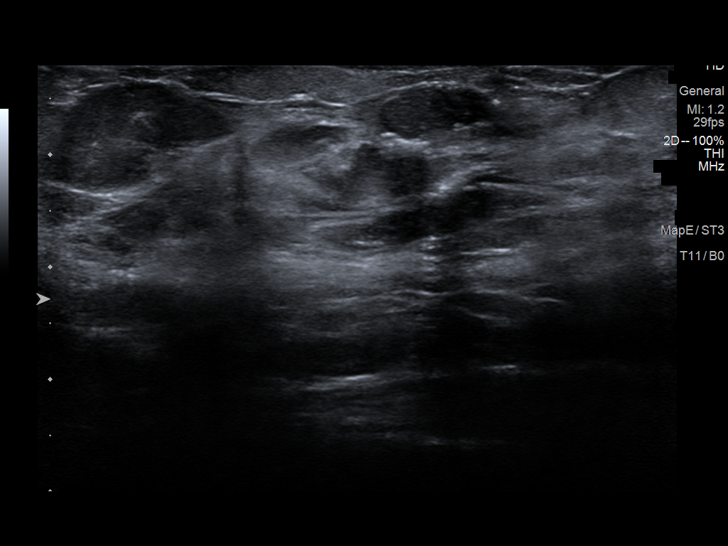

[12 of 21 positions shown; findings below may reference images not displayed]



Preliminary ultrasound of the left axilla demonstrated multiple
normal appearing left axillary lymph nodes. The mild cortical
thickening seen involving 3 of the lymph nodes on 02/20/2020 with no
longer demonstrated. Therefore, these represent a reactive lymph
nodes and do not need biopsy.

Lesion quadrant: Lower outer quadrant

Using sterile technique and 1% Lidocaine as local anesthetic, under
direct ultrasound visualization, a 12 gauge Oxendine device was
used to perform biopsy of the recently demonstrated 1.2 cm mass in
the 5 o'clock position of the left breast using a lateral approach.
At the conclusion of the procedure a ribbon shaped tissue marker
clip was deployed into the biopsy cavity.

Follow up 2 view mammogram was performed and dictated separately
IMPRESSION: 1. Successful ultrasound guided biopsy of the recently demonstrated
1.2 cm mass in the 5 o'clock position of the left breast.
2. The previously demonstrated 3 left axillary lymph nodes with mild
cortical thickening now have normal appearances. Therefore, these
were reactive lymph nodes.

ADDENDUM:
Pathology revealed FIBROADENOMA WITH USUAL DUCTAL HYPERPLASIA of the
LEFT breast, 5 o'clock. This was found to be concordant by Dr.
Dascalu Pintori.

Pathology results were discussed with the patient by telephone. The
patient reported doing well after the biopsy with tenderness at the
site. Post biopsy instructions and care were reviewed and questions
were answered. The patient was encouraged to call The [REDACTED]

The patient was instructed to return for annual screening
mammography and informed a reminder notice would be sent regarding
this appointment.

Pathology results reported by Eliheyder Gitti RN on 03/29/2020.



Preliminary ultrasound of the left axilla demonstrated multiple
normal appearing left axillary lymph nodes. The mild cortical
thickening seen involving 3 of the lymph nodes on 02/20/2020 with no
longer demonstrated. Therefore, these represent a reactive lymph
nodes and do not need biopsy.

Lesion quadrant: Lower outer quadrant

Using sterile technique and 1% Lidocaine as local anesthetic, under
direct ultrasound visualization, a 12 gauge Oxendine device was
used to perform biopsy of the recently demonstrated 1.2 cm mass in
the 5 o'clock position of the left breast using a lateral approach.
At the conclusion of the procedure a ribbon shaped tissue marker
clip was deployed into the biopsy cavity.

Follow up 2 view mammogram was performed and dictated separately
IMPRESSION: 1. Successful ultrasound guided biopsy of the recently demonstrated
1.2 cm mass in the 5 o'clock position of the left breast.
2. The previously demonstrated 3 left axillary lymph nodes with mild
cortical thickening now have normal appearances. Therefore, these
were reactive lymph nodes.

## 2022-07-13 IMAGING — MG MM BREAST LOCALIZATION CLIP
4 series · 4 of 12 positions shown · non-contrast
Comparison: Previous exam(s).

CLINICAL DATA: Status post ultrasound-guided core needle biopsy of
a 1.2 cm mass in the 5 o'clock position of the left breast.

EXAM:
DIAGNOSTIC LEFT MAMMOGRAM POST ULTRASOUND BIOPSY

[L ML synth-2D]
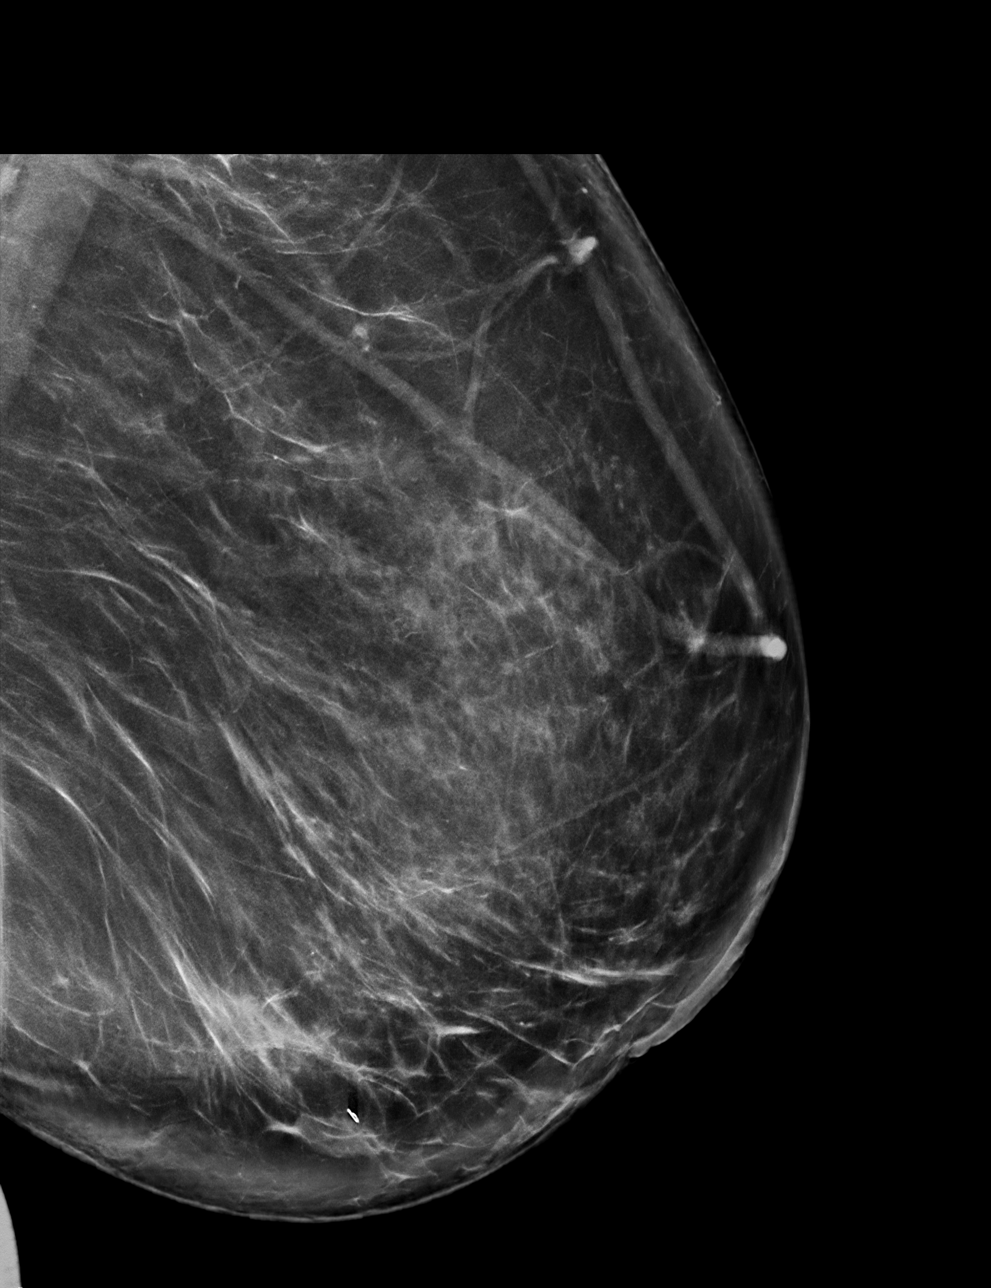

[L CC synth-2D]
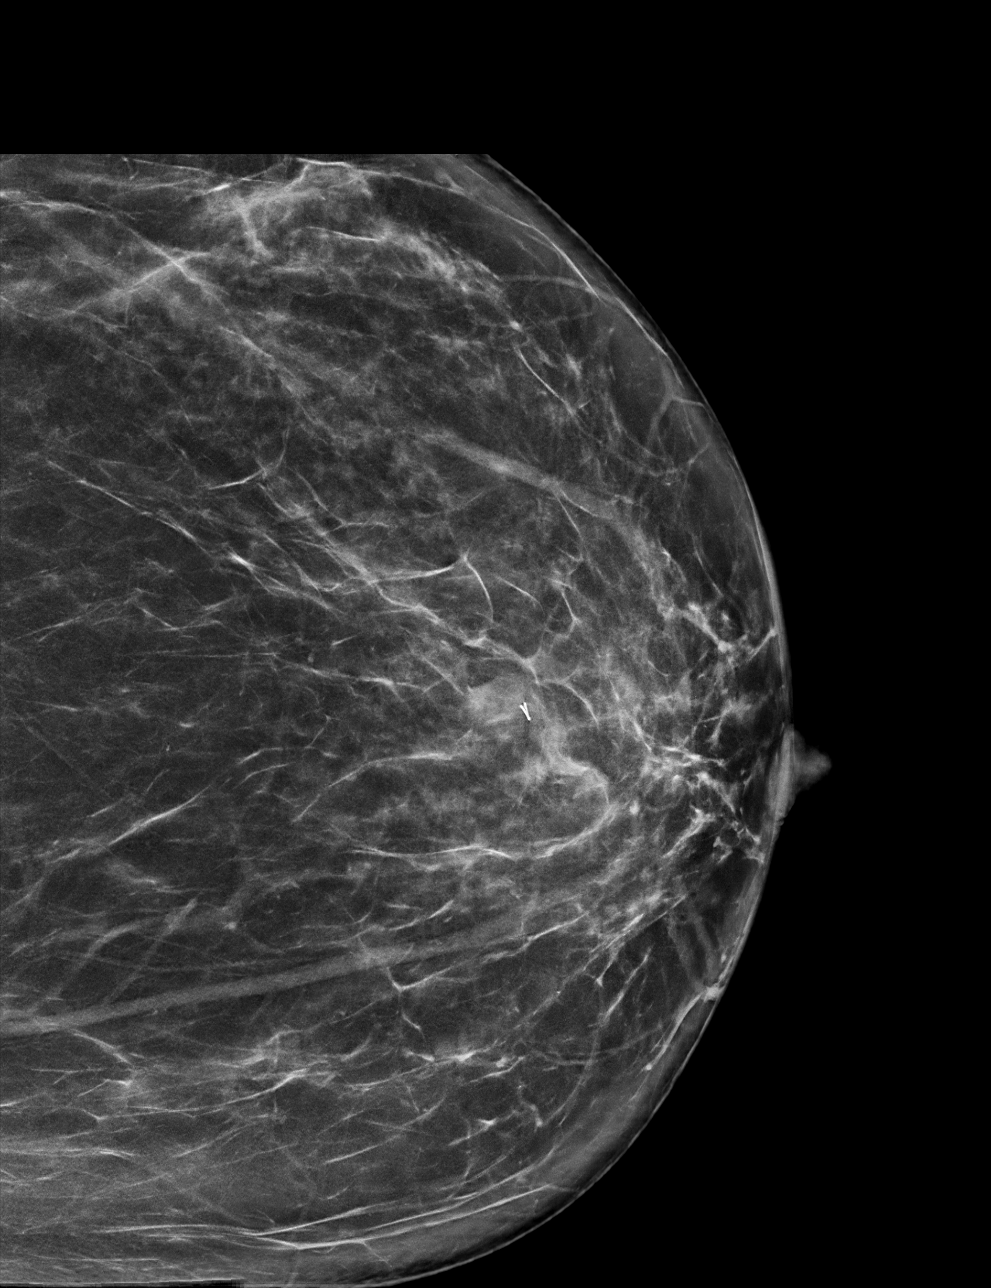

[L ML tomo · tomo slice 47/92.0]
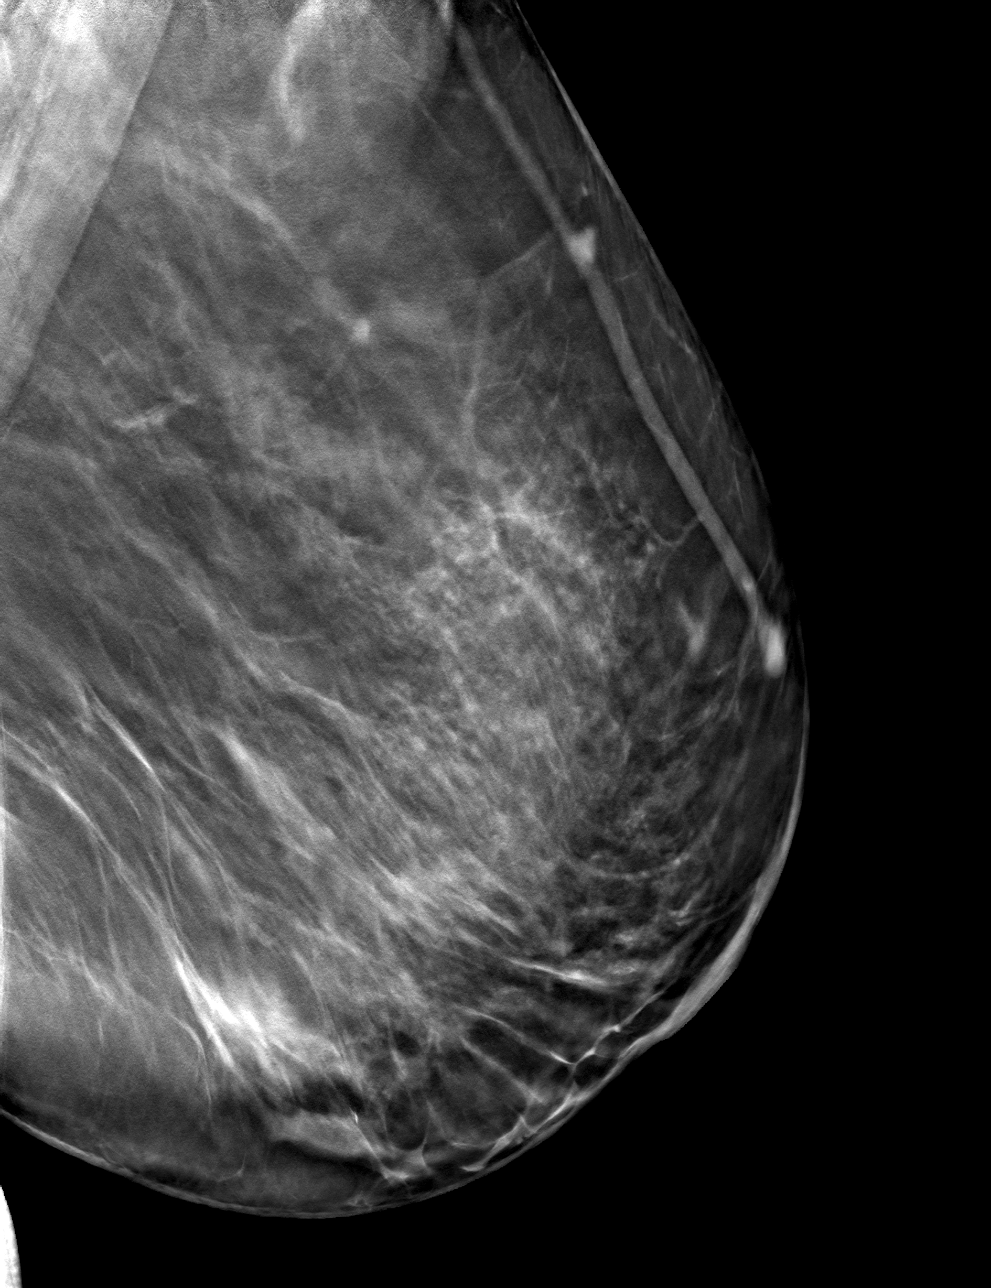

[L CC tomo · tomo slice 39/76.0]
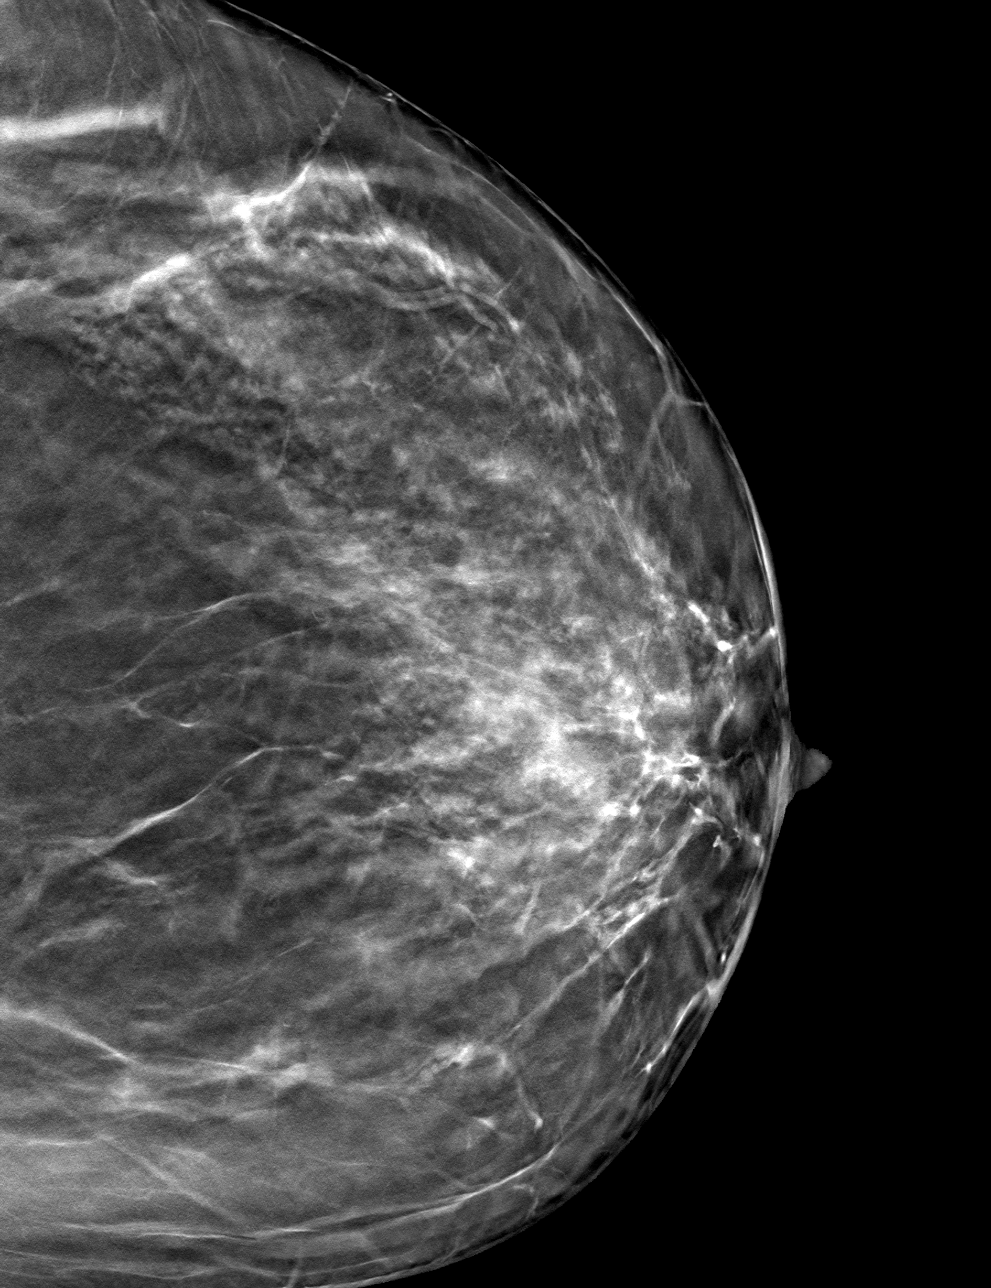

[4 of 12 positions shown; findings below may reference images not displayed]

FINDINGS: Mammographic images were obtained following ultrasound guided biopsy
of the recently demonstrated 1.2 cm mass in the 5 o'clock position
of the left breast. The biopsy marking clip is in expected position
at the superior aspect of the biopsied mass.
IMPRESSION: Appropriate positioning of the ribbon shaped biopsy marking clip at
the site of biopsy in the 5 o'clock position of the left breast.

Final Assessment: Post Procedure Mammograms for Marker Placement

## 2022-07-19 ENCOUNTER — Other Ambulatory Visit: Payer: Self-pay | Admitting: Internal Medicine

## 2022-07-19 DIAGNOSIS — Z1231 Encounter for screening mammogram for malignant neoplasm of breast: Secondary | ICD-10-CM

## 2022-07-21 DIAGNOSIS — Z1231 Encounter for screening mammogram for malignant neoplasm of breast: Secondary | ICD-10-CM

## 2022-08-04 ENCOUNTER — Encounter: Payer: Self-pay | Admitting: Internal Medicine

## 2022-08-04 ENCOUNTER — Ambulatory Visit: Payer: Commercial Managed Care - HMO | Admitting: Internal Medicine

## 2022-08-04 VITALS — BP 126/84 | HR 78 | Ht 62.0 in | Wt 162.4 lb

## 2022-08-04 DIAGNOSIS — E782 Mixed hyperlipidemia: Secondary | ICD-10-CM

## 2022-08-04 DIAGNOSIS — K5900 Constipation, unspecified: Secondary | ICD-10-CM

## 2022-08-04 DIAGNOSIS — L219 Seborrheic dermatitis, unspecified: Secondary | ICD-10-CM | POA: Diagnosis not present

## 2022-08-04 DIAGNOSIS — M5417 Radiculopathy, lumbosacral region: Secondary | ICD-10-CM | POA: Diagnosis not present

## 2022-08-04 DIAGNOSIS — R7989 Other specified abnormal findings of blood chemistry: Secondary | ICD-10-CM

## 2022-08-04 DIAGNOSIS — Z981 Arthrodesis status: Secondary | ICD-10-CM

## 2022-08-04 DIAGNOSIS — N938 Other specified abnormal uterine and vaginal bleeding: Secondary | ICD-10-CM

## 2022-08-04 DIAGNOSIS — E559 Vitamin D deficiency, unspecified: Secondary | ICD-10-CM | POA: Diagnosis not present

## 2022-08-04 DIAGNOSIS — R739 Hyperglycemia, unspecified: Secondary | ICD-10-CM

## 2022-08-04 MED ORDER — KETOCONAZOLE 2 % EX SHAM: 1.0000 | MEDICATED_SHAMPOO | CUTANEOUS | 0 refills | Status: DC

## 2022-08-04 NOTE — Patient Instructions (Signed)
Please continue taking medications as prescribed.  Please continue to maintain adequate hydration by taking at least 64 ounces fluid in a day.

## 2022-08-04 NOTE — Assessment & Plan Note (Signed)
Advised to take Vitamin D 5000 IU QD

## 2022-08-04 NOTE — Assessment & Plan Note (Signed)
Followed by orthopedic surgery at [facility] SI joint injections Mobic as needed for pain

## 2022-08-04 NOTE — Assessment & Plan Note (Signed)
From L3-S1 for lumbar radiculopathy Followed by orthopedic surgery at [facility] SI joint injections Mobic as needed for pain

## 2022-08-04 NOTE — Assessment & Plan Note (Signed)
Patchy hair loss could be due to seborrheic dermatitis Ketoconazole shampoo prescribed Referred to dermatology Her advised to take hair, skin and nail multivitamin

## 2022-08-04 NOTE — Assessment & Plan Note (Signed)
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
## 2022-08-04 NOTE — Assessment & Plan Note (Signed)
Has slightly improved with Mirena Followed by Ob.Gyn. Can discuss about endometrial ablation or umbilical artery embolization with her Ob.Gyn.

## 2022-09-06 ENCOUNTER — Ambulatory Visit: Payer: BC Managed Care – PPO | Admitting: Dermatology

## 2022-09-07 ENCOUNTER — Ambulatory Visit
Admission: RE | Admit: 2022-09-07 | Discharge: 2022-09-07 | Disposition: A | Discharge status: Home / Self Care | Payer: Commercial Managed Care - HMO | Source: Ambulatory Visit

## 2022-09-07 DIAGNOSIS — Z1231 Encounter for screening mammogram for malignant neoplasm of breast: Secondary | ICD-10-CM

## 2022-11-09 ENCOUNTER — Ambulatory Visit: Payer: Commercial Managed Care - HMO | Admitting: Dermatology

## 2022-11-22 ENCOUNTER — Ambulatory Visit (INDEPENDENT_AMBULATORY_CARE_PROVIDER_SITE_OTHER): Payer: Commercial Managed Care - HMO | Admitting: Internal Medicine

## 2022-11-22 ENCOUNTER — Encounter: Payer: Self-pay | Admitting: Internal Medicine

## 2022-11-22 VITALS — BP 114/76 | HR 88 | Resp 16 | Ht 62.0 in | Wt 170.0 lb

## 2022-11-22 DIAGNOSIS — R1900 Intra-abdominal and pelvic swelling, mass and lump, unspecified site: Secondary | ICD-10-CM | POA: Diagnosis not present

## 2022-11-22 DIAGNOSIS — E6609 Other obesity due to excess calories: Secondary | ICD-10-CM

## 2022-11-22 DIAGNOSIS — E66811 Obesity, class 1: Secondary | ICD-10-CM | POA: Insufficient documentation

## 2022-11-22 DIAGNOSIS — Z6831 Body mass index (BMI) 31.0-31.9, adult: Secondary | ICD-10-CM

## 2022-11-22 NOTE — Progress Notes (Signed)
Acute Office Visit  Subjective:    Patient ID: Traci Schmidt, female    DOB: 04/28/1981, 42 y.o.   MRN: [id number]  Chief Complaint  Patient presents with   Mass    In lower abdomen just below her c section scar. Wants to rule out it being a hernia     HPI Patient is in today for evaluation for a pelvic mass noted on physical exam recently when she went for liposuction consultation. She was told of a pelvic bulge. She denies noticing any bulge herself. Denies any abdominal pain or discomfort. She has h/o c-section.  Past Medical History:  Diagnosis Date   Abnormal uterine bleeding    Allergy    seasonal   Fibroid    Gallstones    Thyroid disease     Past Surgical History:  Procedure Laterality Date   ANAL RECTAL MANOMETRY N/A 07/20/2021   Procedure: ANO RECTAL MANOMETRY;  Surgeon: Tressia Danas, MD;  Location: [facility];  Service: Gastroenterology;  Laterality: N/A;   BALLOON DILATION N/A 11/12/2013   Procedure: BALLOON DILATION WITH STONE EXTRACTION;  Surgeon: Corbin Ade, MD;  Location: [facility];  Service: Endoscopy;  Laterality: N/A;   BREAST REDUCTION SURGERY  05/2020   CESAREAN SECTION     CESAREAN SECTION N/A    Phreesia 01/13/2020   cesarean section  2018   CHOLECYSTECTOMY N/A 11/05/2013   Dr. Lovell Sheehan   ERCP N/A 11/12/2013   Procedure: ENDOSCOPIC RETROGRADE CHOLANGIOPANCREATOGRAPHY (ERCP), SPHINCTEROTOMY, BALLOON DILATION WITH STONE EXTRACTION;  Surgeon: Corbin Ade, MD;  Location: [facility];  Service: Endoscopy;  Laterality: N/A;   FRACTURE SURGERY N/A    Phreesia 01/13/2020   HIP SURGERY Left 07/2020   Done at [facility]   PELVIC FRACTURE SURGERY  2002   SPHINCTEROTOMY N/A 11/12/2013   Procedure: Dennison Mascot;  Surgeon: Corbin Ade, MD;  Location: [facility];  Service: Endoscopy;  Laterality: N/A;    Family History  Problem Relation Age of Onset   Hypertension Mother    Breast cancer Mother 4   Hyperlipidemia Father    Diabetes Father     Diabetes Brother    Hyperlipidemia Brother    Hyperlipidemia Brother    Colon cancer Neg Hx     Social History   Socioeconomic History   Marital status: Married    Spouse name: Not on file   Number of children: 3   Years of education: Not on file   Highest education level: Not on file  Occupational History   Not on file  Tobacco Use   Smoking status: Never   Smokeless tobacco: Never  Vaping Use   Vaping Use: Never used  Substance and Sexual Activity   Alcohol use: No   Drug use: No   Sexual activity: Yes    Birth control/protection: I.U.D.  Other Topics Concern   Not on file  Social History Narrative   Married for 16 years,lives with husband and 3 kids.Stay at home.   Social Determinants of Health   Financial Resource Strain: Low Risk  (06/22/2017)   Overall Financial Resource Strain (CARDIA)    Difficulty of Paying Living Expenses: Not hard at all  Food Insecurity: No Food Insecurity (06/22/2017)   Hunger Vital Sign    Worried About Running Out of Food in the Last Year: Never true    Ran Out of Food in the Last Year: Never true  Transportation Needs: No Transportation Needs (06/22/2017)   PRAPARE - Transportation  Lack of Transportation (Medical): No    Lack of Transportation (Non-Medical): No  Physical Activity: Inactive (06/22/2017)   Exercise Vital Sign    Days of Exercise per Week: 0 days    Minutes of Exercise per Session: 0 min  Stress: No Stress Concern Present (06/22/2017)   [employer] of Occupational Health - Occupational Stress Questionnaire    Feeling of Stress : Not at all  Social Connections: Unknown (01/30/2018)   Social Connection and Isolation Panel [NHANES]    Frequency of Communication with Friends and Family: Not asked    Frequency of Social Gatherings with Friends and Family: Not asked    Attends Religious Services: Not asked    Active Member of Clubs or Organizations: Not asked    Attends Banker Meetings: Not asked    Marital  Status: Not asked  Intimate Partner Violence: Not At Risk (01/16/2020)   Humiliation, Afraid, Rape, and Kick questionnaire    Fear of Current or Ex-Partner: No    Emotionally Abused: No    Physically Abused: No    Sexually Abused: No    Outpatient Medications Prior to Visit  Medication Sig Dispense Refill   acetaminophen (TYLENOL) 500 MG tablet Take 500 mg by mouth every 6 (six) hours as needed.     levonorgestrel (MIRENA, 52 MG,) 20 MCG/24HR IUD 1 each by Intrauterine route once.     meloxicam (MOBIC) 15 MG tablet Take 15 mg by mouth daily as needed.     ketoconazole (NIZORAL) 2 % shampoo Apply 1 Application topically 2 (two) times a week. (Patient not taking: Reported on 11/22/2022) 120 mL 0   triamcinolone cream (KENALOG) 0.1 % Apply 1 application topically daily as needed. (Patient not taking: Reported on 11/22/2022) 453 g 2   No facility-administered medications prior to visit.    Allergies  Allergen Reactions   Other     Seasonal allergies    Review of Systems  Constitutional:  Negative for chills and fever.  HENT:  Negative for congestion, sinus pressure, sinus pain and sore throat.   Eyes:  Negative for pain and discharge.  Respiratory:  Negative for cough and shortness of breath.   Cardiovascular:  Negative for chest pain and palpitations.  Gastrointestinal:  Positive for constipation. Negative for abdominal pain, diarrhea, nausea and vomiting.  Endocrine: Negative for polydipsia and polyuria.  Genitourinary:  Negative for dysuria and hematuria.  Musculoskeletal:  Positive for arthralgias and back pain. Negative for neck pain and neck stiffness.  Skin:  Negative for rash.       Hair loss  Neurological:  Negative for dizziness and weakness.  Psychiatric/Behavioral:  Negative for agitation and behavioral problems.        Objective:    Physical Exam Vitals reviewed.  Constitutional:      General: She is not in acute distress.    Appearance: She is not diaphoretic.   HENT:     Head: Normocephalic and atraumatic.     Nose: Nose normal.     Mouth/Throat:     Mouth: Mucous membranes are moist.  Eyes:     General: No scleral icterus.    Extraocular Movements: Extraocular movements intact.  Cardiovascular:     Rate and Rhythm: Normal rate and regular rhythm.     Pulses: Normal pulses.     Heart sounds: Normal heart sounds. No murmur heard. Pulmonary:     Breath sounds: Normal breath sounds. No wheezing or rales.  Abdominal:  Comments: Horizontal C-section scar; bulging below the scar, unclear margins -nontender  Musculoskeletal:     Cervical back: Neck supple. No tenderness.     Right lower leg: No edema.     Left lower leg: No edema.  Skin:    General: Skin is warm.     Findings: No rash.  Neurological:     General: No focal deficit present.     Mental Status: She is alert and oriented to person, place, and time.  Psychiatric:        Mood and Affect: Mood normal.        Behavior: Behavior normal.     BP 114/76   Pulse 88   Resp 16   Ht 5\' 2"  (1.575 m)   Wt 170 lb (77.1 kg)   SpO2 96%   BMI 31.09 kg/m  Wt Readings from Last 3 Encounters:  11/22/22 170 lb (77.1 kg)  08/04/22 162 lb 6.4 oz (73.7 kg)  02/23/22 160 lb (72.6 kg)        Assessment & Plan:   Problem List Items Addressed This Visit       Other   Pelvic mass in female - Primary    Unclear if related to pelvic adhesions vs lipoma Unlikely to be inguinal hernia Check US pelvis      Relevant Orders   US Pelvis Complete   Class 1 obesity due to excess calories without serious comorbidity with body mass index (BMI) of 31.0 to 31.9 in adult    BMI Readings from Last 3 Encounters:  11/22/22 31.09 kg/m  08/04/22 29.70 kg/m  02/23/22 29.26 kg/m  Needs to follow low-carb diet and perform moderate exercise/walking at least 150 minutes/week Had recent plastic surgery consultation for liposuction, had discussion about it - advised that it would help cosmetically  only        No orders of the defined types were placed in this encounter.    Anabel Halon, MD

## 2022-11-22 NOTE — Assessment & Plan Note (Signed)
Unclear if related to pelvic adhesions vs lipoma Unlikely to be inguinal hernia Check US pelvis

## 2022-11-22 NOTE — Patient Instructions (Signed)
Please get [place] of pelvis done as scheduled.

## 2022-11-22 NOTE — Assessment & Plan Note (Signed)
BMI Readings from Last 3 Encounters:  11/22/22 31.09 kg/m  08/04/22 29.70 kg/m  02/23/22 29.26 kg/m   Needs to follow low-carb diet and perform moderate exercise/walking at least 150 minutes/week Had recent plastic surgery consultation for liposuction, had discussion about it - advised that it would help cosmetically only

## 2022-12-05 ENCOUNTER — Ambulatory Visit (HOSPITAL_COMMUNITY)
Admission: RE | Admit: 2022-12-05 | Discharge: 2022-12-05 | Disposition: A | Discharge status: Home / Self Care | Payer: Commercial Managed Care - HMO | Source: Ambulatory Visit | Attending: Internal Medicine | Admitting: Internal Medicine

## 2022-12-05 ENCOUNTER — Other Ambulatory Visit: Payer: Self-pay | Admitting: Internal Medicine

## 2022-12-05 DIAGNOSIS — R1900 Intra-abdominal and pelvic swelling, mass and lump, unspecified site: Secondary | ICD-10-CM

## 2022-12-05 DIAGNOSIS — E6609 Other obesity due to excess calories: Secondary | ICD-10-CM

## 2023-01-25 ENCOUNTER — Ambulatory Visit: Payer: Commercial Managed Care - HMO | Admitting: Dermatology

## 2023-02-14 ENCOUNTER — Encounter: Payer: Self-pay | Admitting: Internal Medicine

## 2023-02-14 ENCOUNTER — Other Ambulatory Visit: Payer: Self-pay | Admitting: Internal Medicine

## 2023-02-14 DIAGNOSIS — R7989 Other specified abnormal findings of blood chemistry: Secondary | ICD-10-CM

## 2023-02-14 DIAGNOSIS — E559 Vitamin D deficiency, unspecified: Secondary | ICD-10-CM

## 2023-02-14 DIAGNOSIS — L659 Nonscarring hair loss, unspecified: Secondary | ICD-10-CM

## 2023-02-14 LAB — TSH: TSH: 1.68 (ref 0.41–5.90)

## 2023-08-09 ENCOUNTER — Encounter: Payer: Commercial Managed Care - HMO | Admitting: Internal Medicine

## 2024-03-27 ENCOUNTER — Ambulatory Visit: Admitting: Allergy

## 2024-03-27 NOTE — Progress Notes (Deleted)
 New Patient Note  RE: Traci Schmidt MRN: [id number] DOB: [birth year] Date of Office Visit: 03/27/2024  Consult requested by: Siganporia, Arnaz, FNP Primary care provider: Tobie Suzzane POUR, MD  Chief Complaint: No chief complaint on file.  History of Present Illness: I had the pleasure of seeing Traci Schmidt for initial evaluation at the [facility] on 03/27/2024. She is a 43 y.o. female, who is referred here by Siganporia, Arnaz, FNP for the evaluation of urticaria.  Discussed the use of AI scribe software for clinical note transcription with the patient, who gave verbal consent to proceed.  History of Present Illness             Rash started about *** ago. Mainly occurs on her ***. Describes them as ***. Individual rashes lasts about ***. No ecchymosis upon resolution. Associated symptoms include: ***.  Frequency of episodes: ***. Suspected triggers are ***. Denies any *** fevers, chills, changes in medications, foods, personal care products or recent infections. She has tried the following therapies: *** with *** benefit. Systemic steroids ***. Currently on ***.  Previous work up includes: ***. Previous history of rash/hives: ***. Patient is up to date with the following cancer screening tests: ***.  Assessment and Plan: Traci Schmidt is a 43 y.o. female with: ***  Assessment and Plan               No follow-ups on file.  No orders of the defined types were placed in this encounter.  Lab Orders  No laboratory test(s) ordered today    Other allergy screening: Asthma: {Blank single:19197::yes,no} Rhino conjunctivitis: {Blank single:19197::yes,no} Food allergy: {Blank single:19197::yes,no} Medication allergy: {Blank single:19197::yes,no} Hymenoptera allergy: {Blank single:19197::yes,no} Urticaria: {Blank single:19197::yes,no} Eczema:{Blank single:19197::yes,no} History of recurrent infections suggestive  of immunodeficency: {Blank single:19197::yes,no}  Diagnostics: Spirometry:  Tracings reviewed. Her effort: {Blank single:19197::Good reproducible efforts.,It was hard to get consistent efforts and there is a question as to whether this reflects a maximal maneuver.,Poor effort, data can not be interpreted.} FVC: ***L FEV1: ***L, ***% predicted FEV1/FVC ratio: ***% Interpretation: {Blank single:19197::Spirometry consistent with mild obstructive disease,Spirometry consistent with moderate obstructive disease,Spirometry consistent with severe obstructive disease,Spirometry consistent with possible restrictive disease,Spirometry consistent with mixed obstructive and restrictive disease,Spirometry uninterpretable due to technique,Spirometry consistent with normal pattern,No overt abnormalities noted given today's efforts}.  Please see scanned spirometry results for details.  Skin Testing: {Blank single:19197::Select foods,Environmental allergy panel,Environmental allergy panel and select foods,Food allergy panel,None,Deferred due to recent antihistamines use}. *** Results discussed with patient/family.   Past Medical History: Patient Active Problem List   Diagnosis Date Noted  . Hair loss 02/14/2023  . Pelvic mass in female 11/22/2022  . Class 1 obesity due to excess calories without serious comorbidity with body mass index (BMI) of 31.0 to 31.9 in adult 11/22/2022  . Seborrheic dermatitis 01/31/2022  . S/P lumbar fusion 10/13/2021  . Constipation 03/11/2021  . Iliopsoas bursitis of left hip 12/04/2020  . S/P bilateral breast reduction 06/21/2020  . Acetabular labrum tear, left, subsequent encounter 05/02/2020  . Acquired leg length discrepancy 04/17/2020  . Female genital prolapse 09/04/2018  . DUB (dysfunctional uterine bleeding) 07/23/2018  . Pelvic deformity 04/24/2018  . Lumbosacral radiculopathy at L5 03/15/2018  . Vitamin D  deficiency 01/01/2018    Past Medical History:  Diagnosis Date  . Abnormal uterine bleeding   . Allergy    seasonal  . Fibroid   . Gallstones   . Thyroid  disease    Past Surgical History: Past Surgical  History:  Procedure Laterality Date  . ANAL RECTAL MANOMETRY N/A 07/20/2021   Procedure: ANO RECTAL MANOMETRY;  Surgeon: Eda Iha, MD;  Location: [facility];  Service: Gastroenterology;  Laterality: N/A;  . BALLOON DILATION N/A 11/12/2013   Procedure: BALLOON DILATION WITH STONE EXTRACTION;  Surgeon: Lamar CHRISTELLA Hollingshead, MD;  Location: [facility];  Service: Endoscopy;  Laterality: N/A;  . BREAST REDUCTION SURGERY  05/2020  . CESAREAN SECTION    . CESAREAN SECTION N/A    Phreesia 01/13/2020  . cesarean section  2018  . CHOLECYSTECTOMY N/A 11/05/2013   Dr. Mavis  . ERCP N/A 11/12/2013   Procedure: ENDOSCOPIC RETROGRADE CHOLANGIOPANCREATOGRAPHY (ERCP), SPHINCTEROTOMY, BALLOON DILATION WITH STONE EXTRACTION;  Surgeon: Lamar CHRISTELLA Hollingshead, MD;  Location: [facility];  Service: Endoscopy;  Laterality: N/A;  . FRACTURE SURGERY N/A    Phreesia 01/13/2020  . HIP SURGERY Left 07/2020   Done at [facility]  . PELVIC FRACTURE SURGERY  2002  . SPHINCTEROTOMY N/A 11/12/2013   Procedure: SPHINCTEROTOMY;  Surgeon: Lamar CHRISTELLA Hollingshead, MD;  Location: [facility];  Service: Endoscopy;  Laterality: N/A;   Medication List:  Current Outpatient Medications  Medication Sig Dispense Refill  . acetaminophen  (TYLENOL ) 500 MG tablet Take 500 mg by mouth every 6 (six) hours as needed.    . ketoconazole  (NIZORAL ) 2 % shampoo Apply 1 Application topically 2 (two) times a week. (Patient not taking: Reported on 11/22/2022) 120 mL 0  . levonorgestrel  (MIRENA , 52 MG,) 20 MCG/24HR IUD 1 each by Intrauterine route once.    . meloxicam (MOBIC) 15 MG tablet Take 15 mg by mouth daily as needed.    . triamcinolone  cream (KENALOG ) 0.1 % Apply 1 application topically daily as needed. (Patient not taking: Reported on 11/22/2022) 453 g 2   No current  facility-administered medications for this visit.   Allergies: Allergies  Allergen Reactions  . Other     Seasonal allergies   Social History: Social History   Socioeconomic History  . Marital status: Married    Spouse name: Not on file  . Number of children: 3  . Years of education: Not on file  . Highest education level: Not on file  Occupational History  . Not on file  Tobacco Use  . Smoking status: Never  . Smokeless tobacco: Never  Vaping Use  . Vaping status: Never Used  Substance and Sexual Activity  . Alcohol use: No  . Drug use: No  . Sexual activity: Yes    Birth control/protection: I.U.D.  Other Topics Concern  . Not on file  Social History Narrative   Married for 16 years,lives with husband and 3 kids.Stay at home.   Social Drivers of Corporate investment banker Strain: Low Risk  (06/22/2017)   Overall Financial Resource Strain (CARDIA)   . Difficulty of Paying Living Expenses: Not hard at all  Food Insecurity: Low Risk  (03/21/2024)   Received from [facility]   Hunger Vital Sign   . Within the past 12 months, you worried that your food would run out before you got money to buy more: Never true   . Within the past 12 months, the food you bought just didn't last and you didn't have money to get more. : Never true  Transportation Needs: No Transportation Needs (03/21/2024)   Received from [facility]   . In the past 12 months, has lack of reliable transportation kept you from medical appointments, meetings, work or from getting things needed  for daily living? : No  Physical Activity: Inactive (06/22/2017)   Exercise Vital Sign   . Days of Exercise per Week: 0 days   . Minutes of Exercise per Session: 0 min  Stress: No Stress Concern Present (06/22/2017)   [employer] of Occupational Health - Occupational Stress Questionnaire   . Feeling of Stress : Not at all  Social Connections: Unknown (01/30/2018)   Social Connection and  Isolation Panel   . Frequency of Communication with Friends and Family: Not asked   . Frequency of Social Gatherings with Friends and Family: Not asked   . Attends Religious Services: Not asked   . Active Member of Clubs or Organizations: Not asked   . Attends Banker Meetings: Not asked   . Marital Status: Not asked   Lives in a ***. Smoking: *** Occupation: ***  Environmental History: Water  Damage/mildew in the house: {Blank single:19197::yes,no} Carpet in the family room: {Blank single:19197::yes,no} Carpet in the bedroom: {Blank single:19197::yes,no} Heating: {Blank single:19197::electric,gas,heat pump} Cooling: {Blank single:19197::central,window,heat pump} Pet: {Blank single:19197::yes ***,no}  Family History: Family History  Problem Relation Age of Onset  . Hypertension Mother   . Breast cancer Mother 24  . Hyperlipidemia Father   . Diabetes Father   . Diabetes Brother   . Hyperlipidemia Brother   . Hyperlipidemia Brother   . Colon cancer Neg Hx    Problem                               Relation Asthma                                   *** Eczema                                *** Food allergy                          *** Allergic rhino conjunctivitis     ***  Review of Systems  Constitutional:  Negative for appetite change, chills, fever and unexpected weight change.  HENT:  Negative for congestion and rhinorrhea.   Eyes:  Negative for itching.  Respiratory:  Negative for cough, chest tightness, shortness of breath and wheezing.   Cardiovascular:  Negative for chest pain.  Gastrointestinal:  Negative for abdominal pain.  Genitourinary:  Negative for difficulty urinating.  Skin:  Negative for rash.  Neurological:  Negative for headaches.    Objective: There were no vitals taken for this visit. There is no height or weight on file to calculate BMI. Physical Exam Vitals and nursing note reviewed.  Constitutional:       Appearance: Normal appearance. She is well-developed.  HENT:     Head: Normocephalic and atraumatic.     Right Ear: Tympanic membrane and external ear normal.     Left Ear: Tympanic membrane and external ear normal.     Nose: Nose normal.     Mouth/Throat:     Mouth: Mucous membranes are moist.     Pharynx: Oropharynx is clear.  Eyes:     Conjunctiva/sclera: Conjunctivae normal.  Cardiovascular:     Rate and Rhythm: Normal rate and regular rhythm.     Heart sounds: Normal heart sounds. No murmur heard.    No  friction rub. No gallop.  Pulmonary:     Effort: Pulmonary effort is normal.     Breath sounds: Normal breath sounds. No wheezing, rhonchi or rales.  Musculoskeletal:     Cervical back: Neck supple.  Skin:    General: Skin is warm.     Findings: No rash.  Neurological:     Mental Status: She is alert and oriented to person, place, and time.  Psychiatric:        Behavior: Behavior normal.   The plan was reviewed with the patient/family, and all questions/concerned were addressed.  It was my pleasure to see Traci Schmidt today and participate in her care. Please feel free to contact me with any questions or concerns.  Sincerely,  Orlan Cramp, DO Allergy & Immunology  [facility]  [place] office: [phone] [facility] office: [phone]

## 2024-04-01 ENCOUNTER — Other Ambulatory Visit: Payer: Self-pay

## 2024-04-01 ENCOUNTER — Encounter: Payer: Self-pay | Admitting: Internal Medicine

## 2024-04-01 ENCOUNTER — Ambulatory Visit (INDEPENDENT_AMBULATORY_CARE_PROVIDER_SITE_OTHER): Admitting: Internal Medicine

## 2024-04-01 VITALS — BP 118/82 | HR 103 | Temp 98.5°F | Ht 62.0 in | Wt 166.8 lb

## 2024-04-01 DIAGNOSIS — J310 Chronic rhinitis: Secondary | ICD-10-CM | POA: Diagnosis not present

## 2024-04-01 DIAGNOSIS — L501 Idiopathic urticaria: Secondary | ICD-10-CM

## 2024-04-01 MED ORDER — FAMOTIDINE 20 MG PO TABS: 20.0000 mg | ORAL_TABLET | Freq: Two times a day (BID) | ORAL | 5 refills | Status: AC | PRN

## 2024-04-01 NOTE — Progress Notes (Signed)
 NEW PATIENT  Date of Service/Encounter:  04/01/24  Consult requested by: Tobie Suzzane POUR, MD   Subjective:   Traci Schmidt (DOB: 1980-11-30) is a 43 y.o. female who presents to the clinic on 04/01/2024 with a chief complaint of Allergies and Urticaria (Tongue swelling, red patchy skin on her body.) .    History obtained from: chart review and patient.   Hives/Swelling:  Started around August 12. Having red patches that would come and go Had tongue and eye swelling, usually unilateral. Taking Xyzal and Zyrtec daily.  Unable to hold it for even half a day as the hives recur.   Rash is itchy, red, raised.  No scarring, no pain.     Usually resolve within an hour with medication. No new food No illness  Switched from meloxicam to celebrex, still with hives.  Had been on meloxicam for over 2 years without any issues.   No insect bites  Rhinitis:  Started many years ago.  Symptoms include: nasal congestion and rhinorrhea  Occurs seasonally-Fall  Potential triggers: dust   Treatments tried:  Anti histamines PRN   Previous allergy testing: no History of sinus surgery: no Nonallergic triggers: none     Reviewed:  03/21/2024: seen by PCP for elevated BP and obesity, started on phentermine.   02/19/2024: seen by Siganporia NP for urticaria and facial swelling.  Taken Claritin/Xyzal. Rx Epipen  and referred to Allergy.   10/16/2023: seen by Madie Beers for OA of L hip, on Robaxin, meloxicam, lyrica , celebrex.   Past Medical History: Past Medical History:  Diagnosis Date   Abnormal uterine bleeding    Allergy    seasonal   Angio-edema    Eczema    Fibroid    Gallstones    Thyroid  disease    Urticaria    Past Surgical History: Past Surgical History:  Procedure Laterality Date   ANAL RECTAL MANOMETRY N/A 07/20/2021   Procedure: ANO RECTAL MANOMETRY;  Surgeon: Eda Iha, MD;  Location: [facility];  Service: Gastroenterology;  Laterality: N/A;   BALLOON  DILATION N/A 11/12/2013   Procedure: BALLOON DILATION WITH STONE EXTRACTION;  Surgeon: Lamar CHRISTELLA Hollingshead, MD;  Location: [facility];  Service: Endoscopy;  Laterality: N/A;   BREAST REDUCTION SURGERY  05/2020   CESAREAN SECTION     CESAREAN SECTION N/A    Phreesia 01/13/2020   cesarean section  2018   CHOLECYSTECTOMY N/A 11/05/2013   Dr. Mavis   ERCP N/A 11/12/2013   Procedure: ENDOSCOPIC RETROGRADE CHOLANGIOPANCREATOGRAPHY (ERCP), SPHINCTEROTOMY, BALLOON DILATION WITH STONE EXTRACTION;  Surgeon: Lamar CHRISTELLA Hollingshead, MD;  Location: [facility];  Service: Endoscopy;  Laterality: N/A;   FRACTURE SURGERY N/A    Phreesia 01/13/2020   HIP SURGERY Left 07/2020   Done at [facility]   PELVIC FRACTURE SURGERY  2002   SPHINCTEROTOMY N/A 11/12/2013   Procedure: ANNETT;  Surgeon: Lamar CHRISTELLA Hollingshead, MD;  Location: [facility];  Service: Endoscopy;  Laterality: N/A;    Family History: Family History  Problem Relation Age of Onset   Hypertension Mother    Breast cancer Mother 54   Hyperlipidemia Father    Diabetes Father    Diabetes Brother    Hyperlipidemia Brother    Hyperlipidemia Brother    Colon cancer Neg Hx     Social History:  Flooring in bedroom: carpet Pets: none Tobacco use/exposure: none Job: Production designer, theatre/television/film   Medication List:  Allergies as of 04/01/2024       Reactions   Other  Seasonal allergies   Oxycodone  Itching        Medication List        Accurate as of April 01, 2024  2:36 PM. If you have any questions, ask your nurse or doctor.          STOP taking these medications    acetaminophen  500 MG tablet Commonly known as: TYLENOL  Stopped by: Arleta SHAUNNA Blanch   ketoconazole  2 % shampoo Commonly known as: NIZORAL  Stopped by: Arleta SHAUNNA Blanch   levocetirizine 5 MG tablet Commonly known as: XYZAL Stopped by: Arleta SHAUNNA Blanch   Linzess  145 MCG Caps capsule Generic drug: linaclotide  Stopped by: Arleta SHAUNNA Blanch   Mirena  (52 MG) 20 MCG/24HR Iud Generic drug: levonorgestrel  Stopped  by: Arleta SHAUNNA Blanch   predniSONE  10 MG (21) Tbpk tablet Commonly known as: STERAPRED UNI-PAK 21 TAB Stopped by: Arleta SHAUNNA Blanch   triamcinolone  cream 0.1 % Commonly known as: KENALOG  Stopped by: Arleta SHAUNNA Blanch       TAKE these medications    celecoxib 200 MG capsule Commonly known as: CELEBREX Take 200 mg by mouth daily.   dexamethasone  4 MG/ML injection Commonly known as: DECADRON  Inject 4 mg into the vein once.   EPINEPHrine  0.3 mg/0.3 mL Soaj injection Commonly known as: EPI-PEN Inject 0.3 mg into the muscle as needed for anaphylaxis.   loratadine 10 MG tablet Commonly known as: CLARITIN daily.   meloxicam 15 MG tablet Commonly known as: MOBIC Take 15 mg by mouth daily as needed.   methocarbamol 500 MG tablet Commonly known as: ROBAXIN 4 (four) times daily.   minoxidil 2.5 MG tablet Commonly known as: LONITEN Take 2.5 mg by mouth daily.   norethindrone  5 MG tablet Commonly known as: AYGESTIN  Take 5 mg by mouth daily.   phentermine 15 MG capsule Take by mouth every morning.   pregabalin  25 MG capsule Commonly known as: LYRICA  daily.         REVIEW OF SYSTEMS: Pertinent positives and negatives discussed in HPI.   Objective:   Physical Exam: BP 118/82 (BP Location: Right Arm, Patient Position: Sitting, Cuff Size: Normal)   Pulse (!) 103   Temp 98.5 F (36.9 C) (Temporal)   Ht 5' 2 (1.575 m)   Wt 166 lb 12.8 oz (75.7 kg)   SpO2 99%   BMI 30.51 kg/m  Body mass index is 30.51 kg/m. GEN: alert, well developed HEENT: clear conjunctiva, nose with + mild inferior turbinate hypertrophy, pink nasal mucosa, slight clear rhinorrhea, no cobblestoning HEART: regular rate and rhythm, no murmur LUNGS: clear to auscultation bilaterally, no coughing, unlabored respiration ABDOMEN: soft, non distended  SKIN: no rashes or lesions   Assessment:   1. Chronic idiopathic urticaria   2. Chronic rhinitis     Plan/Recommendations:  Urticaria (Hives): - At  this time etiology of hives and swelling is unknown. Hives can be caused by a variety of different triggers including illness/infection, pressure, vibrations, extremes of temperature to name a few however majority of the time there is no identifiable trigger.  - Use Allegra 180mg  twice daily.  If hives are well controlled for 2 weeks, reduce to Allegra 180mg  daily.  If hives worsen, go back to Zyrtec 10mg  in evening and Xyzal 5mg  in daytime. - If hives flare up despite that, add Pepcid  20mg  twice daily.  Chronic Rhinitis: - Due to turbinate hypertrophy, seasonal symptoms and unresponsive to over the counter meds, will perform blood testing to identify aeroallergen triggers.  Unable  to do skin testing as she cannot hold anti histamines due to urticaria.  - Use nasal saline rinses before nose sprays such as with Neilmed Sinus Rinse.  Use distilled water .   - Use Allegra 180 mg daily. Dosing based on hives.      Return in about 2 months (around 06/01/2024).  Arleta Blanch, MD [facility] 

## 2024-04-01 NOTE — Patient Instructions (Addendum)
 Chronic Urticaria/Angioedema (Hives/Swelling): - At this time etiology of hives and swelling is unknown. Hives can be caused by a variety of different triggers including illness/infection, pressure, vibrations, extremes of temperature to name a few however majority of the time there is no identifiable trigger.  - Use Allegra 180mg  twice daily.  If hives are well controlled for 2 weeks, reduce to Allegra 180mg  daily.  If hives worsen, go back to Zyrtec 10mg  in evening and Xyzal 5mg  in daytime. - If hives flare up despite that, add Pepcid  20mg  twice daily.  Chronic Rhinitis: - Use nasal saline rinses before nose sprays such as with Neilmed Sinus Rinse.  Use distilled water .   - Use Allegra 180 mg daily. Dosing based on hives.

## 2024-04-06 LAB — ALLERGENS W/TOTAL IGE AREA 2

## 2024-04-07 ENCOUNTER — Ambulatory Visit: Payer: Self-pay | Admitting: Internal Medicine

## 2024-06-03 ENCOUNTER — Ambulatory Visit: Admitting: Internal Medicine
